# Patient Record
Sex: Male | Born: 1945 | Race: White | Hispanic: No | Marital: Married | Smoking: Former smoker
Health system: Southern US, Community
[De-identification: ages and names within clinical notes are randomized; demographics above are authoritative.]

## PROBLEM LIST (undated history)

## (undated) ENCOUNTER — Ambulatory Visit (HOSPITAL_BASED_OUTPATIENT_CLINIC_OR_DEPARTMENT_OTHER): Source: Home / Self Care

## (undated) DIAGNOSIS — N281 Cyst of kidney, acquired: Secondary | ICD-10-CM

## (undated) DIAGNOSIS — I739 Peripheral vascular disease, unspecified: Secondary | ICD-10-CM

## (undated) DIAGNOSIS — C44301 Unspecified malignant neoplasm of skin of nose: Secondary | ICD-10-CM

## (undated) DIAGNOSIS — J449 Chronic obstructive pulmonary disease, unspecified: Secondary | ICD-10-CM

## (undated) DIAGNOSIS — M1990 Unspecified osteoarthritis, unspecified site: Secondary | ICD-10-CM

## (undated) DIAGNOSIS — J45909 Unspecified asthma, uncomplicated: Secondary | ICD-10-CM

## (undated) DIAGNOSIS — E7800 Pure hypercholesterolemia, unspecified: Secondary | ICD-10-CM

## (undated) DIAGNOSIS — I2510 Atherosclerotic heart disease of native coronary artery without angina pectoris: Secondary | ICD-10-CM

## (undated) DIAGNOSIS — E538 Deficiency of other specified B group vitamins: Secondary | ICD-10-CM

## (undated) HISTORY — PX: OTHER SURGICAL HISTORY: SHX169

## (undated) HISTORY — PX: NO PAST SURGERIES: SHX2092

## (undated) HISTORY — PX: COLONOSCOPY W/ POLYPECTOMY: SHX1380

## (undated) MED FILL — Etoposide Inj 1 GM/50ML (20 MG/ML): INTRAVENOUS | Qty: 10 | Status: AC

---
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
## 2013-06-26 ENCOUNTER — Ambulatory Visit (INDEPENDENT_AMBULATORY_CARE_PROVIDER_SITE_OTHER): Payer: Medicare Other | Admitting: Internal Medicine

## 2013-06-26 ENCOUNTER — Encounter: Payer: Self-pay | Admitting: Internal Medicine

## 2013-06-26 VITALS — BP 128/66 | HR 61 | Temp 98.2°F | Ht 68.0 in | Wt 187.8 lb

## 2013-06-26 DIAGNOSIS — J45909 Unspecified asthma, uncomplicated: Secondary | ICD-10-CM

## 2013-06-26 DIAGNOSIS — J4520 Mild intermittent asthma, uncomplicated: Secondary | ICD-10-CM

## 2013-06-26 MED ORDER — BUDESONIDE-FORMOTEROL FUMARATE 160-4.5 MCG/ACT IN AERO: INHALATION_SPRAY | RESPIRATORY_TRACT | Status: DC

## 2013-06-26 NOTE — Patient Instructions (Addendum)
symbicort 160 Take 2 puffs first thing in am and then another 2 puffs about 12 hours later.   Only use your albuterol (ventolin) as a rescue medication to be used if you can't catch your breath by resting or doing a relaxed purse lip breathing pattern. The less you use it, the better it will work when you need it. Ok to use up to every 4h but the goal is not to need it at all  Work on inhaler technique:  relax and gently blow all the way out then take a nice smooth deep breath back in, triggering the inhaler at same time you start breathing in.  Hold for up to 5 seconds if you can.  Rinse and gargle with water when done  Finish prednisone  Try prilosec 20mg   Take 30-60 min before first meal of the day and Pepcid 20 mg one bedtime until cough is completely gone  GERD (REFLUX)  is an extremely common cause of respiratory symptoms, many times with no significant heartburn at all.    It can be treated with medication, but also with lifestyle changes including avoidance of late meals, excessive alcohol, smoking cessation, and avoid fatty foods, chocolate, peppermint, colas, red wine, and acidic juices such as orange juice.  NO MINT OR MENTHOL PRODUCTS SO NO COUGH DROPS  USE SUGARLESS CANDY INSTEAD (jolley ranchers or Stover's)  NO OIL BASED VITAMINS - use powdered substitutes.   Please schedule a follow up office visit in 2 weeks, sooner if needed to see Tammy with all your medications and then she'll set up a visit with PFT's

## 2013-06-26 NOTE — Progress Notes (Signed)
  Subjective:    Patient ID: Manuel Sanchez, male    DOB: Nov 23, 1945, 67 y.o.   MRN: [id number]  HPI  67 yowm quit smoking 2004 with variable sob since jan 2014 referred to Pulmonary clinic 06/26/2013 by Dr Gillis Ends.  Primary is Dr Shary Decamp  06/26/2013 1st pulmonary cc dx flu  at [facility] admit x 3 days in January did ok but still felt needed occ saba x yardwork or uphill walking  Then abruptly  8/2 after worked hard on roof p went to bed that night developed  bad cough, clear mucus , no better with saba > to UC  Sun 8/3 10 am rx zpak, pred, saba q4h until wheezing stops last used 3 am day of ov p woke up coughing.  No excess or purulent sputum.  Sob just with exertion or cough  No obvious daytime variabilty or assoc  r cp or chest tightness, subjective wheeze overt sinus or hb symptoms. No unusual exp hx or h/o childhood pna/ asthma or knowledge of premature birth.   Sleeping ok without nocturnal  or early am exacerbation  of respiratory  c/o's or need for noct saba. Also denies any obvious fluctuation of symptoms with weather or environmental changes or other aggravating or alleviating factors except as outlined above   Review of Systems  Constitutional: Negative for fever, chills, activity change, appetite change and unexpected weight change.  HENT: Negative for congestion, sore throat, rhinorrhea, sneezing, trouble swallowing, dental problem, voice change and postnasal drip.   Eyes: Negative for visual disturbance.  Respiratory: Positive for cough and shortness of breath. Negative for choking.   Cardiovascular: Negative for chest pain and leg swelling.  Gastrointestinal: Negative for nausea, vomiting and abdominal pain.  Genitourinary: Negative for difficulty urinating.  Musculoskeletal: Negative for arthralgias.  Skin: Negative for rash.  Psychiatric/Behavioral: Negative for behavioral problems and confusion.       Objective:   Physical Exam   Amb wm nad  Wt Readings from Last 3  Encounters:  06/26/13 187 lb 12.8 oz (85.186 kg)    HEENT mild turbinate edema.  Oropharynx no thrush or excess pnd or cobblestoning.  No JVD or cervical adenopathy. Mild accessory muscle hypertrophy. Trachea midline, nl thryroid. Chest was hyperinflated by percussion with diminished breath sounds and moderate increased exp time with mid bilateral exp wheeze. Hoover sign positive at mid inspiration. Regular rate and rhythm without murmur gallop or rub or increase P2 or edema.  Abd: no hsm, nl excursion. Ext warm without cyanosis or clubbing.       Assessment & Plan:

## 2013-06-29 DIAGNOSIS — J45909 Unspecified asthma, uncomplicated: Secondary | ICD-10-CM | POA: Insufficient documentation

## 2013-06-29 HISTORY — DX: Unspecified asthma, uncomplicated: J45.909

## 2013-06-29 NOTE — Assessment & Plan Note (Signed)
Appears to have developed asthmatic bronchitis as result of "flu" in Jan 2014 just treated to date with saba but actively wheezing in office  DDX of  difficult airways managment all start with A and  include Adherence, Ace Inhibitors, Acid Reflux, Active Sinus Disease, Alpha 1 Antitripsin deficiency, Anxiety masquerading as Airways dz,  ABPA,  allergy(esp in young), Aspiration (esp in elderly), Adverse effects of DPI,  Active smokers, plus two Bs  = Bronchiectasis and Beta blocker use..and one C= CHF  Adherence is always the initial "prime suspect" and is a multilayered concern that requires a "trust but verify" approach in every patient - starting with knowing how to use medications, especially inhalers, correctly, keeping up with refills and understanding the fundamental difference between maintenance and prns vs those medications only taken for a very short course and then stopped and not refilled. The proper method of use, as well as anticipated side effects, of a metered-dose inhaler are discussed and demonstrated to the patient. Improved effectiveness after extensive coaching during this visit to a level of approximately  75% so start symbicort 160 2 bid pending f/u pfts   ? Acid reflux contributing, esp with such severe noct cough > max rx and diet reviewed

## 2013-07-10 ENCOUNTER — Encounter: Payer: Medicare Other | Admitting: Adult Health

## 2013-07-22 ENCOUNTER — Encounter: Payer: Self-pay | Admitting: Adult Health

## 2013-07-22 ENCOUNTER — Ambulatory Visit (INDEPENDENT_AMBULATORY_CARE_PROVIDER_SITE_OTHER): Payer: Medicare Other | Admitting: Adult Health

## 2013-07-22 VITALS — BP 122/72 | HR 58 | Temp 97.2°F | Ht 68.0 in | Wt 185.8 lb

## 2013-07-22 DIAGNOSIS — J45909 Unspecified asthma, uncomplicated: Secondary | ICD-10-CM

## 2013-07-22 NOTE — Assessment & Plan Note (Signed)
Improved control on Symbicort  Patient's medications were reviewed today and patient education was given. Computerized medication calendar was adjusted/completed  Plan  Cont on current regimen  follow up in 3-4 weeks with PFT

## 2013-07-22 NOTE — Progress Notes (Signed)
  Subjective:    Patient ID: Manuel Sanchez, male    DOB: 06-Nov-1946, 67 y.o.   MRN: [id number]  HPI 87 yowm quit smoking 2004 with variable sob since jan 2014 referred to Pulmonary clinic 06/26/2013 by Dr Gillis Ends.  Primary is Dr Shary Decamp  06/26/2013 1st pulmonary cc dx flu  at [facility] admit x 3 days in January did ok but still felt needed occ saba x yardwork or uphill walking  Then abruptly  8/2 after worked hard on roof p went to bed that night developed  bad cough, clear mucus , no better with saba > to UC  Sun 8/3 10 am rx zpak, pred, saba q4h until wheezing stops last used 3 am day of ov p woke up coughing.  No excess or purulent sputum.  Sob just with exertion or cough >>symbicort , PPI/pepcid   07/22/2013 Follow up and Med review  Patient returns for 1 month followup and medication review. Reviewed all his medications. Organized them into a medication calendar with patient education. Appears that he is taking his medications correctly. Patient was seen one month ago for increased shortness, of breath, felt to have some asthmatic flare. He was started on Symbicort , along with a, GERD, prevention regimen. Since last visit. Patient is feeling better, cough is much better. Wheezing has resolved. Able to do more activities outside.   Review of Systems  Constitutional: Negative for fever, chills, activity change, appetite change and unexpected weight change.  HENT: Negative for congestion, sore throat, rhinorrhea, sneezing, trouble swallowing, dental problem, voice change and postnasal drip.   Eyes: Negative for visual disturbance.  Respiratory: Neg  Negative for choking.   Cardiovascular: Negative for chest pain and leg swelling.  Gastrointestinal: Negative for nausea, vomiting and abdominal pain.  Genitourinary: Negative for difficulty urinating.  Musculoskeletal: Negative for arthralgias.  Skin: Negative for rash.  Psychiatric/Behavioral: Negative for behavioral problems and confusion.        Objective:   Physical Exam   Amb wm nad HEENT mild turbinate edema.  Oropharynx no thrush or excess pnd or cobblestoning.  No JVD or cervical adenopathy. Mild accessory muscle hypertrophy. Trachea midline, nl thryroid. Chest was hyperinflated by percussion with diminished breath sounds and moderate increased exp time with no wheezing . Hoover sign positive at mid inspiration. Regular rate and rhythm without murmur gallop or rub or increase P2 or edema.  Abd: no hsm, nl excursion. Ext warm without cyanosis or clubbing.       Assessment & Plan:

## 2013-07-22 NOTE — Patient Instructions (Addendum)
Follow medication calendar closely and bring to each visit. Return with Dr. Sherene Sires in 3-4 weeks with pulmonary function test

## 2013-07-22 NOTE — Addendum Note (Signed)
Addended by: Boone Master E on: 07/22/2013 02:54 PM   Modules accepted: Orders

## 2013-08-21 ENCOUNTER — Ambulatory Visit (INDEPENDENT_AMBULATORY_CARE_PROVIDER_SITE_OTHER): Payer: Medicare Other | Admitting: Internal Medicine

## 2013-08-21 ENCOUNTER — Encounter: Payer: Self-pay | Admitting: Internal Medicine

## 2013-08-21 VITALS — BP 122/60 | HR 60 | Temp 97.4°F | Ht 67.0 in | Wt 190.0 lb

## 2013-08-21 DIAGNOSIS — J45909 Unspecified asthma, uncomplicated: Secondary | ICD-10-CM

## 2013-08-21 LAB — PULMONARY FUNCTION TEST

## 2013-08-21 NOTE — Assessment & Plan Note (Addendum)
-   PFTs 08/21/2013 FEV1  2.52 (82%) ratio 78 and no no change p B2, DLCO 65 corrects to 83%   The proper method of use, as well as anticipated side effects, of a metered-dose inhaler are discussed and demonstrated to the patient. Improved effectiveness after extensive coaching during this visit to a level of approximately  75%  All goals of chronic asthma control met including optimal function and elimination of symptoms with minimal need for rescue therapy on symbicort 160 2 puffs each am and rx for gerd.  Given that his mdi baseline is so poor the gerd rx may be the bigger factor and he should be able to taper the symbicort    Each maintenance medication was reviewed in detail including most importantly the difference between maintenance and as needed and under what circumstances the prns are to be used.  Please see instructions for details which were reviewed in writing and the patient given a copy.    Contingencies discussed in full including contacting this office immediately if not controlling the symptoms using the rule of two's.     Pulmonary f/u is prn

## 2013-08-21 NOTE — Patient Instructions (Addendum)
Ok to reduce the symbicort 160 2 puffs every 12 hours if any respiratory problems  Only use your albuterol as a rescue medication to be used if you can't catch your breath by resting or doing a relaxed purse lip breathing pattern.  - The less you use it, the better it will work when you need it. - Ok to use up to every 4 hours if you must but call for immediate appointment if use goes up over your usual need - Don't leave home without it !!  (think of it like your spare tire for your car)    If you are satisfied with your treatment plan let your doctor know and he/she can either refill your medications or you can return here when your prescription runs out.     If in any way you are not 100% satisfied,  please tell us.  If 100% better, tell your friends!

## 2013-08-21 NOTE — Progress Notes (Signed)
PFT done today. 

## 2013-08-21 NOTE — Progress Notes (Signed)
Subjective:    Patient ID: Manuel Sanchez, male    DOB: 20-Jun-1946  MRN: [id number]  HPI 45 yowm quit smoking 2004 with variable sob since jan 2014 referred to Pulmonary clinic 06/26/2013 by Dr Gillis Ends.  Primary is Dr Shary Decamp  06/26/2013 1st pulmonary cc dx flu  at [facility] admit x 3 days in January did ok but still felt needed occ saba x yardwork or uphill walking  Then abruptly  8/2 after worked hard on roof p went to bed that night developed  bad cough, clear mucus , no better with saba > to UC  Sun 8/3 10 am rx zpak, pred, saba q4h until wheezing stops last used 3 am day of ov p woke up coughing.  No excess or purulent sputum.  Sob just with exertion or cough >>symbicort , PPI/pepcid   07/22/2013 Follow up and Med review  Patient returns for 1 month followup and medication review. Reviewed all his medications. Organized them into a medication calendar with patient education. Appears that he is taking his medications correctly. Patient was seen one month ago for increased shortness, of breath, felt to have some asthmatic flare. He was started on Symbicort , along with a, GERD, prevention regimen. Since last visit. Patient is feeling better, cough is much better. Wheezing has resolved. Able to do more activities outside.  rec No change rx, follow med calendar  08/21/2013 f/u ov/[facility] re: asthma much better - has med calendar but not following it.     He is actually just using the symbicort each am, not the ventolin at all.  No obvious day to day or daytime variabilty or assoc chronic cough or cp or chest tightness, subjective wheeze overt sinus or hb symptoms. No unusual exp hx or h/o childhood pna/ asthma or knowledge of premature birth.  Sleeping ok without nocturnal  or early am exacerbation  of respiratory  c/o's or need for noct saba. Also denies any obvious fluctuation of symptoms with weather or environmental changes or other aggravating or alleviating factors except as outlined above    Current Medications, Allergies, Complete Past Medical History, Past Surgical History, Family History, and Social History were reviewed in [employer] record.  ROS  The following are not active complaints unless bolded sore throat, dysphagia, dental problems, itching, sneezing,  nasal congestion or excess/ purulent secretions, ear ache,   fever, chills, sweats, unintended wt loss, pleuritic or exertional cp, hemoptysis,  orthopnea pnd or leg swelling, presyncope, palpitations, heartburn, abdominal pain, anorexia, nausea, vomiting, diarrhea  or change in bowel or urinary habits, change in stools or urine, dysuria,hematuria,  rash, arthralgias, visual complaints, headache, numbness weakness or ataxia or problems with walking or coordination,  change in mood/affect or memory.          Objective:   Physical Exam   Wt Readings from Last 3 Encounters:  08/21/13 190 lb (86.183 kg)  07/22/13 185 lb 12.8 oz (84.278 kg)  06/26/13 187 lb 12.8 oz (85.186 kg)      HEENT: nl dentition, turbinates, and orophanx. Nl external ear canals without cough reflex   NECK :  without JVD/Nodes/TM/ nl carotid upstrokes bilaterally   LUNGS: no acc muscle use, clear to A and P bilaterally without cough on insp or exp maneuvers   CV:  RRR  no s3 or murmur or increase in P2, no edema   ABD:  soft and nontender with nl excursion in the supine position. No bruits or organomegaly, bowel sounds  nl  MS:  warm without deformities, calf tenderness, cyanosis or clubbing  SKIN: warm and dry without lesions    NEURO:  alert, approp, no deficits            Assessment & Plan:

## 2015-04-22 DIAGNOSIS — J410 Simple chronic bronchitis: Secondary | ICD-10-CM

## 2015-04-22 DIAGNOSIS — E785 Hyperlipidemia, unspecified: Secondary | ICD-10-CM

## 2015-04-22 DIAGNOSIS — I2510 Atherosclerotic heart disease of native coronary artery without angina pectoris: Secondary | ICD-10-CM | POA: Insufficient documentation

## 2015-04-22 HISTORY — DX: Atherosclerotic heart disease of native coronary artery without angina pectoris: I25.10

## 2015-04-22 HISTORY — DX: Hyperlipidemia, unspecified: E78.5

## 2015-04-22 HISTORY — DX: Simple chronic bronchitis: J41.0

## 2016-04-24 DIAGNOSIS — R5383 Other fatigue: Secondary | ICD-10-CM | POA: Insufficient documentation

## 2016-04-24 DIAGNOSIS — E7800 Pure hypercholesterolemia, unspecified: Secondary | ICD-10-CM | POA: Insufficient documentation

## 2016-04-24 DIAGNOSIS — R5381 Other malaise: Secondary | ICD-10-CM

## 2016-04-24 DIAGNOSIS — Z79899 Other long term (current) drug therapy: Secondary | ICD-10-CM

## 2016-04-24 DIAGNOSIS — E66811 Obesity, class 1: Secondary | ICD-10-CM

## 2016-04-24 DIAGNOSIS — I739 Peripheral vascular disease, unspecified: Secondary | ICD-10-CM | POA: Insufficient documentation

## 2016-04-24 DIAGNOSIS — E6609 Other obesity due to excess calories: Secondary | ICD-10-CM | POA: Insufficient documentation

## 2016-04-24 DIAGNOSIS — Z6830 Body mass index (BMI) 30.0-30.9, adult: Secondary | ICD-10-CM

## 2016-04-24 DIAGNOSIS — N281 Cyst of kidney, acquired: Secondary | ICD-10-CM | POA: Insufficient documentation

## 2016-04-24 DIAGNOSIS — R7303 Prediabetes: Secondary | ICD-10-CM

## 2016-04-24 DIAGNOSIS — E538 Deficiency of other specified B group vitamins: Secondary | ICD-10-CM | POA: Insufficient documentation

## 2016-04-24 HISTORY — DX: Other malaise: R53.81

## 2016-04-24 HISTORY — DX: Other fatigue: R53.83

## 2016-04-24 HISTORY — DX: Other obesity due to excess calories: E66.09

## 2016-04-24 HISTORY — DX: Prediabetes: R73.03

## 2016-04-24 HISTORY — DX: Peripheral vascular disease, unspecified: I73.9

## 2016-04-24 HISTORY — DX: Other long term (current) drug therapy: Z79.899

## 2016-04-24 HISTORY — DX: Body mass index (BMI) 30.0-30.9, adult: Z68.30

## 2016-04-24 HISTORY — DX: Obesity, class 1: E66.811

## 2016-08-23 DIAGNOSIS — J449 Chronic obstructive pulmonary disease, unspecified: Secondary | ICD-10-CM | POA: Insufficient documentation

## 2016-08-23 HISTORY — DX: Chronic obstructive pulmonary disease, unspecified: J44.9

## 2017-05-18 DIAGNOSIS — M19011 Primary osteoarthritis, right shoulder: Secondary | ICD-10-CM

## 2017-05-18 HISTORY — DX: Primary osteoarthritis, right shoulder: M19.011

## 2018-02-13 DIAGNOSIS — C44301 Unspecified malignant neoplasm of skin of nose: Secondary | ICD-10-CM | POA: Insufficient documentation

## 2018-06-03 ENCOUNTER — Encounter: Payer: Self-pay | Admitting: Gastroenterology

## 2019-07-23 ENCOUNTER — Other Ambulatory Visit: Payer: Self-pay

## 2019-07-23 ENCOUNTER — Encounter: Payer: Self-pay | Admitting: *Deleted

## 2019-07-23 ENCOUNTER — Ambulatory Visit (INDEPENDENT_AMBULATORY_CARE_PROVIDER_SITE_OTHER): Payer: Medicare HMO | Admitting: Cardiology

## 2019-07-23 VITALS — BP 110/60 | HR 76 | Ht 67.0 in | Wt 189.0 lb

## 2019-07-23 DIAGNOSIS — N281 Cyst of kidney, acquired: Secondary | ICD-10-CM | POA: Insufficient documentation

## 2019-07-23 DIAGNOSIS — J449 Chronic obstructive pulmonary disease, unspecified: Secondary | ICD-10-CM

## 2019-07-23 DIAGNOSIS — E538 Deficiency of other specified B group vitamins: Secondary | ICD-10-CM

## 2019-07-23 DIAGNOSIS — J431 Panlobular emphysema: Secondary | ICD-10-CM | POA: Diagnosis not present

## 2019-07-23 DIAGNOSIS — I2510 Atherosclerotic heart disease of native coronary artery without angina pectoris: Secondary | ICD-10-CM

## 2019-07-23 DIAGNOSIS — C44301 Unspecified malignant neoplasm of skin of nose: Secondary | ICD-10-CM | POA: Insufficient documentation

## 2019-07-23 DIAGNOSIS — I739 Peripheral vascular disease, unspecified: Secondary | ICD-10-CM | POA: Insufficient documentation

## 2019-07-23 DIAGNOSIS — M1990 Unspecified osteoarthritis, unspecified site: Secondary | ICD-10-CM

## 2019-07-23 DIAGNOSIS — Z87891 Personal history of nicotine dependence: Secondary | ICD-10-CM

## 2019-07-23 DIAGNOSIS — E7800 Pure hypercholesterolemia, unspecified: Secondary | ICD-10-CM

## 2019-07-23 DIAGNOSIS — R0789 Other chest pain: Secondary | ICD-10-CM

## 2019-07-23 HISTORY — DX: Other chest pain: R07.89

## 2019-07-23 HISTORY — DX: Deficiency of other specified B group vitamins: E53.8

## 2019-07-23 HISTORY — DX: Chronic obstructive pulmonary disease, unspecified: J44.9

## 2019-07-23 HISTORY — DX: Unspecified malignant neoplasm of skin of nose: C44.301

## 2019-07-23 HISTORY — DX: Atherosclerotic heart disease of native coronary artery without angina pectoris: I25.10

## 2019-07-23 HISTORY — DX: Unspecified osteoarthritis, unspecified site: M19.90

## 2019-07-23 HISTORY — DX: Peripheral vascular disease, unspecified: I73.9

## 2019-07-23 HISTORY — DX: Cyst of kidney, acquired: N28.1

## 2019-07-23 HISTORY — DX: Pure hypercholesterolemia, unspecified: E78.00

## 2019-07-23 HISTORY — DX: Personal history of nicotine dependence: Z87.891

## 2019-07-23 NOTE — Progress Notes (Signed)
Cardiology Office Note:    Date:  07/23/2019   ID:  Manuel Sanchez, DOB Nov 30, 1945, MRN [id number]  PCP:  Raina Mina., MD  Cardiologist:  Jenean Lindau, MD   Referring MD: Gweneth Fritter, FNP    ASSESSMENT:    1. Panlobular emphysema (HCC)   2. Chest discomfort   3. Ex-smoker    PLAN:    In order of problems listed above:  1. Primary prevention stressed with the patient.  Importance of compliance with diet and medication stressed and he vocalized understanding. 2. Chest discomfort: Atypical in nature however in view of risk factors we will do a Lexiscan sestamibi.  He knows to go to the nearest emergency room for any concerning symptoms. 3. COPD and history of smoking: I advised the patient never to go back to smoking and he promises never to do so.Patient will be seen in follow-up appointment in 6 months or earlier if the patient has any concerns    Medication Adjustments/Labs and Tests Ordered: Current medicines are reviewed at length with the patient today.  Concerns regarding medicines are outlined above.  No orders of the defined types were placed in this encounter.  No orders of the defined types were placed in this encounter.    History of Present Illness:    Manuel Sanchez is a 73 y.o. male who is being seen today for the evaluation of this discomfort at the request of Goins, Gillis Santa, FNP.  Patient is a pleasant 73 year old male.  He has past medical history of COPD.  Patient is referred here for chest discomfort.  He mentions to me that he had stabbing like sensation a few days ago.  This was not related to exertion.  No radiation to the neck or to the arms.  Overall he leads a sedentary lifestyle but walks about half a mile without any symptoms.  At the time of my evaluation, the patient is alert awake oriented and in no distress.  Past Medical History:  Diagnosis Date  . Arthritis 07/23/2019   Rt acromioclavicular joint  . CAD (coronary artery disease)  07/23/2019  . COPD (chronic obstructive pulmonary disease) ([place]) 07/23/2019  . Cyst of left kidney 07/23/2019  . Hypercholesterolemia 07/23/2019  . 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 Peripheral vascular disease, unspecified ([place]) 07/23/2019  . Skin cancer of nose 07/23/2019  . Vitamin B12 deficiency 07/23/2019    History reviewed. No pertinent surgical history.  Current Medications: Current Meds  Medication Sig  . albuterol (VENTOLIN HFA) 108 (90 BASE) MCG/ACT inhaler Inhale 2 puffs into the lungs every 4 (four) hours as needed for wheezing or shortness of breath.   Marland Kitchen atorvastatin (LIPITOR) 20 MG tablet Take 20 mg by mouth daily.  . budesonide-formoterol (SYMBICORT) 160-4.5 MCG/ACT inhaler Take 2 puffs first thing in am and then another 2 puffs about 12 hours later.  . cholecalciferol (VITAMIN D) 1000 UNITS tablet Take 1,000 Units by mouth daily.  . Cyanocobalamin (B-12) 2500 MCG TABS Take 1 tablet by mouth every morning.  . montelukast (SINGULAIR) 10 MG tablet Take 10 mg by mouth daily.     Allergies:   Codeine and Levaquin [levofloxacin in d5w]   Social History   Socioeconomic History  . Marital status: Married    Spouse name:  Not on file  . Number of children: 4  . Years of education: Not on file  . Highest education level: Not on file  Occupational History  . Occupation: Retired  Scientific laboratory technician  . Financial resource strain: Not on file  . Food insecurity    Worry: Not on file    Inability: Not on file  . Transportation needs    Medical: Not on file    Non-medical: Not on file  Tobacco Use  . Smoking status: Former Smoker    Packs/day: 0.75    Years: 45.00    Pack years: 33.75    Types: Cigarettes    Quit date: 11/20/2002    Years since quitting: 16.6  . Smokeless tobacco: Never Used  Substance and Sexual Activity   . Alcohol use: No  . Drug use: No  . Sexual activity: Not on file  Lifestyle  . Physical activity    Days per week: Not on file    Minutes per session: Not on file  . Stress: Not on file  Relationships  . Social Herbalist on phone: Not on file    Gets together: Not on file    Attends religious service: Not on file    Active member of club or organization: Not on file    Attends meetings of clubs or organizations: Not on file    Relationship status: Not on file  Other Topics Concern  . Not on file  Social History Narrative  . Not on file     Family History: The patient's family history includes Colon cancer in his father; Heart disease in his mother; Stroke in his mother.  ROS:   Please see the history of present illness.    All other systems reviewed and are negative.  EKGs/Labs/Other Studies Reviewed:    The following studies were reviewed today: EKG reveals sinus rhythm and nonspecific ST changes   Recent Labs: No results found for requested labs within last 8760 hours.  Recent Lipid Panel No results found for: CHOL, TRIG, HDL, CHOLHDL, VLDL, LDLCALC, LDLDIRECT  Physical Exam:    VS:  BP 110/60 (BP Location: Right Arm, Patient Position: Sitting, Cuff Size: Normal)   Pulse 76   Ht 5\' 7"  (1.702 m)   Wt 189 lb (85.7 kg)   SpO2 96%   BMI 29.60 kg/m     Wt Readings from Last 3 Encounters:  07/23/19 189 lb (85.7 kg)  08/21/13 190 lb (86.2 kg)  07/22/13 185 lb 12.8 oz (84.3 kg)     GEN: Patient is in no acute distress HEENT: Normal NECK: No JVD; No carotid bruits LYMPHATICS: No lymphadenopathy CARDIAC: S1 S2 regular, 2/6 systolic murmur at the apex. RESPIRATORY:  Clear to auscultation without rales, wheezing or rhonchi  ABDOMEN: Soft, non-tender, non-distended MUSCULOSKELETAL:  No edema; No deformity  SKIN: Warm and dry NEUROLOGIC:  Alert and oriented x 3 PSYCHIATRIC:  Normal affect    Signed, Jenean Lindau, MD  07/23/2019 11:58 AM     [place]

## 2019-07-23 NOTE — Patient Instructions (Signed)
Medication Instructions:  Your physician recommends that you continue on your current medications as directed. Please refer to the Current Medication list given to you today.  If you need a refill on your cardiac medications before your next appointment, please call your pharmacy.   Lab work: NONE If you have labs (blood work) drawn today and your tests are completely normal, you will receive your results only by: . MyChart Message (if you have MyChart) OR . A paper copy in the mail If you have any lab test that is abnormal or we need to change your treatment, we will call you to review the results.  Testing/Procedures: You had an EKG performed today  Your physician has requested that you have a lexiscan myoview. For further information please visit [email or web address]. Please follow instruction sheet, as given.    Follow-Up: At [facility], you and your health needs are our priority.  As part of our continuing mission to provide you with exceptional heart care, we have created designated Provider Care Teams.  These Care Teams include your primary Cardiologist (physician) and Advanced Practice Providers (APPs -  Physician Assistants and Nurse Practitioners) who all work together to provide you with the care you need, when you need it. You will need a follow up appointment in 6 months.   Any Other Special Instructions Will Be Listed Below  Regadenoson injection What is this medicine? REGADENOSON is used to test the heart for coronary artery disease. It is used in patients who can not exercise for their stress test. This medicine may be used for other purposes; ask your health care provider or pharmacist if you have questions. COMMON BRAND NAME(S): Lexiscan What should I tell my health care provider before I take this medicine? They need to know if you have any of these conditions:  heart problems  lung or breathing disease, like asthma or COPD  an unusual or allergic reaction to  regadenoson, other medicines, foods, dyes, or preservatives  pregnant or trying to get pregnant  breast-feeding How should I use this medicine? This medicine is for injection into a vein. It is given by a health care professional in a hospital or clinic setting. Talk to your pediatrician regarding the use of this medicine in children. Special care may be needed. Overdosage: If you think you have taken too much of this medicine contact a poison control center or emergency room at once. NOTE: This medicine is only for you. Do not share this medicine with others. What if I miss a dose? This does not apply. What may interact with this medicine?  caffeine  dipyridamole  guarana  theophylline This list may not describe all possible interactions. Give your health care provider a list of all the medicines, herbs, non-prescription drugs, or dietary supplements you use. Also tell them if you smoke, drink alcohol, or use illegal drugs. Some items may interact with your medicine. What should I watch for while using this medicine? Your condition will be monitored carefully while you are receiving this medicine. Do not take medicines, foods, or drinks with caffeine (like coffee, tea, or colas) for at least 12 hours before your test. If you do not know if something contains caffeine, ask your health care professional. What side effects may I notice from receiving this medicine? Side effects that you should report to your doctor or health care professional as soon as possible:  allergic reactions like skin rash, itching or hives, swelling of the face, lips, or tongue  breathing   problems  chest pain, tightness or palpitations  severe headache Side effects that usually do not require medical attention (report to your doctor or health care professional if they continue or are bothersome):  flushing  headache  irritation or pain at site where injected  nausea, vomiting This list may not  describe all possible side effects. Call your doctor for medical advice about side effects. You may report side effects to FDA at 1-800-FDA-1088. Where should I keep my medicine? This drug is given in a hospital or clinic and will not be stored at home. NOTE: This sheet is a summary. It may not cover all possible information. If you have questions about this medicine, talk to your doctor, pharmacist, or health care provider.  2020 Elsevier/Gold Standard (2008-07-06 15:08:13)   Cardiac Nuclear Scan A cardiac nuclear scan is a test that is done to check the flow of blood to your heart. It is done when you are resting and when you are exercising. The test looks for problems such as:  Not enough blood reaching a portion of the heart.  The heart muscle not working as it should. You may need this test if:  You have heart disease.  You have had lab results that are not normal.  You have had heart surgery or a balloon procedure to open up blocked arteries (angioplasty).  You have chest pain.  You have shortness of breath. In this test, a special dye (tracer) is put into your bloodstream. The tracer will travel to your heart. A camera will then take pictures of your heart to see how the tracer moves through your heart. This test is usually done at a hospital and takes 2-4 hours. Tell a doctor about:  Any allergies you have.  All medicines you are taking, including vitamins, herbs, eye drops, creams, and over-the-counter medicines.  Any problems you or family members have had with anesthetic medicines.  Any blood disorders you have.  Any surgeries you have had.  Any medical conditions you have.  Whether you are pregnant or may be pregnant. What are the risks? Generally, this is a safe test. However, problems may occur, such as:  Serious chest pain and heart attack. This is only a risk if the stress portion of the test is done.  Rapid heartbeat.  A feeling of warmth in your  chest. This feeling usually does not last long.  Allergic reaction to the tracer. What happens before the test?  Ask your doctor about changing or stopping your normal medicines. This is important.  Follow instructions from your doctor about what you cannot eat or drink.  Remove your jewelry on the day of the test. What happens during the test?  An IV tube will be inserted into one of your veins.  Your doctor will give you a small amount of tracer through the IV tube.  You will wait for 20-40 minutes while the tracer moves through your bloodstream.  Your heart will be monitored with an electrocardiogram (ECG).  You will lie down on an exam table.  Pictures of your heart will be taken for about 15-20 minutes.  You may also have a stress test. For this test, one of these things may be done: ? You will be asked to exercise on a treadmill or a stationary bike. ? You will be given medicines that will make your heart work harder. This is done if you are unable to exercise.  When blood flow to your heart has peaked, a   tracer will again be given through the IV tube.  After 20-40 minutes, you will get back on the exam table. More pictures will be taken of your heart.  Depending on the tracer that is used, more pictures may need to be taken 3-4 hours later.  Your IV tube will be removed when the test is over. The test may vary among doctors and hospitals. What happens after the test?  Ask your doctor: ? Whether you can return to your normal schedule, including diet, activities, and medicines. ? Whether you should drink more fluids. This will help to remove the tracer from your body. Drink enough fluid to keep your pee (urine) pale yellow.  Ask your doctor, or the department that is doing the test: ? When will my results be ready? ? How will I get my results? Summary  A cardiac nuclear scan is a test that is done to check the flow of blood to your heart.  Tell your doctor  whether you are pregnant or may be pregnant.  Before the test, ask your doctor about changing or stopping your normal medicines. This is important.  Ask your doctor whether you can return to your normal activities. You may be asked to drink more fluids. This information is not intended to replace advice given to you by your health care provider. Make sure you discuss any questions you have with your health care provider. Document Released: 04/22/2018 Document Revised: 02/26/2019 Document Reviewed: 04/22/2018 Elsevier Patient Education  2020 Elsevier Inc.  

## 2019-08-20 ENCOUNTER — Telehealth (HOSPITAL_COMMUNITY): Payer: Self-pay | Admitting: *Deleted

## 2019-08-20 NOTE — Telephone Encounter (Signed)
Patient given detailed instructions per Myocardial Perfusion Study Information Sheet for the test on 08/26/19. Patient notified to arrive 15 minutes early and that it is imperative to arrive on time for appointment to keep from having the test rescheduled.  If you need to cancel or reschedule your appointment, please call the office within 24 hours of your appointment. . Patient verbalized understanding.Kirstie Peri

## 2019-09-03 ENCOUNTER — Telehealth (HOSPITAL_COMMUNITY): Payer: Self-pay | Admitting: *Deleted

## 2019-09-03 NOTE — Telephone Encounter (Signed)
Patient given detailed instructions per Myocardial Perfusion Study Information Sheet for the test on 09/11/19. Patient notified to arrive 15 minutes early and that it is imperative to arrive on time for appointment to keep from having the test rescheduled.  If you need to cancel or reschedule your appointment, please call the office within 24 hours of your appointment. . Patient verbalized understanding. Kirstie Peri

## 2019-09-10 DIAGNOSIS — D509 Iron deficiency anemia, unspecified: Secondary | ICD-10-CM

## 2019-09-10 DIAGNOSIS — L308 Other specified dermatitis: Secondary | ICD-10-CM | POA: Insufficient documentation

## 2019-09-10 DIAGNOSIS — R918 Other nonspecific abnormal finding of lung field: Secondary | ICD-10-CM | POA: Insufficient documentation

## 2019-09-10 HISTORY — DX: Iron deficiency anemia, unspecified: D50.9

## 2019-09-10 HISTORY — DX: Other specified dermatitis: L30.8

## 2019-09-10 HISTORY — DX: Other nonspecific abnormal finding of lung field: R91.8

## 2019-09-11 ENCOUNTER — Ambulatory Visit (INDEPENDENT_AMBULATORY_CARE_PROVIDER_SITE_OTHER): Payer: Medicare HMO

## 2019-09-11 ENCOUNTER — Other Ambulatory Visit: Payer: Self-pay

## 2019-09-11 DIAGNOSIS — R0789 Other chest pain: Secondary | ICD-10-CM

## 2019-09-11 LAB — MYOCARDIAL PERFUSION IMAGING
LV dias vol: 97 mL (ref 62–150)
LV sys vol: 41 mL
Peak HR: 78 {beats}/min
Rest HR: 54 {beats}/min
SDS: 3
SRS: 1
SSS: 4
TID: 1.17

## 2019-09-11 MED ORDER — REGADENOSON 0.4 MG/5ML IV SOLN
0.4000 mg | Freq: Once | INTRAVENOUS | Status: AC
  Administered 2019-09-11: 0.4 mg via INTRAVENOUS

## 2019-09-11 MED ORDER — TECHNETIUM TC 99M TETROFOSMIN IV KIT
10.6000 | PACK | Freq: Once | INTRAVENOUS | Status: AC | PRN
  Administered 2019-09-11: 10.6 via INTRAVENOUS

## 2019-09-11 MED ORDER — TECHNETIUM TC 99M TETROFOSMIN IV KIT
30.7000 | PACK | Freq: Once | INTRAVENOUS | Status: AC | PRN
  Administered 2019-09-11: 30.7 via INTRAVENOUS

## 2019-09-22 ENCOUNTER — Telehealth: Payer: Self-pay | Admitting: Cardiology

## 2019-09-22 NOTE — Telephone Encounter (Signed)
Wants nuc results

## 2019-09-23 ENCOUNTER — Telehealth: Payer: Self-pay

## 2019-09-23 NOTE — Telephone Encounter (Signed)
Patient states he does not hear well and wants results given to his wife whom is not available. Will try later.

## 2019-10-01 MED ORDER — ASPIRIN EC 81 MG PO TBEC: 81.0000 mg | DELAYED_RELEASE_TABLET | Freq: Every day | ORAL | 3 refills | Status: DC

## 2019-10-01 MED ORDER — NITROGLYCERIN 0.4 MG SL SUBL: 0.4000 mg | SUBLINGUAL_TABLET | SUBLINGUAL | 3 refills | Status: DC | PRN

## 2019-10-01 NOTE — Telephone Encounter (Signed)
Results relayed to spouse. Medications sent. Scheduled for f/u with Dr. 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

## 2019-10-01 NOTE — Addendum Note (Signed)
Addended by: Beckey Rutter on: 10/01/2019 10:16 AM   Modules accepted: Orders

## 2019-10-20 ENCOUNTER — Other Ambulatory Visit: Payer: Self-pay

## 2019-10-20 ENCOUNTER — Ambulatory Visit (INDEPENDENT_AMBULATORY_CARE_PROVIDER_SITE_OTHER): Payer: Medicare HMO | Admitting: Cardiology

## 2019-10-20 ENCOUNTER — Encounter: Payer: Self-pay | Admitting: Cardiology

## 2019-10-20 VITALS — BP 122/58 | HR 58 | Ht 68.0 in | Wt 195.8 lb

## 2019-10-20 DIAGNOSIS — R079 Chest pain, unspecified: Secondary | ICD-10-CM | POA: Diagnosis not present

## 2019-10-20 DIAGNOSIS — I2510 Atherosclerotic heart disease of native coronary artery without angina pectoris: Secondary | ICD-10-CM

## 2019-10-20 DIAGNOSIS — R9439 Abnormal result of other cardiovascular function study: Secondary | ICD-10-CM

## 2019-10-20 DIAGNOSIS — R9431 Abnormal electrocardiogram [ECG] [EKG]: Secondary | ICD-10-CM | POA: Diagnosis not present

## 2019-10-20 DIAGNOSIS — E7800 Pure hypercholesterolemia, unspecified: Secondary | ICD-10-CM

## 2019-10-20 DIAGNOSIS — Z87891 Personal history of nicotine dependence: Secondary | ICD-10-CM

## 2019-10-20 HISTORY — DX: Abnormal result of other cardiovascular function study: R94.39

## 2019-10-20 MED ORDER — METOPROLOL TARTRATE 50 MG PO TABS: 100.0000 mg | ORAL_TABLET | Freq: Once | ORAL | 0 refills | Status: DC

## 2019-10-20 NOTE — Patient Instructions (Addendum)
Medication Instructions:  Your physician recommends that you continue on your current medications as directed. Please refer to the Current Medication list given to you today.  If you need a refill on your cardiac medications before your next appointment, please call your pharmacy.   Lab work: You will need to come in 7 days prior to procedure to have a BMP drawn.  If you have labs (blood work) drawn today and your tests are completely normal, you will receive your results only by:  [place] (if you have MyChart) OR  A paper copy in the mail If you have any lab test that is abnormal or we need to change your treatment, we will call you to review the results.  Testing/Procedures: Non-Cardiac CT scanning, (CAT scanning), is a noninvasive, special x-ray that produces cross-sectional images of the body using x-rays and a computer. CT scans help physicians diagnose and treat medical conditions. For some CT exams, a contrast material is used to enhance visibility in the area of the body being studied. CT scans provide greater clarity and reveal more details than regular x-ray exams.  Please arrive at the [facility] main entrance of [facility] at xx:xx AM (30-45 minutes prior to test start time)  [facility] [place], [place] [address] [phone]  Proceed to the [facility] Radiology Department (First Floor).  Please follow these instructions carefully (unless otherwise directed):  Hold all erectile dysfunction medications at least 48 hours prior to test.  On the Night Before the Test:  Be sure to Drink plenty of water.  Do not consume any caffeinated/decaffeinated beverages or chocolate 12 hours prior to your test.  Do not take any antihistamines 12 hours prior to your test.   On the Day of the Test:  Drink plenty of water. Do not drink any water within one hour of the test.  Do not eat any food 4 hours prior to the test.  You may  take your regular medications prior to the test.   Take metoprolol (Lopressor) two hours prior to test.      -IF HR is greater than 55 BPM and patient is less than or equal to 71 yrs old Lopressor 100mg  x1. After the Test:  Drink plenty of water.  After receiving IV contrast, you may experience a mild flushed feeling. This is normal.  On occasion, you may experience a mild rash up to 24 hours after the test. This is not dangerous. If this occurs, you can take Benadryl 25 mg and increase your fluid intake.  If you experience trouble breathing, this can be serious. If it is severe call 911 IMMEDIATELY. If it is mild, please call our office.  If you take any of these medications: Glipizide/Metformin, Avandament, Glucavance, please do not take 48 hours after completing test.   Follow-Up: At [facility], you and your health needs are our priority.  As part of our continuing mission to provide you with exceptional heart care, we have created designated Provider Care Teams.  These Care Teams include your primary Cardiologist (physician) and Advanced Practice Providers (APPs -  Physician Assistants and Nurse Practitioners) who all work together to provide you with the care you need, when you need it. You will need a follow up appointment in 6 months.    Any Other Special Instructions Will Be Listed Below  Metoprolol tablets What is this medicine? METOPROLOL (me TOE proe lole) is a beta-blocker. Beta-blockers reduce the workload on  the heart and help it to beat more regularly. This medicine is used to treat high blood pressure and to prevent chest pain. It is also used to after a heart attack and to prevent an additional heart attack from occurring. This medicine may be used for other purposes; ask your health care provider or pharmacist if you have questions. COMMON BRAND NAME(S): Lopressor What should I tell my health care provider before I take this medicine? They need to know if you have any  of these conditions: -diabetes -heart or vessel disease like slow heart rate, worsening heart failure, heart block, sick sinus syndrome or Raynaud's disease -kidney disease -liver disease -lung or breathing disease, like asthma or emphysema -pheochromocytoma -thyroid disease -an unusual or allergic reaction to metoprolol, other beta-blockers, medicines, foods, dyes, or preservatives -pregnant or trying to get pregnant -breast-feeding How should I use this medicine? Take this medicine by mouth with a drink of water. Follow the directions on the prescription label. Take this medicine immediately after meals. Take your doses at regular intervals. Do not take more medicine than directed. Do not stop taking this medicine suddenly. This could lead to serious heart-related effects. Talk to your pediatrician regarding the use of this medicine in children. Special care may be needed. Overdosage: If you think you have taken too much of this medicine contact a poison control center or emergency room at once. NOTE: This medicine is only for you. Do not share this medicine with others. What if I miss a dose? If you miss a dose, take it as soon as you can. If it is almost time for your next dose, take only that dose. Do not take double or extra doses. What may interact with this medicine? This medicine may interact with the following medications: -certain medicines for blood pressure, heart disease, irregular heart beat -certain medicines for depression like monoamine oxidase (MAO) inhibitors, fluoxetine, or paroxetine -clonidine -dobutamine -epinephrine -isoproterenol -reserpine This list may not describe all possible interactions. Give your health care provider a list of all the medicines, herbs, non-prescription drugs, or dietary supplements you use. Also tell them if you smoke, drink alcohol, or use illegal drugs. Some items may interact with your medicine. What should I watch for while using this  medicine? Visit your doctor or health care professional for regular check ups. Contact your doctor right away if your symptoms worsen. Check your blood pressure and pulse rate regularly. Ask your health care professional what your blood pressure and pulse rate should be, and when you should contact them. You may get drowsy or dizzy. Do not drive, use machinery, or do anything that needs mental alertness until you know how this medicine affects you. Do not sit or stand up quickly, especially if you are an older patient. This reduces the risk of dizzy or fainting spells. Contact your doctor if these symptoms continue. Alcohol may interfere with the effect of this medicine. Avoid alcoholic drinks. What side effects may I notice from receiving this medicine? Side effects that you should report to your doctor or health care professional as soon as possible: -allergic reactions like skin rash, itching or hives -cold or numb hands or feet -depression -difficulty breathing -faint -fever with sore throat -irregular heartbeat, chest pain -rapid weight gain -swollen legs or ankles Side effects that usually do not require medical attention (report to your doctor or health care professional if they continue or are bothersome): -anxiety or nervousness -change in sex drive or performance -dry skin -headache -  nightmares or trouble sleeping -short term memory loss -stomach upset or diarrhea -unusually tired This list may not describe all possible side effects. Call your doctor for medical advice about side effects. You may report side effects to FDA at 1-800-FDA-1088. Where should I keep my medicine? Keep out of the reach of children. Store at room temperature between 15 and 30 degrees C (59 and 86 degrees F). Throw away any unused medicine after the expiration date. NOTE: This sheet is a summary. It may not cover all possible information. If you have questions about this medicine, talk to your doctor,  pharmacist, or health care provider.  2019 Elsevier/Gold Standard (2013-07-11 14:40:36)   Coronary Angiogram A coronary angiogram is an X-ray procedure that is used to examine the arteries in the heart. In this procedure, a dye (contrast dye) is injected through a long, thin tube (catheter). The catheter is inserted through the groin, wrist, or arm. The dye is injected into each artery, then X-rays are taken to show if there is a blockage in the arteries of the heart. This procedure can also show if you have valve disease or a disease of the aorta, and it can be used to check the overall function of your heart muscle. You may have a coronary angiogram if:  You are having chest pain, or other symptoms of angina, and you are at risk for heart disease.  You have an abnormal electrocardiogram (ECG) or stress test.  You have chest pain and heart failure.  You are having irregular heart rhythms.  You and your health care provider determine that the benefits of the test information outweigh the risks of the procedure. Let your health care provider know about:  Any allergies you have, including allergies to contrast dye.  All medicines you are taking, including vitamins, herbs, eye drops, creams, and over-the-counter medicines.  Any problems you or family members have had with anesthetic medicines.  Any blood disorders you have.  Any surgeries you have had.  History of kidney problems or kidney failure.  Any medical conditions you have.  Whether you are pregnant or may be pregnant. What are the risks? Generally, this is a safe procedure. However, problems may occur, including:  Infection.  Allergic reaction to medicines or dyes that are used.  Bleeding from the access site or other locations.  Kidney injury, especially in people with impaired kidney function.  Stroke (rare).  Heart attack (rare).  Damage to other structures or organs. What happens before the  procedure? Staying hydrated Follow instructions from your health care provider about hydration, which may include:  Up to 2 hours before the procedure - you may continue to drink clear liquids, such as water, clear fruit juice, black coffee, and plain tea. Eating and drinking restrictions Follow instructions from your health care provider about eating and drinking, which may include:  8 hours before the procedure - stop eating heavy meals or foods such as meat, fried foods, or fatty foods.  6 hours before the procedure - stop eating light meals or foods, such as toast or cereal.  2 hours before the procedure - stop drinking clear liquids. General instructions  Ask your health care provider about: ? Changing or stopping your regular medicines. This is especially important if you are taking diabetes medicines or blood thinners. ? Taking medicines such as ibuprofen. These medicines can thin your blood. Do not take these medicines before your procedure if your health care provider instructs you not to, though aspirin  may be recommended prior to coronary angiograms.  Plan to have someone take you home from the hospital or clinic.  You may need to have blood tests or X-rays done. What happens during the procedure?  An IV tube will be inserted into one of your veins.  You will be given one or more of the following: ? A medicine to help you relax (sedative). ? A medicine to numb the area where the catheter will be inserted into an artery (local anesthetic).  To reduce your risk of infection: ? Your health care team will wash or sanitize their hands. ? Your skin will be washed with soap. ? Hair may be removed from the area where the catheter will be inserted.  You will be connected to a continuous ECG monitor.  The catheter will be inserted into an artery. The location may be in your groin, in your wrist, or in the fold of your arm (near your elbow).  A type of X-ray (fluoroscopy) will  be used to help guide the catheter to the opening of the blood vessel that is being examined.  A dye will be injected into the catheter, and X-rays will be taken. The dye will help to show where any narrowing or blockages are located in the heart arteries.  Tell your health care provider if you have any chest pain or trouble breathing during the procedure.  If blockages are found, your health care provider may perform another procedure, such as inserting a coronary stent. The procedure may vary among health care providers and hospitals. What happens after the procedure?  After the procedure, you will need to keep the area still for a few hours, or for as long as told by your health care provider. If the procedure is done through the groin, you will be instructed to not bend and not cross your legs.  The insertion site will be checked frequently.  The pulse in your foot or wrist will be checked frequently.  You may have additional blood tests, X-rays, and a test that records the electrical activity of your heart (ECG).  Do not drive for 24 hours if you were given a sedative. Summary  A coronary angiogram is an X-ray procedure that is used to look into the arteries in the heart.  During the procedure, a dye (contrast dye) is injected through a long, thin tube (catheter). The catheter is inserted through the groin, wrist, or arm.  Tell your health care provider about any allergies you have, including allergies to contrast dye.  After the procedure, you will need to keep the area still for a few hours, or for as long as told by your health care provider. This information is not intended to replace advice given to you by your health care provider. Make sure you discuss any questions you have with your health care provider. Document Released: 05/13/2003 Document Revised: 08/18/2016 Document Reviewed: 08/18/2016 Elsevier Interactive Patient Education  2019 [employer].

## 2019-10-20 NOTE — Progress Notes (Signed)
Cardiology Office Note:    Date:  10/20/2019   ID:  Manuel Sanchez, DOB 05-30-1946, MRN [id number]  PCP:  Raina Mina., MD  Cardiologist:  Jenean Lindau, MD   Referring MD: Raina Mina., MD    ASSESSMENT:    1. Abnormal nuclear stress test   2. Abnormal electrocardiogram   3. Chest pain, unspecified type   4. Coronary artery disease involving native coronary artery of native heart without angina pectoris   5. Hypercholesterolemia   6. Ex-smoker    PLAN:    In order of problems listed above:  1. Chest discomfort: Coronary artery disease: Patient has known coronary artery disease by findings on CT scans of coronary calcifications in the past.  Unfortunately he leads a sedentary lifestyle.  Report of the stress test was discussed with the patient at extensive length.  Questions were answered to his and his wife satisfaction.  Total time for this evaluation was 30 minutes.  In view of these findings and risk factors I mentioned to him about coronary angiography conventional and CT scanning based.  He prefers the latter.  I respect his wishes.  Procedure, benefits and potential risks were explained extensively and he vocalized understanding.  Sublingual nitroglycerin prescription was sent, its protocol and 911 protocol explained and the patient vocalized understanding questions were answered to the patient's satisfaction 2. Essential hypertension: Blood pressure is stable 3. Mixed dyslipidemia: Lipids followed by primary care physician.  Further recommendations will be made based on the findings of the aforementioned test.  He knows to go to the nearest emergency room for any concerning symptoms.  Patient does use a coated aspirin on a daily basis.   Medication Adjustments/Labs and Tests Ordered: Current medicines are reviewed at length with the patient today.  Concerns regarding medicines are outlined above.  Orders Placed This Encounter  Procedures  . CT CORONARY MORPH W/CTA  COR W/SCORE W/CA W/CM &/OR WO/CM  . CT CORONARY FRACTIONAL FLOW RESERVE DATA PREP  . CT CORONARY FRACTIONAL FLOW RESERVE FLUID ANALYSIS  . Basic Metabolic Panel (BMET)  . EKG 12-Lead   Meds ordered this encounter  Medications  . metoprolol tartrate (LOPRESSOR) 50 MG tablet    Sig: Take 2 tablets (100 mg total) by mouth once for 1 dose. IF HR is 55 or greater, take 2 tablets 2 hours prior to procedure    Dispense:  2 tablet    Refill:  0     No chief complaint on file.    History of Present Illness:    Manuel Sanchez is a 73 y.o. male.  Patient has past medical history of of essential hypertension and dyslipidemia.  He has coronary calcifications on CT scans done in the past.  He was evaluated for chest discomfort.  Stress test is abnormal and therefore he is here for follow-up.  He leads a sedentary lifestyle.  He denies any chest pain orthopnea or PND.  He tells me that he does not get under any stress.  He mentions to me that he is not sexually active so trying to figure out with his symptoms are aggravated by physical exertion is very difficult.  At the time of my evaluation, the patient is alert awake oriented and in no distress.  Past Medical History:  Diagnosis Date  . Arthritis 07/23/2019   Rt acromioclavicular joint  . CAD (coronary artery disease) 07/23/2019  . COPD (chronic obstructive pulmonary disease) ([place]) 07/23/2019  . Cyst of left kidney 07/23/2019  .  Hypercholesterolemia 07/23/2019  . 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 Peripheral vascular disease, unspecified ([place]) 07/23/2019  . Skin cancer of nose 07/23/2019  . Vitamin B12 deficiency 07/23/2019    History reviewed. No pertinent surgical history.  Current Medications: Current Meds  Medication Sig  . albuterol (VENTOLIN HFA) 108 (90 BASE) MCG/ACT inhaler Inhale  2 puffs into the lungs every 4 (four) hours as needed for wheezing or shortness of breath.   Marland Kitchen aspirin EC 81 MG tablet Take 1 tablet (81 mg total) by mouth daily.  Marland Kitchen atorvastatin (LIPITOR) 20 MG tablet Take 20 mg by mouth daily.  . budesonide-formoterol (SYMBICORT) 160-4.5 MCG/ACT inhaler Take 2 puffs first thing in am and then another 2 puffs about 12 hours later.  . cholecalciferol (VITAMIN D) 1000 UNITS tablet Take 1,000 Units by mouth daily.  . Cyanocobalamin (B-12) 2500 MCG TABS Take 1 tablet by mouth every morning.  . montelukast (SINGULAIR) 10 MG tablet Take 10 mg by mouth daily.  . nitroGLYCERIN (NITROSTAT) 0.4 MG SL tablet Place 1 tablet (0.4 mg total) under the tongue every 5 (five) minutes as needed. Take as needed for chest pain     Allergies:   Codeine and Levaquin [levofloxacin in d5w]   Social History   Socioeconomic History  . Marital status: Married    Spouse name: Not on file  . Number of children: 4  . Years of education: Not on file  . Highest education level: Not on file  Occupational History  . Occupation: Retired  Scientific laboratory technician  . Financial resource strain: Not on file  . Food insecurity    Worry: Not on file    Inability: Not on file  . Transportation needs    Medical: Not on file    Non-medical: Not on file  Tobacco Use  . Smoking status: Former Smoker    Packs/day: 0.75    Years: 45.00    Pack years: 33.75    Types: Cigarettes    Quit date: 11/20/2002    Years since quitting: 16.9  . Smokeless tobacco: Never Used  Substance and Sexual Activity  . Alcohol use: No  . Drug use: No  . Sexual activity: Not on file  Lifestyle  . Physical activity    Days per week: Not on file    Minutes per session: Not on file  . Stress: Not on file  Relationships  . Social Herbalist on phone: Not on file    Gets together: Not on file    Attends religious service: Not on file    Active member of club or organization: Not on file    Attends meetings of  clubs or organizations: Not on file    Relationship status: Not on file  Other Topics Concern  . Not on file  Social History Narrative  . Not on file     Family History: The patient's family history includes Colon cancer in his father; Heart disease in his mother; Stroke in his mother.  ROS:   Please see the history of present illness.    All other systems reviewed and are negative.  EKGs/Labs/Other Studies Reviewed:    The following studies were reviewed today:  EKG reveals sinus rhythm and nonspecific ST-T changes.   Study Highlights  The left ventricular ejection fraction is normal (55-65%).  Nuclear stress EF: 58%.  There was no ST segment deviation noted during stress.  No T wave inversion was noted during stress.  Defect 1: There is a small defect of mild severity present in the mid inferior location. This defect is reversible with normal wall.  Findings consistent with ischemia.  This is a low risk study.      Recent Labs: No results found for requested labs within last 8760 hours.  Recent Lipid Panel No results found for: CHOL, TRIG, HDL, CHOLHDL, VLDL, LDLCALC, LDLDIRECT  Physical Exam:    VS:  BP (!) 122/58 (BP Location: Left Arm, Patient Position: Sitting, Cuff Size: Normal)   Pulse (!) 58   Ht 5\' 8"  (1.727 m)   Wt 195 lb 12.8 oz (88.8 kg)   SpO2 96%   BMI 29.77 kg/m     Wt Readings from Last 3 Encounters:  10/20/19 195 lb 12.8 oz (88.8 kg)  09/11/19 189 lb (85.7 kg)  07/23/19 189 lb (85.7 kg)     GEN: Patient is in no acute distress HEENT: Normal NECK: No JVD; No carotid bruits LYMPHATICS: No lymphadenopathy CARDIAC: Hear sounds regular, 2/6 systolic murmur at the apex. RESPIRATORY:  Clear to auscultation without rales, wheezing or rhonchi  ABDOMEN: Soft, non-tender, non-distended MUSCULOSKELETAL:  No edema; No deformity  SKIN: Warm and dry NEUROLOGIC:  Alert and oriented x 3 PSYCHIATRIC:  Normal affect   Signed, Jenean Lindau,  MD  10/20/2019 8:59 AM    [facility]

## 2019-12-01 LAB — BASIC METABOLIC PANEL
BUN/Creatinine Ratio: 13 (ref 10–24)
BUN: 14 mg/dL (ref 8–27)
CO2: 25 mmol/L (ref 20–29)
Calcium: 9.7 mg/dL (ref 8.6–10.2)
Chloride: 100 mmol/L (ref 96–106)
Creatinine, Ser: 1.08 mg/dL (ref 0.76–1.27)
GFR calc Af Amer: 79 mL/min/{1.73_m2} (ref >59)
GFR calc non Af Amer: 68 mL/min/{1.73_m2} (ref >59)
Glucose: 117 mg/dL — ABNORMAL HIGH (ref 65–99)
Potassium: 3.9 mmol/L (ref 3.5–5.2)
Sodium: 139 mmol/L (ref 134–144)

## 2019-12-03 ENCOUNTER — Encounter: Payer: Self-pay | Admitting: *Deleted

## 2019-12-05 ENCOUNTER — Telehealth (HOSPITAL_COMMUNITY): Payer: Self-pay | Admitting: Emergency Medicine

## 2019-12-05 NOTE — Telephone Encounter (Signed)
unable to leave VM.

## 2019-12-08 ENCOUNTER — Other Ambulatory Visit: Payer: Self-pay

## 2019-12-08 ENCOUNTER — Ambulatory Visit (HOSPITAL_COMMUNITY)
Admission: RE | Admit: 2019-12-08 | Discharge: 2019-12-08 | Disposition: A | Discharge status: Home / Self Care | Payer: Medicare HMO | Source: Ambulatory Visit | Attending: Cardiology | Admitting: Cardiology

## 2019-12-08 DIAGNOSIS — I700 Atherosclerosis of aorta: Secondary | ICD-10-CM | POA: Insufficient documentation

## 2019-12-08 DIAGNOSIS — I2510 Atherosclerotic heart disease of native coronary artery without angina pectoris: Secondary | ICD-10-CM | POA: Insufficient documentation

## 2019-12-08 DIAGNOSIS — R931 Abnormal findings on diagnostic imaging of heart and coronary circulation: Secondary | ICD-10-CM | POA: Insufficient documentation

## 2019-12-08 DIAGNOSIS — R079 Chest pain, unspecified: Secondary | ICD-10-CM | POA: Diagnosis not present

## 2019-12-08 DIAGNOSIS — R9431 Abnormal electrocardiogram [ECG] [EKG]: Secondary | ICD-10-CM | POA: Diagnosis present

## 2019-12-08 IMAGING — CT CT HEART MORP W/ CTA COR W/ SCORE W/ CA W/CM &/OR W/O CM
4 of 7 series · 8 of 20 positions shown, 9 images · IV contrast (APPLIED)
Comparison: CTA chest for PE dated 08/18/2019.
COMPARISON: CTA chest for PE dated 08/18/2019.

Addendum:
EXAM:
OVER-READ INTERPRETATION CT CHEST

The following report is an over-read performed by radiologist Dr.
over-read does not include interpretation of cardiac or coronary
anatomy or pathology. The coronary calcium score/coronary CTA
interpretation by the cardiologist is attached.
CLINICAL DATA: Chest pain
Cardiac CTA
MEDICATIONS:
Sub lingual nitro. 4mg x 2
TECHNIQUE: The patient was scanned on a Siemens [REDACTED]ice scanner. Gantry
rotation speed was 250 msecs. Collimation was 0.6 mm. A 100 kV
prospective scan was triggered in the ascending thoracic aorta at
35-75% of the R-R interval. Average HR during the scan was 60 bpm.
The 3D data set was interpreted on a dedicated work station using
MPR, MIP and VRT modes. A total of 80cc of contrast was used.

[Series 8: best diast 73 % · axial · 0.46mm/px · z∈[-273,-223]mm · 2 of 378 slices shown, 3 images]
[im 126/378  vessel]
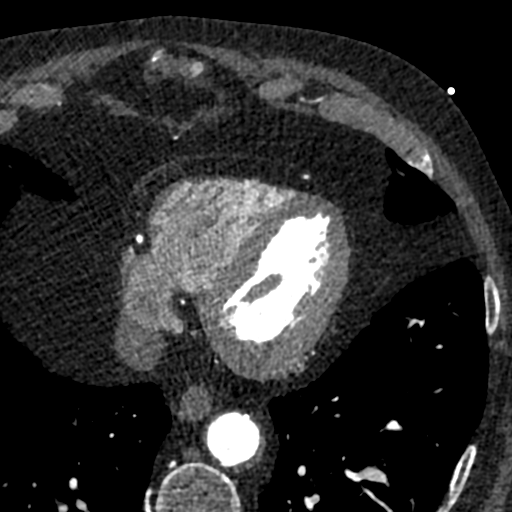
[im 126/378  lung]
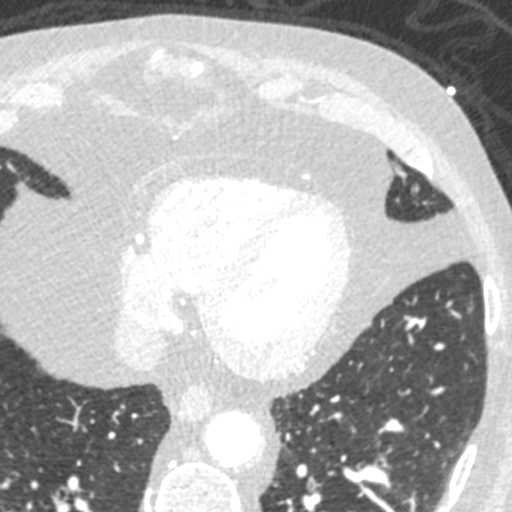
[im 252/378  vessel]
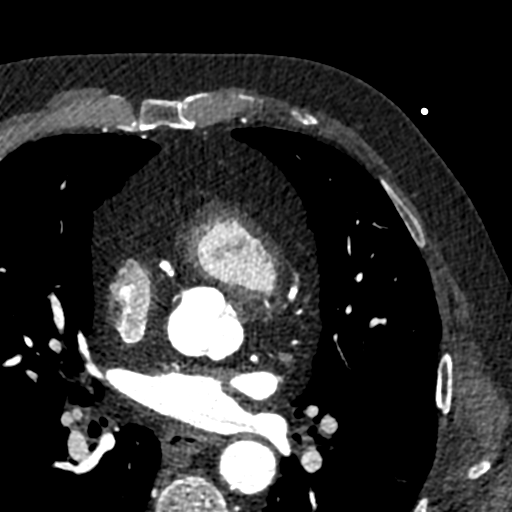

[Series 9: best syst 36 % · axial · 0.46mm/px · z∈[-273,-223]mm · 2 of 378 slices shown]
[im 126/378  vessel]
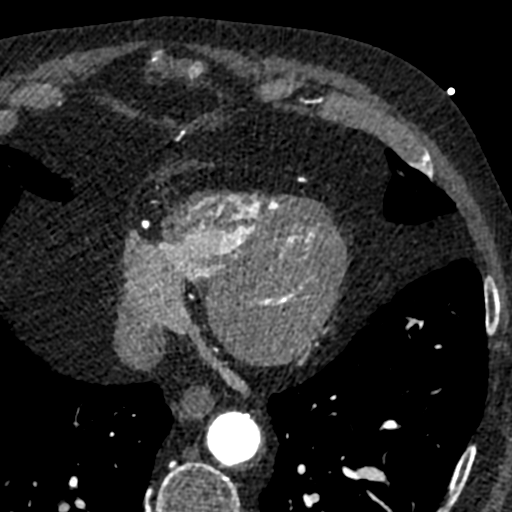
[im 252/378  vessel]
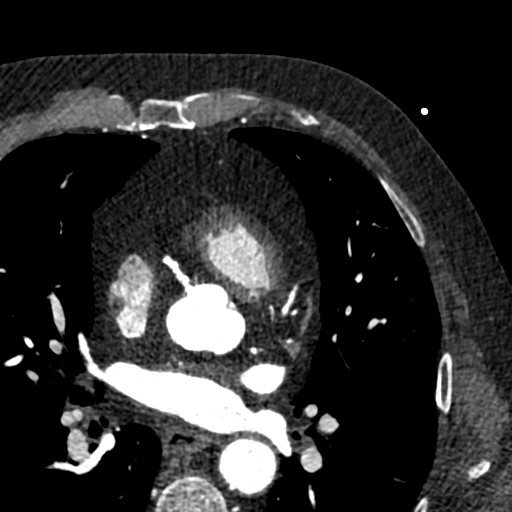

[Series 10: ts diast sharp 36 % · axial · 0.46mm/px · z∈[-273,-223]mm · 2 of 378 slices shown]
[im 126/378  lung]
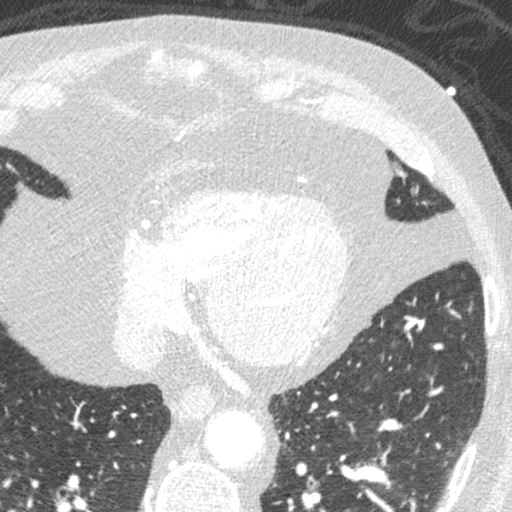
[im 252/378  lung]
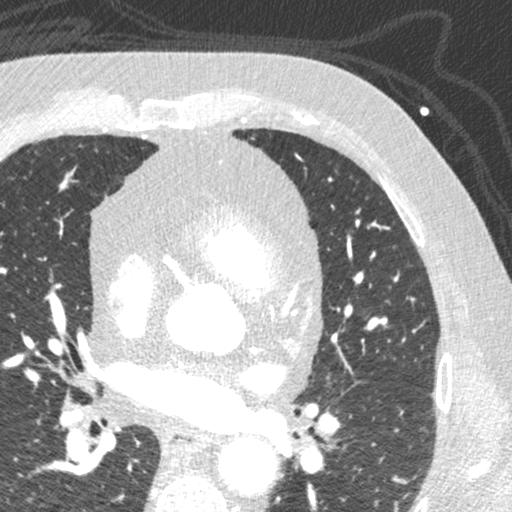

[Series 11: ts syst sharp 36 % · axial · 0.46mm/px · z∈[-273,-223]mm · 2 of 378 slices shown]
[im 126/378  lung]
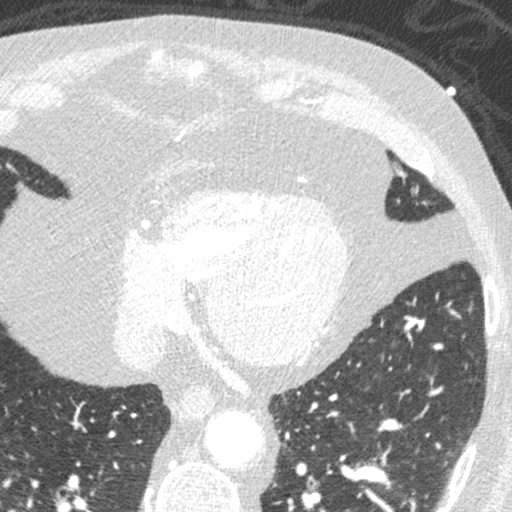
[im 252/378  lung]
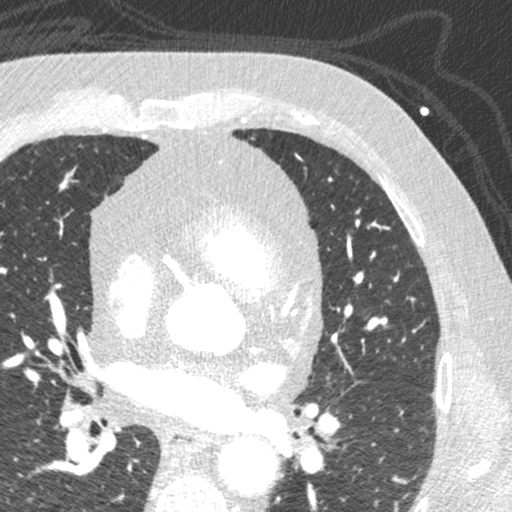

[8 of 20 positions shown; findings below may reference images not displayed]

FINDINGS: Vascular: Moderate atherosclerosis involving the descending thoracic
aorta with calcified and noncalcified plaque. No evidence of
aneurysm involving the visualized aorta.

Mediastinum/Nodes: No pathologic lymphadenopathy within the
visualized mediastinum. Visualized esophagus normal in appearance.

Lungs/Pleura: Visualized lung parenchyma clear. Central bronchi
patent without significant bronchial wall thickening. No pleural
effusions.

Upper Abdomen: Unremarkable for the early arterial phase of
enhancement.

Musculoskeletal: Healed BILATERAL POSTERIOR rib fractures. No acute
findings.
IMPRESSION: 1. No acute cardiopulmonary disease involving the visualized thorax.
2. Moderate atherosclerosis involving the descending thoracic aorta
with calcified and noncalcified plaque. Aortic Atherosclerosis
(QDE3D-170.0)
FINDINGS: Non-cardiac: See separate report from [REDACTED].

Pulmonary veins drain normally to the left atrium. No LA appendage
thrombus seen.

Calcium Score: 724 Agatston units.

Coronary Arteries: Right dominant with no anomalies

LM: Short vessel, mixed plaque throughout the vessel but mild (<50%)
stenosis.

LAD system: Mixed plaque in the proximal and mid LAD with mild
(<50%) stenosis.

Circumflex system: Calcified plaque proximal LCx with mild (<50%)
stenosis.

RCA system: Calcified plaque in the proximal and mid RCA. Mild
(<50%) stenosis.
IMPRESSION: 1. Coronary artery calcium score 724 Agatston units. This places the
patient in the 77th percentile for age and gender, suggesting
intermediate to high risk for future cardiac events.

2. Extensive coronary disease but appears nonobstructive. Will send
for FFR to confirm.

Giorgi Jumper

*** End of Addendum ***
EXAM:
OVER-READ INTERPRETATION CT CHEST

The following report is an over-read performed by radiologist Dr.
over-read does not include interpretation of cardiac or coronary
anatomy or pathology. The coronary calcium score/coronary CTA
interpretation by the cardiologist is attached.
FINDINGS: Vascular: Moderate atherosclerosis involving the descending thoracic
aorta with calcified and noncalcified plaque. No evidence of
aneurysm involving the visualized aorta.

Mediastinum/Nodes: No pathologic lymphadenopathy within the
visualized mediastinum. Visualized esophagus normal in appearance.

Lungs/Pleura: Visualized lung parenchyma clear. Central bronchi
patent without significant bronchial wall thickening. No pleural
effusions.

Upper Abdomen: Unremarkable for the early arterial phase of
enhancement.

Musculoskeletal: Healed BILATERAL POSTERIOR rib fractures. No acute
findings.
IMPRESSION: 1. No acute cardiopulmonary disease involving the visualized thorax.
2. Moderate atherosclerosis involving the descending thoracic aorta
with calcified and noncalcified plaque. Aortic Atherosclerosis
(QDE3D-170.0)

## 2019-12-08 MED ORDER — NITROGLYCERIN 0.4 MG SL SUBL
SUBLINGUAL_TABLET | SUBLINGUAL | Status: AC
  Administered 2019-12-08: 0.8 mg
  Filled 2019-12-08: qty 2

## 2019-12-08 MED ORDER — METOPROLOL TARTRATE 5 MG/5ML IV SOLN
INTRAVENOUS | Status: AC
  Filled 2019-12-08: qty 5

## 2019-12-08 MED ORDER — NITROGLYCERIN 0.4 MG SL SUBL: 0.8000 mg | SUBLINGUAL_TABLET | SUBLINGUAL | Status: DC | PRN

## 2019-12-08 MED ORDER — METOPROLOL TARTRATE 5 MG/5ML IV SOLN
5.0000 mg | INTRAVENOUS | Status: DC | PRN
  Administered 2019-12-08: 5 mg via INTRAVENOUS

## 2019-12-08 MED ORDER — IOHEXOL 350 MG/ML SOLN
80.0000 mL | Freq: Once | INTRAVENOUS | Status: AC | PRN
  Administered 2019-12-08: 80 mL via INTRAVENOUS

## 2019-12-10 ENCOUNTER — Telehealth: Payer: Self-pay

## 2019-12-10 DIAGNOSIS — R9431 Abnormal electrocardiogram [ECG] [EKG]: Secondary | ICD-10-CM | POA: Diagnosis not present

## 2019-12-10 DIAGNOSIS — R079 Chest pain, unspecified: Secondary | ICD-10-CM | POA: Diagnosis not present

## 2019-12-10 NOTE — Telephone Encounter (Signed)
-----   Message from Jenean Lindau, MD sent at 12/10/2019  2:03 PM EST ----- Elective appointment to discuss this report.  Make sure pt he is taking aspirin and has nitroglycerin handy. Jenean Lindau, MD 12/10/2019 2:03 PM

## 2019-12-10 NOTE — Telephone Encounter (Signed)
Results relayed, patient takes aspirin daily and has nitro on hand. Scheduled for f/u with [facility] on 12/17/19 in [place]

## 2019-12-15 ENCOUNTER — Telehealth: Payer: Self-pay | Admitting: Cardiology

## 2019-12-15 NOTE — Telephone Encounter (Signed)
I placed call to number listed as call back number for [facility]. This number is no longer in service. I placed call to number listed for patient and received message that caller is not available. No option to leave a message

## 2019-12-15 NOTE — Telephone Encounter (Signed)
New Message   Patients wife Manuel Sanchez would need to be with patient at his appointment due to his memory.

## 2019-12-17 ENCOUNTER — Other Ambulatory Visit: Payer: Self-pay

## 2019-12-17 ENCOUNTER — Ambulatory Visit (INDEPENDENT_AMBULATORY_CARE_PROVIDER_SITE_OTHER): Payer: Medicare HMO | Admitting: Cardiology

## 2019-12-17 ENCOUNTER — Encounter: Payer: Self-pay | Admitting: Cardiology

## 2019-12-17 VITALS — BP 130/72 | HR 75 | Ht 68.0 in | Wt 197.0 lb

## 2019-12-17 DIAGNOSIS — R9439 Abnormal result of other cardiovascular function study: Secondary | ICD-10-CM

## 2019-12-17 DIAGNOSIS — Z1329 Encounter for screening for other suspected endocrine disorder: Secondary | ICD-10-CM

## 2019-12-17 DIAGNOSIS — I2510 Atherosclerotic heart disease of native coronary artery without angina pectoris: Secondary | ICD-10-CM | POA: Diagnosis not present

## 2019-12-17 DIAGNOSIS — I739 Peripheral vascular disease, unspecified: Secondary | ICD-10-CM | POA: Diagnosis not present

## 2019-12-17 NOTE — Progress Notes (Signed)
Cardiology Office Note:    Date:  12/17/2019   ID:  Manuel Sanchez, DOB 04-05-1946, MRN [id number]  PCP:  Raina Mina., MD  Cardiologist:  Jenean Lindau, MD   Referring MD: Raina Mina., MD    ASSESSMENT:    No diagnosis found. PLAN:    In order of problems listed above:  1. Coronary artery disease: Secondary prevention stressed with the patient.  Importance of compliance with diet and medication stressed and he vocalized understanding.  He walks 30 minutes a day on a regular basis without any symptoms now.  I congratulated him and encouraged him to continue it. 2. Essential hypertension: Blood pressure is stable 3. Mixed dyslipidemia: Patient on statin therapy and will be back in 6 weeks for liver lipid check. 4. Patient will be seen in follow-up appointment in 2 months or earlier if the patient has any concerns   Medication Adjustments/Labs and Tests Ordered: Current medicines are reviewed at length with the patient today.  Concerns regarding medicines are outlined above.  No orders of the defined types were placed in this encounter.  No orders of the defined types were placed in this encounter.    Chief Complaint  Patient presents with  . Follow-up     History of Present Illness:    Manuel Sanchez is a 74 y.o. male.  Patient has past medical history of mixed dyslipidemia.  He was found to have coronary artery disease by CT coronary angiography.  The details was mentioned below.  He denies any problems at this time and takes care of activities of daily living.  He leads a sedentary lifestyle.  At the time of my evaluation, the patient is alert awake oriented and in no distress.  Past Medical History:  Diagnosis Date  . Arthritis 07/23/2019   Rt acromioclavicular joint  . CAD (coronary artery disease) 07/23/2019  . COPD (chronic obstructive pulmonary disease) ([place]) 07/23/2019  . Cyst of left kidney 07/23/2019  . Hypercholesterolemia 07/23/2019  . Intrinsic asthma  06/29/2013   Followed in Pulmonary clinic/ [facility]/ [facility] - HFA 75%  08/21/2013  - PFTs 08/21/2013 FEV1  2.52 (82%) ratio 78 and no no change p B2, DLCO 65 corrects to 83%   . Peripheral vascular disease, unspecified ([place]) 07/23/2019  . Skin cancer of nose 07/23/2019  . Vitamin B12 deficiency 07/23/2019    History reviewed. No pertinent surgical history.  Current Medications: Current Meds  Medication Sig  . albuterol (VENTOLIN HFA) 108 (90 BASE) MCG/ACT inhaler Inhale 2 puffs into the lungs every 4 (four) hours as needed for wheezing or shortness of breath.   Marland Kitchen aspirin EC 81 MG tablet Take 1 tablet (81 mg total) by mouth daily.  Marland Kitchen atorvastatin (LIPITOR) 20 MG tablet Take 20 mg by mouth daily.  . budesonide-formoterol (SYMBICORT) 160-4.5 MCG/ACT inhaler Take 2 puffs first thing in am and then another 2 puffs about 12 hours later.  . cholecalciferol (VITAMIN D) 1000 UNITS tablet Take 1,000 Units by mouth daily.  . Cyanocobalamin (B-12) 2500 MCG TABS Take 1 tablet by mouth every morning.  . ferrous sulfate 325 (65 FE) MG EC tablet Take by mouth.  . montelukast (SINGULAIR) 10 MG tablet Take 10 mg by mouth daily.  . nitroGLYCERIN (NITROSTAT) 0.4 MG SL tablet Place 1 tablet (0.4 mg total) under the tongue every 5 (five) minutes as needed. Take as needed for chest pain     Allergies:   Codeine and Levaquin [levofloxacin in d5w]  Social History   Socioeconomic History  . Marital status: Married    Spouse name: Not on file  . Number of children: 4  . Years of education: Not on file  . Highest education level: Not on file  Occupational History  . Occupation: Retired  Tobacco Use  . Smoking status: Former Smoker    Packs/day: 0.75    Years: 45.00    Pack years: 33.75    Types: Cigarettes    Quit date: 11/20/2002    Years since quitting: 17.0  . Smokeless tobacco: Never Used  Substance and Sexual Activity  . Alcohol use: No  . Drug use: No  . Sexual activity: Not on file  Other  Topics Concern  . Not on file  Social History Narrative  . Not on file   Social Determinants of Health   Financial Resource Strain:   . Difficulty of Paying Living Expenses: Not on file  Food Insecurity:   . Worried About Charity fundraiser in the Last Year: Not on file  . Ran Out of Food in the Last Year: Not on file  Transportation Needs:   . Lack of Transportation (Medical): Not on file  . Lack of Transportation (Non-Medical): Not on file  Physical Activity:   . Days of Exercise per Week: Not on file  . Minutes of Exercise per Session: Not on file  Stress:   . Feeling of Stress : Not on file  Social Connections:   . Frequency of Communication with Friends and Family: Not on file  . Frequency of Social Gatherings with Friends and Family: Not on file  . Attends Religious Services: Not on file  . Active Member of Clubs or Organizations: Not on file  . Attends Archivist Meetings: Not on file  . Marital Status: Not on file     Family History: The patient's family history includes Colon cancer in his father; Heart disease in his mother; Stroke in his mother.  ROS:   Please see the history of present illness.    All other systems reviewed and are negative.  EKGs/Labs/Other Studies Reviewed:    The following studies were reviewed today: Study Highlights   The left ventricular ejection fraction is normal (55-65%).  Nuclear stress EF: 58%.  There was no ST segment deviation noted during stress.  No T wave inversion was noted during stress.  Defect 1: There is a small defect of mild severity present in the mid inferior location. This defect is reversible with normal wall.  Findings consistent with ischemia.  This is a low risk study.   CLINICAL DATA:  Chest pain  EXAM: CT FFR  MEDICATIONS: No additional medications  TECHNIQUE: The coronary CTA was sent for FFR  FINDINGS: FFR 0.84 mid RCA  FFR 0.86 mid LAD, 0.79 distal LAD  FFR 0.88 mid  LCx  IMPRESSION: Borderline distal LAD FFR. Would recommend medical management unless significantly symptomatic.  Dalton Mclean   Electronically Signed   By: Loralie Champagne M.D.   On: 12/10/2019 11:47  CLINICAL DATA:  Chest pain  EXAM: Cardiac CTA  MEDICATIONS: Sub lingual nitro. 4mg  x 2  TECHNIQUE: The patient was scanned on a Siemens [id number] slice scanner. Gantry rotation speed was 250 msecs. Collimation was 0.6 mm. A 100 kV prospective scan was triggered in the ascending thoracic aorta at 35-75% of the R-R interval. Average HR during the scan was 60 bpm. The 3D data set was interpreted on a dedicated work station  using MPR, MIP and VRT modes. A total of 80cc of contrast was used.  FINDINGS: Non-cardiac: See separate report from [facility].  Pulmonary veins drain normally to the left atrium. No LA appendage thrombus seen.  Calcium Score: 724 Agatston units.  Coronary Arteries: Right dominant with no anomalies  LM: Short vessel, mixed plaque throughout the vessel but mild (<50%) stenosis.  LAD system: Mixed plaque in the proximal and mid LAD with mild (<50%) stenosis.  Circumflex system: Calcified plaque proximal LCx with mild (<50%) stenosis.  RCA system: Calcified plaque in the proximal and mid RCA. Mild (<50%) stenosis.  IMPRESSION: 1. Coronary artery calcium score 724 Agatston units. This places the patient in the 77th percentile for age and gender, suggesting intermediate to high risk for future cardiac events.  2. Extensive coronary disease but appears nonobstructive. Will send for FFR to confirm.  Dalton Mclean   Electronically Signed   By: Loralie Champagne M.D.   On: 12/08/2019 16:05    Recent Labs: 12/01/2019: BUN 14; Creatinine, Ser 1.08; Potassium 3.9; Sodium 139  Recent Lipid Panel No results found for: CHOL, TRIG, HDL, CHOLHDL, VLDL, LDLCALC, LDLDIRECT  Physical Exam:    VS:  BP 130/72   Pulse 75   Ht  5\' 8"  (1.727 m)   Wt 197 lb (89.4 kg)   SpO2 90%   BMI 29.95 kg/m     Wt Readings from Last 3 Encounters:  12/17/19 197 lb (89.4 kg)  10/20/19 195 lb 12.8 oz (88.8 kg)  09/11/19 189 lb (85.7 kg)     GEN: Patient is in no acute distress HEENT: Normal NECK: No JVD; No carotid bruits LYMPHATICS: No lymphadenopathy CARDIAC: Hear sounds regular, 2/6 systolic murmur at the apex. RESPIRATORY:  Clear to auscultation without rales, wheezing or rhonchi  ABDOMEN: Soft, non-tender, non-distended MUSCULOSKELETAL:  No edema; No deformity  SKIN: Warm and dry NEUROLOGIC:  Alert and oriented x 3 PSYCHIATRIC:  Normal affect   Signed, Jenean Lindau, MD  12/17/2019 3:27 PM    [facility]

## 2019-12-17 NOTE — Patient Instructions (Signed)
Medication Instructions:  Your physician recommends that you continue on your current medications as directed. Please refer to the Current Medication list given to you today.  *If you need a refill on your cardiac medications before your next appointment, please call your pharmacy*  Lab Work: Your physician recommends that you return FASTING in 6 wks for BMP, CBC, TSH, hepatic and lipid drawn  If you have labs (blood work) drawn today and your tests are completely normal, you will receive your results only by: Marland Kitchen MyChart Message (if you have MyChart) OR . A paper copy in the mail If you have any lab test that is abnormal or we need to change your treatment, we will call you to review the results.  Testing/Procedures: You had an EKG performed today  Follow-Up: At [facility], you and your health needs are our priority.  As part of our continuing mission to provide you with exceptional heart care, we have created designated Provider Care Teams.  These Care Teams include your primary Cardiologist (physician) and Advanced Practice Providers (APPs -  Physician Assistants and Nurse Practitioners) who all work together to provide you with the care you need, when you need it.  Your next appointment:   2 month(s)  The format for your next appointment:   In Person  Provider:   Jyl Heinz, MD

## 2020-01-19 ENCOUNTER — Encounter: Payer: Self-pay | Admitting: Cardiology

## 2020-01-19 ENCOUNTER — Other Ambulatory Visit: Payer: Self-pay

## 2020-01-19 ENCOUNTER — Ambulatory Visit (INDEPENDENT_AMBULATORY_CARE_PROVIDER_SITE_OTHER): Payer: Medicare HMO | Admitting: Cardiology

## 2020-01-19 VITALS — BP 124/62 | HR 73 | Ht 68.0 in | Wt 197.0 lb

## 2020-01-19 DIAGNOSIS — Z1329 Encounter for screening for other suspected endocrine disorder: Secondary | ICD-10-CM | POA: Diagnosis not present

## 2020-01-19 DIAGNOSIS — Z87891 Personal history of nicotine dependence: Secondary | ICD-10-CM

## 2020-01-19 DIAGNOSIS — E7800 Pure hypercholesterolemia, unspecified: Secondary | ICD-10-CM

## 2020-01-19 DIAGNOSIS — I2510 Atherosclerotic heart disease of native coronary artery without angina pectoris: Secondary | ICD-10-CM

## 2020-01-19 NOTE — Addendum Note (Signed)
Addended by: Truddie Hidden on: 01/19/2020 10:42 AM   Modules accepted: Orders

## 2020-01-19 NOTE — Progress Notes (Signed)
Cardiology Office Note:    Date:  01/19/2020   ID:  Manuel Sanchez, DOB Feb 24, 1946, MRN [id number]  PCP:  Raina Mina., MD  Cardiologist:  Jenean Lindau, MD   Referring MD: Raina Mina., MD    ASSESSMENT:    1. Coronary artery disease involving native coronary artery of native heart without angina pectoris   2. Hypercholesterolemia   3. Ex-smoker    PLAN:    In order of problems listed above:  1. Coronary artery disease: Secondary prevention stressed with the patient.  Importance of compliance with diet and medication stressed and he vocalized understanding.  He is exercising well without any symptoms. 2. Essential hypertension: Blood pressure is stable 3. 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  He will be back in the next few days for complete blood work including fasting lipids 5. Patient will be seen in follow-up appointment in 6 months or earlier if the patient has any concerns 6. Patient has abdominal obesity and weight reduction was stressed and he promises to do better.   Medication Adjustments/Labs and Tests Ordered: Current medicines are reviewed at length with the patient today.  Concerns regarding medicines are outlined above.  No orders of the defined types were placed in this encounter.  No orders of the defined types were placed in this encounter.    Chief Complaint  Patient presents with  . Follow-up     History of Present Illness:    Manuel Sanchez is a 74 y.o. male.  Patient has past medical history of coronary artery disease recently diagnosed by CT scan.  Distal LAD has borderline lesion please see complete report mentioned below.  He has history of dyslipidemia and is an ex-smoker.  He denies any problems at this time and takes care of activities of daily living.  He started walking on a regular basis.  At the  time of my evaluation, the patient is alert awake oriented and in no distress.  Past Medical History:  Diagnosis Date  . Arthritis 07/23/2019   Rt acromioclavicular joint  . CAD (coronary artery disease) 07/23/2019  . COPD (chronic obstructive pulmonary disease) ([place]) 07/23/2019  . Cyst of left kidney 07/23/2019  . Hypercholesterolemia 07/23/2019  . 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/ [place] - HFA 75%  08/21/2013  - PFTs 08/21/2013 FEV1  2.52 (82%) ratio 78 and no no change p B2, DLCO 65 corrects to 83%   . Peripheral vascular disease, unspecified ([place]) 07/23/2019  . Skin cancer of nose 07/23/2019  . Vitamin B12 deficiency 07/23/2019    History reviewed. No pertinent surgical history.  Current Medications: Current Meds  Medication Sig  . albuterol (VENTOLIN HFA) 108 (90 BASE) MCG/ACT inhaler Inhale 2 puffs into the lungs every 4 (four) hours as needed for wheezing or shortness of breath.   Marland Kitchen aspirin EC 81 MG tablet Take 1 tablet (81 mg total) by mouth daily.  Marland Kitchen atorvastatin (LIPITOR) 20 MG tablet Take 20 mg by mouth daily.  . budesonide-formoterol (SYMBICORT) 160-4.5 MCG/ACT inhaler Take 2 puffs first thing in am and then another 2 puffs about 12 hours later.  . cholecalciferol (VITAMIN D) 1000 UNITS tablet Take 1,000 Units by mouth daily.  . Cyanocobalamin (B-12) 2500 MCG TABS Take 1 tablet by mouth every morning.  . ferrous sulfate 325 (65 FE) MG EC tablet Take  by mouth.  . metoprolol tartrate (LOPRESSOR) 50 MG tablet Take 2 tablets (100 mg total) by mouth once for 1 dose. IF HR is 55 or greater, take 2 tablets 2 hours prior to procedure  . montelukast (SINGULAIR) 10 MG tablet Take 10 mg by mouth daily.  . nitroGLYCERIN (NITROSTAT) 0.4 MG SL tablet Place 1 tablet (0.4 mg total) under the tongue every 5 (five) minutes as needed. Take as needed for chest pain     Allergies:   Codeine and Levaquin [levofloxacin in d5w]   Social History   Socioeconomic  History  . Marital status: Married    Spouse name: Not on file  . Number of children: 4  . Years of education: Not on file  . Highest education level: Not on file  Occupational History  . Occupation: Retired  Tobacco Use  . Smoking status: Former Smoker    Packs/day: 0.75    Years: 45.00    Pack years: 33.75    Types: Cigarettes    Quit date: 11/20/2002    Years since quitting: 17.1  . Smokeless tobacco: Never Used  Substance and Sexual Activity  . Alcohol use: No  . Drug use: No  . Sexual activity: Not on file  Other Topics Concern  . Not on file  Social History Narrative  . Not on file   Social Determinants of Health   Financial Resource Strain:   . Difficulty of Paying Living Expenses: Not on file  Food Insecurity:   . Worried About Charity fundraiser in the Last Year: Not on file  . Ran Out of Food in the Last Year: Not on file  Transportation Needs:   . Lack of Transportation (Medical): Not on file  . Lack of Transportation (Non-Medical): Not on file  Physical Activity:   . Days of Exercise per Week: Not on file  . Minutes of Exercise per Session: Not on file  Stress:   . Feeling of Stress : Not on file  Social Connections:   . Frequency of Communication with Friends and Family: Not on file  . Frequency of Social Gatherings with Friends and Family: Not on file  . Attends Religious Services: Not on file  . Active Member of Clubs or Organizations: Not on file  . Attends Archivist Meetings: Not on file  . Marital Status: Not on file     Family History: The patient's family history includes Colon cancer in his father; Heart disease in his mother; Stroke in his mother.  ROS:   Please see the history of present illness.    All other systems reviewed and are negative.  EKGs/Labs/Other Studies Reviewed:    The following studies were reviewed today: CLINICAL DATA:  Chest pain  EXAM: CT FFR  MEDICATIONS: No additional  medications  TECHNIQUE: The coronary CTA was sent for FFR  FINDINGS: FFR 0.84 mid RCA  FFR 0.86 mid LAD, 0.79 distal LAD  FFR 0.88 mid LCx  IMPRESSION: Borderline distal LAD FFR. Would recommend medical management unless significantly symptomatic.  Manuel Sanchez   Electronically Signed   By: Loralie Champagne M.D.   On: 12/10/2019 11:47   Recent Labs: 12/01/2019: BUN 14; Creatinine, Ser 1.08; Potassium 3.9; Sodium 139  Recent Lipid Panel No results found for: CHOL, TRIG, HDL, CHOLHDL, VLDL, LDLCALC, LDLDIRECT  Physical Exam:    VS:  BP 124/62   Pulse 73   Ht 5\' 8"  (1.727 m)   Wt 197 lb (89.4 kg)  SpO2 90%   BMI 29.95 kg/m     Wt Readings from Last 3 Encounters:  01/19/20 197 lb (89.4 kg)  12/17/19 197 lb (89.4 kg)  10/20/19 195 lb 12.8 oz (88.8 kg)     GEN: Patient is in no acute distress HEENT: Normal NECK: No JVD; No carotid bruits LYMPHATICS: No lymphadenopathy CARDIAC: Hear sounds regular, 2/6 systolic murmur at the apex. RESPIRATORY:  Clear to auscultation without rales, wheezing or rhonchi  ABDOMEN: Soft, non-tender, non-distended MUSCULOSKELETAL:  No edema; No deformity  SKIN: Warm and dry NEUROLOGIC:  Alert and oriented x 3 PSYCHIATRIC:  Normal affect   Signed, Jenean Lindau, MD  01/19/2020 10:30 AM    [facility]

## 2020-01-19 NOTE — Patient Instructions (Signed)
Medication Instructions:  No medication changes *If you need a refill on your cardiac medications before your next appointment, please call your pharmacy*   Lab Work: You need to have labs done when you are fasting.  You can come Monday through Friday 8:30 am to 12:00 pm and 1:15 to 4:30. You do not need to make an appointment as the order has already been placed. The labs you are going to have done are BMET, CBC, TSH, LFT and Lipids.  If you have labs (blood work) drawn today and your tests are completely normal, you will receive your results only by: . MyChart Message (if you have MyChart) OR . A paper copy in the mail If you have any lab test that is abnormal or we need to change your treatment, we will call you to review the results.   Testing/Procedures: None ordered   Follow-Up: At [facility], you and your health needs are our priority.  As part of our continuing mission to provide you with exceptional heart care, we have created designated Provider Care Teams.  These Care Teams include your primary Cardiologist (physician) and Advanced Practice Providers (APPs -  Physician Assistants and Nurse Practitioners) who all work together to provide you with the care you need, when you need it.  We recommend signing up for the patient portal called "MyChart".  Sign up information is provided on this After Visit Summary.  MyChart is used to connect with patients for Virtual Visits (Telemedicine).  Patients are able to view lab/test results, encounter notes, upcoming appointments, etc.  Non-urgent messages can be sent to your provider as well.   To learn more about what you can do with MyChart, go to [email or web address].    Your next appointment:   6 month(s)  The format for your next appointment:   In Person  Provider:   Rajan Revankar, MD   Other Instructions NA  

## 2020-01-22 DIAGNOSIS — R030 Elevated blood-pressure reading, without diagnosis of hypertension: Secondary | ICD-10-CM | POA: Insufficient documentation

## 2020-01-22 HISTORY — DX: Elevated blood-pressure reading, without diagnosis of hypertension: R03.0

## 2020-01-23 LAB — CBC WITH DIFFERENTIAL/PLATELET
Basophils Absolute: 0.1 10*3/uL (ref 0.0–0.2)
Basos: 1 %
EOS (ABSOLUTE): 0.3 10*3/uL (ref 0.0–0.4)
Eos: 4 %
Hematocrit: 43.7 % (ref 37.5–51.0)
Hemoglobin: 14.6 g/dL (ref 13.0–17.7)
Immature Grans (Abs): 0 10*3/uL (ref 0.0–0.1)
Immature Granulocytes: 0 %
Lymphocytes Absolute: 2.3 10*3/uL (ref 0.7–3.1)
Lymphs: 32 %
MCH: 29.6 pg (ref 26.6–33.0)
MCHC: 33.4 g/dL (ref 31.5–35.7)
MCV: 89 fL (ref 79–97)
Monocytes Absolute: 0.7 10*3/uL (ref 0.1–0.9)
Monocytes: 10 %
Neutrophils Absolute: 3.9 10*3/uL (ref 1.4–7.0)
Neutrophils: 53 %
Platelets: 245 10*3/uL (ref 150–450)
RBC: 4.93 x10E6/uL (ref 4.14–5.80)
RDW: 13.1 % (ref 11.6–15.4)
WBC: 7.2 10*3/uL (ref 3.4–10.8)

## 2020-01-23 LAB — LIPID PANEL
Chol/HDL Ratio: 3.4 ratio (ref 0.0–5.0)
Cholesterol, Total: 150 mg/dL (ref 100–199)
HDL: 44 mg/dL (ref >39)
LDL Chol Calc (NIH): 83 mg/dL (ref 0–99)
Triglycerides: 128 mg/dL (ref 0–149)
VLDL Cholesterol Cal: 23 mg/dL (ref 5–40)

## 2020-01-23 LAB — HEPATIC FUNCTION PANEL
ALT: 6 IU/L (ref 0–44)
AST: 16 IU/L (ref 0–40)
Albumin: 4.3 g/dL (ref 3.7–4.7)
Alkaline Phosphatase: 129 IU/L — ABNORMAL HIGH (ref 39–117)
Bilirubin Total: 0.4 mg/dL (ref 0.0–1.2)
Bilirubin, Direct: 0.14 mg/dL (ref 0.00–0.40)
Total Protein: 7.1 g/dL (ref 6.0–8.5)

## 2020-01-23 LAB — BASIC METABOLIC PANEL
BUN/Creatinine Ratio: 14 (ref 10–24)
BUN: 15 mg/dL (ref 8–27)
CO2: 25 mmol/L (ref 20–29)
Calcium: 9.6 mg/dL (ref 8.6–10.2)
Chloride: 100 mmol/L (ref 96–106)
Creatinine, Ser: 1.06 mg/dL (ref 0.76–1.27)
GFR calc Af Amer: 81 mL/min/{1.73_m2} (ref >59)
GFR calc non Af Amer: 70 mL/min/{1.73_m2} (ref >59)
Glucose: 87 mg/dL (ref 65–99)
Potassium: 4.2 mmol/L (ref 3.5–5.2)
Sodium: 140 mmol/L (ref 134–144)

## 2020-01-23 LAB — TSH: TSH: 2.92 u[IU]/mL (ref 0.450–4.500)

## 2020-02-23 DIAGNOSIS — M19071 Primary osteoarthritis, right ankle and foot: Secondary | ICD-10-CM | POA: Insufficient documentation

## 2020-02-23 HISTORY — DX: Primary osteoarthritis, right ankle and foot: M19.071

## 2020-03-01 ENCOUNTER — Ambulatory Visit: Payer: Medicare HMO | Admitting: Cardiology

## 2020-08-02 ENCOUNTER — Ambulatory Visit: Payer: Medicare HMO | Admitting: Cardiology

## 2020-08-03 ENCOUNTER — Encounter: Payer: Self-pay | Admitting: Cardiology

## 2020-08-03 ENCOUNTER — Ambulatory Visit: Payer: Medicare HMO | Admitting: Cardiology

## 2020-08-03 ENCOUNTER — Other Ambulatory Visit: Payer: Self-pay

## 2020-08-03 VITALS — BP 151/69 | HR 68 | Ht 68.0 in | Wt 193.4 lb

## 2020-08-03 DIAGNOSIS — I2510 Atherosclerotic heart disease of native coronary artery without angina pectoris: Secondary | ICD-10-CM | POA: Diagnosis not present

## 2020-08-03 DIAGNOSIS — E7800 Pure hypercholesterolemia, unspecified: Secondary | ICD-10-CM

## 2020-08-03 DIAGNOSIS — Z79899 Other long term (current) drug therapy: Secondary | ICD-10-CM

## 2020-08-03 DIAGNOSIS — I10 Essential (primary) hypertension: Secondary | ICD-10-CM | POA: Insufficient documentation

## 2020-08-03 HISTORY — DX: Essential (primary) hypertension: I10

## 2020-08-03 NOTE — Patient Instructions (Signed)
Medication Instructions:  No medication changes. *If you need a refill on your cardiac medications before your next appointment, please call your pharmacy*   Lab Work: Your physician recommends that you have labs done in the next few days. Your test included  basic metabolic panel, complete blood count, TSH, liver function and lipids.  You need to have labs done when you are fasting.  You can come Monday through Friday 8:30 am to 12:00 pm and 1:15 to 4:30. You do not need to make an appointment as the order has already been placed.    If you have labs (blood work) drawn today and your tests are completely normal, you will receive your results only by: Marland Kitchen MyChart Message (if you have MyChart) OR . A paper copy in the mail If you have any lab test that is abnormal or we need to change your treatment, we will call you to review the results.   Testing/Procedures: None ordered   Follow-Up: At [facility], you and your health needs are our priority.  As part of our continuing mission to provide you with exceptional heart care, we have created designated Provider Care Teams.  These Care Teams include your primary Cardiologist (physician) and Advanced Practice Providers (APPs -  Physician Assistants and Nurse Practitioners) who all work together to provide you with the care you need, when you need it.  We recommend signing up for the patient portal called "MyChart".  Sign up information is provided on this After Visit Summary.  MyChart is used to connect with patients for Virtual Visits (Telemedicine).  Patients are able to view lab/test results, encounter notes, upcoming appointments, etc.  Non-urgent messages can be sent to your provider as well.   To learn more about what you can do with MyChart, go to [email or web address].    Your next appointment:   6 month(s)  The format for your next appointment:   In Person  Provider:   Jyl Heinz, MD   Other Instructions NA

## 2020-08-03 NOTE — Progress Notes (Signed)
Cardiology Office Note:    Date:  08/03/2020   ID:  Manuel Sanchez, DOB [birth year], MRN [id number]  PCP:  Raina Mina., MD  Cardiologist:  Jenean Lindau, MD   Referring MD: Raina Mina., MD    ASSESSMENT:    1. Coronary artery disease involving native coronary artery of native heart without angina pectoris   2. Hypercholesterolemia   3. High risk medication use   4. Essential hypertension    PLAN:    In order of problems listed above:  1. Coronary artery disease: Secondary prevention stressed with the patient.  Importance of compliance with diet medication stressed any vocalized understanding.  He walks on a regular basis.  EKG reveals sinus rhythm and nonspecific ST-T changes. 2. Essential hypertension: Blood pressure stable and patient is taking beta-blocker and blood pressure is optimal.  He has an element of whitecoat hypertension. 3. Mixed dyslipidemia: Diet was emphasized and he plans to follow it well.  He'll be back in the next few days for blood work.  Labs from March seen on KPN sheet were discussed with him at length and questions were answered to satisfaction. 4. Patient will be seen in follow-up appointment in 6 months or earlier if the patient has any concerns    Medication Adjustments/Labs and Tests Ordered: Current medicines are reviewed at length with the patient today.  Concerns regarding medicines are outlined above.  No orders of the defined types were placed in this encounter.  No orders of the defined types were placed in this encounter.    No chief complaint on file.    History of Present Illness:    Manuel Sanchez is a 74 y.o. male.  Patient has past medical history of essential hypertension mixed dyslipidemia and coronary artery disease.  He denies any problems at this time and takes care of activities of daily living.  No chest pain orthopnea or PND.  At the time of my evaluation, the patient is alert awake oriented and in no  distress.  Past Medical History:  Diagnosis Date  . Arthritis 07/23/2019   Rt acromioclavicular joint  . CAD (coronary artery disease) 07/23/2019  . COPD (chronic obstructive pulmonary disease) ([place]) 07/23/2019  . Cyst of left kidney 07/23/2019  . Hypercholesterolemia 07/23/2019  . 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 Peripheral vascular disease, unspecified ([place]) 07/23/2019  . Skin cancer of nose 07/23/2019  . Vitamin B12 deficiency 07/23/2019    Past Surgical History:  Procedure Laterality Date  . NO PAST SURGERIES      Current Medications: Current Meds  Medication Sig  . albuterol (VENTOLIN HFA) 108 (90 BASE) MCG/ACT inhaler Inhale 2 puffs into the lungs every 4 (four) hours as needed for wheezing or shortness of breath.   Marland Kitchen aspirin EC 81 MG tablet Take 1 tablet (81 mg total) by mouth daily.  Marland Kitchen atorvastatin (LIPITOR) 20 MG tablet Take 20 mg by mouth daily.  . budesonide-formoterol (SYMBICORT) 160-4.5 MCG/ACT inhaler Take 2 puffs first thing in am and then another 2 puffs about 12 hours later.  . cholecalciferol (VITAMIN D) 1000 UNITS tablet Take 1,000 Units by mouth daily.  . Cyanocobalamin (B-12) 2500 MCG TABS Take 1 tablet by mouth every morning.  . ferrous sulfate 325 (65 FE) MG EC tablet Take by  mouth.  . montelukast (SINGULAIR) 10 MG tablet Take 10 mg by mouth daily.  Marland Kitchen triamcinolone ointment (KENALOG) 0.1 % Apply 1 application topically as needed.     Allergies:   Codeine and Levaquin [levofloxacin in d5w]   Social History   Socioeconomic History  . Marital status: Married    Spouse name: Not on file  . Number of children: 4  . Years of education: Not on file  . Highest education level: Not on file  Occupational History  . Occupation: Retired  Tobacco Use  . Smoking status: Former Smoker    Packs/day: 0.75     Years: 45.00    Pack years: 33.75    Types: Cigarettes    Quit date: 11/20/2002    Years since quitting: 17.7  . Smokeless tobacco: Never Used  Substance and Sexual Activity  . Alcohol use: No  . Drug use: No  . Sexual activity: Not on file  Other Topics Concern  . Not on file  Social History Narrative  . Not on file   Social Determinants of Health   Financial Resource Strain:   . Difficulty of Paying Living Expenses: Not on file  Food Insecurity:   . Worried About Charity fundraiser in the Last Year: Not on file  . Ran Out of Food in the Last Year: Not on file  Transportation Needs:   . Lack of Transportation (Medical): Not on file  . Lack of Transportation (Non-Medical): Not on file  Physical Activity:   . Days of Exercise per Week: Not on file  . Minutes of Exercise per Session: Not on file  Stress:   . Feeling of Stress : Not on file  Social Connections:   . Frequency of Communication with Friends and Family: Not on file  . Frequency of Social Gatherings with Friends and Family: Not on file  . Attends Religious Services: Not on file  . Active Member of Clubs or Organizations: Not on file  . Attends Archivist Meetings: Not on file  . Marital Status: Not on file     Family History: The patient's family history includes Colon cancer in his father; Heart disease in his mother; Stroke in his mother.  ROS:   Please see the history of present illness.    All other systems reviewed and are negative.  EKGs/Labs/Other Studies Reviewed:    The following studies were reviewed today: I discussed my findings with the patient at length.   Recent Labs: 01/22/2020: ALT 6; BUN 15; Creatinine, Ser 1.06; Hemoglobin 14.6; Platelets 245; Potassium 4.2; Sodium 140; TSH 2.920  Recent Lipid Panel    Component Value Date/Time   CHOL 150 01/22/2020 0835   TRIG 128 01/22/2020 0835   HDL 44 01/22/2020 0835   CHOLHDL 3.4 01/22/2020 0835   LDLCALC 83 01/22/2020 0835     Physical Exam:    VS:  BP (!) 151/69   Pulse 68   Ht 5\' 8"  (1.727 m)   Wt 193 lb 6.4 oz (87.7 kg)   SpO2 93%   BMI 29.41 kg/m     Wt Readings from Last 3 Encounters:  08/03/20 193 lb 6.4 oz (87.7 kg)  01/19/20 197 lb (89.4 kg)  12/17/19 197 lb (89.4 kg)     GEN: Patient is in no acute distress HEENT: Normal NECK: No JVD; No carotid bruits LYMPHATICS: No lymphadenopathy CARDIAC: Hear sounds regular, 2/6 systolic murmur at the apex. RESPIRATORY:  Clear to auscultation without rales, wheezing  or rhonchi  ABDOMEN: Soft, non-tender, non-distended MUSCULOSKELETAL:  No edema; No deformity  SKIN: Warm and dry NEUROLOGIC:  Alert and oriented x 3 PSYCHIATRIC:  Normal affect   Signed, Jenean Lindau, MD  08/03/2020 3:37 PM    [facility]

## 2020-08-03 NOTE — Addendum Note (Signed)
Addended by: Truddie Hidden on: 08/03/2020 03:58 PM   Modules accepted: Orders

## 2020-08-19 DIAGNOSIS — K219 Gastro-esophageal reflux disease without esophagitis: Secondary | ICD-10-CM | POA: Insufficient documentation

## 2020-08-19 HISTORY — DX: Gastro-esophageal reflux disease without esophagitis: K21.9

## 2021-01-25 ENCOUNTER — Other Ambulatory Visit: Payer: Self-pay

## 2021-01-26 ENCOUNTER — Ambulatory Visit: Payer: Medicare HMO | Admitting: Cardiology

## 2023-04-11 DIAGNOSIS — R918 Other nonspecific abnormal finding of lung field: Secondary | ICD-10-CM

## 2023-04-11 HISTORY — DX: Other nonspecific abnormal finding of lung field: R91.8

## 2023-05-09 ENCOUNTER — Inpatient Hospital Stay: Payer: Medicare HMO | Attending: Oncology | Admitting: Oncology

## 2023-05-09 ENCOUNTER — Other Ambulatory Visit: Payer: Self-pay | Admitting: Oncology

## 2023-05-09 ENCOUNTER — Encounter: Payer: Self-pay | Admitting: Oncology

## 2023-05-09 ENCOUNTER — Inpatient Hospital Stay: Payer: Medicare HMO

## 2023-05-09 VITALS — BP 145/67 | HR 67 | Temp 98.3°F | Resp 20 | Ht 66.0 in | Wt 198.5 lb

## 2023-05-09 DIAGNOSIS — I739 Peripheral vascular disease, unspecified: Secondary | ICD-10-CM | POA: Diagnosis not present

## 2023-05-09 DIAGNOSIS — E538 Deficiency of other specified B group vitamins: Secondary | ICD-10-CM | POA: Diagnosis not present

## 2023-05-09 DIAGNOSIS — R918 Other nonspecific abnormal finding of lung field: Secondary | ICD-10-CM | POA: Insufficient documentation

## 2023-05-09 DIAGNOSIS — Z79899 Other long term (current) drug therapy: Secondary | ICD-10-CM | POA: Insufficient documentation

## 2023-05-09 DIAGNOSIS — I2510 Atherosclerotic heart disease of native coronary artery without angina pectoris: Secondary | ICD-10-CM | POA: Diagnosis not present

## 2023-05-09 DIAGNOSIS — R590 Localized enlarged lymph nodes: Secondary | ICD-10-CM | POA: Insufficient documentation

## 2023-05-09 DIAGNOSIS — R911 Solitary pulmonary nodule: Secondary | ICD-10-CM

## 2023-05-09 DIAGNOSIS — Z87891 Personal history of nicotine dependence: Secondary | ICD-10-CM | POA: Insufficient documentation

## 2023-05-09 DIAGNOSIS — Z7951 Long term (current) use of inhaled steroids: Secondary | ICD-10-CM | POA: Diagnosis not present

## 2023-05-09 DIAGNOSIS — J449 Chronic obstructive pulmonary disease, unspecified: Secondary | ICD-10-CM | POA: Diagnosis not present

## 2023-05-09 DIAGNOSIS — Z85828 Personal history of other malignant neoplasm of skin: Secondary | ICD-10-CM | POA: Diagnosis not present

## 2023-05-09 DIAGNOSIS — K219 Gastro-esophageal reflux disease without esophagitis: Secondary | ICD-10-CM | POA: Insufficient documentation

## 2023-05-09 DIAGNOSIS — Z7982 Long term (current) use of aspirin: Secondary | ICD-10-CM | POA: Insufficient documentation

## 2023-05-09 LAB — CMP (CANCER CENTER ONLY)
ALT: 11 U/L (ref 0–44)
AST: 18 U/L (ref 15–41)
Albumin: 3.8 g/dL (ref 3.5–5.0)
Alkaline Phosphatase: 88 U/L (ref 38–126)
Anion gap: 6 (ref 5–15)
BUN: 19 mg/dL (ref 8–23)
CO2: 25 mmol/L (ref 22–32)
Calcium: 9.1 mg/dL (ref 8.9–10.3)
Chloride: 106 mmol/L (ref 98–111)
Creatinine: 1 mg/dL (ref 0.61–1.24)
GFR, Estimated: >60 mL/min (ref >60)
Glucose, Bld: 166 mg/dL — ABNORMAL HIGH (ref 70–99)
Potassium: 3.6 mmol/L (ref 3.5–5.1)
Sodium: 137 mmol/L (ref 135–145)
Total Bilirubin: 0.3 mg/dL (ref 0.3–1.2)
Total Protein: 7 g/dL (ref 6.5–8.1)

## 2023-05-09 LAB — CBC AND DIFFERENTIAL
HCT: 38 — AB (ref 41–53)
Hemoglobin: 12.9 — AB (ref 13.5–17.5)
Neutrophils Absolute: 8.03
Platelets: 219 10*3/uL (ref 150–400)
WBC: 10.3

## 2023-05-09 LAB — CBC: RBC: 4.27 (ref 3.87–5.11)

## 2023-05-09 NOTE — Progress Notes (Signed)
[facility]  [address] [place],  [place]  [address] [phone]  Clinic Day:  05/09/2023  Referring physician: Gordan Payment., MD   HISTORY OF PRESENT ILLNESS:  The patient is a 77 y.o. 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  For the past few years, this gentleman has had issues with shortness of breath.  However, in May 2024, his shortness of breath became extremely prominent to where he went to the emergency room for further evaluation.  While there, a chest CT was done, which revealed a 2.3 x 1.8 cm lesion in his left upper lobe.  Furthermore, suspicious hilar lymphadenopathy was seen.  This led to a PET scan being done a few days later, which revealed his primary left upper lobe mass, as well as hilar and mediastinal lymph nodes, being hypermetabolic in nature.  There was no evidence of distant metastasis.  The patient has yet to undergo a lung biopsy to prove the histology of his disease.  Over these past months, he has had a productive cough of clear phlegm.  He denies having any hemoptysis.  He also denies having any weight loss.  Of note, he has smoked at least a pack and a half of cigarettes daily for 53 years.   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 BMI 29.19   COPD (chronic obstructive pulmonary disease) (HCC) 07/23/2019    Coronary artery disease involving native coronary artery of native heart without angina pectoris 04/22/2015   Formatting of this note might be different from the original. Calcification noted on CT of his chest in 2016 Formatting of this note might be different from the original. 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 Bernadette Hoit in past.  Will see again.   Malaise and fatigue 04/24/2016   Mass of lower lobe of left lung 09/10/2019   Formatting of this note might be different from the original. Will need a follow up CT Pt aware   Peripheral vascular disease (HCC) 04/24/2016   Formatting of this note might be different from the original. 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     albuterol (VENTOLIN HFA) 108 (90 BASE) MCG/ACT inhaler Inhale 2 puffs  into the lungs every 4 (four) hours as needed for wheezing or shortness of breath.      aspirin EC 81 MG tablet Take 1 tablet (81 mg total) by mouth daily. 90 tablet 3   atorvastatin (LIPITOR) 20 MG tablet Take 20 mg by mouth daily.     budesonide-formoterol (SYMBICORT) 160-4.5 MCG/ACT inhaler Inhale 2 puffs into the lungs 2 (two) times daily.     cholecalciferol (VITAMIN D) 1000 UNITS tablet Take 1,000 Units by mouth daily.     Cyanocobalamin (B-12) 2500 MCG TABS Take 1 tablet by mouth every morning.     ferrous sulfate 325 (65 FE) MG EC tablet Take 325 mg by mouth daily with breakfast.     metoprolol tartrate (LOPRESSOR) 50 MG tablet Take 2 tablets (100 mg total) by mouth once for 1 dose. IF HR is 55 or greater, take 2 tablets 2 hours prior to procedure 2 tablet 0   montelukast (SINGULAIR) 10 MG tablet Take 10 mg by mouth daily.     nitroGLYCERIN (NITROSTAT) 0.4 MG SL tablet Place 1 tablet (0.4 mg total) under the tongue every 5 (five) minutes as needed. Take as needed for chest pain 25 tablet 3   omeprazole (PRILOSEC) 40 MG capsule Take 40 mg by mouth daily.     triamcinolone ointment (KENALOG) 0.1 % Apply 1 application topically as needed.     No current facility-administered medications for this visit.    ALLERGIES:   Allergies  Allergen Reactions   Codeine Nausea And Vomiting and Other (See Comments)    Hallucinations    Levaquin [Levofloxacin In D5w] Itching    FAMILY HISTORY:   Family History  Problem Relation Age of Onset   Heart disease Mother    Stroke Mother    Hypertension Mother    Heart attack Mother    Hearing loss Mother    Colon cancer Father    Stomach cancer Sister    Lung cancer Sister     SOCIAL HISTORY:  The patient was born and raised in [place].  He currently lives in [place] with his wife of 33 years.  They have 4 children, 7 grandchildren, and 2 great-grandchildren.  He worked in a [employer] for 35 years.  His smoking history is as mentioned  per HPI.  He has not consumed any alcohol in multiple decades.  REVIEW OF SYSTEMS:  Review of Systems  Constitutional:  Negative for fatigue, fever and unexpected weight change.  HENT:   Positive for hearing loss.   Eyes:  Positive for eye problems.  Respiratory:  Positive for cough and shortness of breath. Negative for chest tightness and hemoptysis.   Cardiovascular:  Negative for chest pain and palpitations.  Gastrointestinal:  Negative for abdominal distention, abdominal pain, blood in stool, constipation, diarrhea, nausea and vomiting.  Genitourinary:  Negative for dysuria, frequency and hematuria.   Musculoskeletal:  Negative for arthralgias, back pain and myalgias.  Skin:  Negative for itching and rash.  Neurological:  Negative for dizziness, headaches and light-headedness.  Psychiatric/Behavioral:  Negative for depression and suicidal ideas. The patient is not nervous/anxious.     PHYSICAL EXAM:  Blood pressure (!) 145/67, pulse 67, temperature 98.3 F (36.8 C), resp. rate 20, height 5\' 6"  (1.676 m), weight 198 lb 8 oz (90 kg), SpO2 94 %. Wt Readings from Last 3 Encounters:  05/09/23 198 lb 8 oz (90 kg)  08/03/20 193 lb 6.4 oz (87.7 kg)  01/19/20 197 lb (89.4 kg)   Body mass index is 32.04 kg/m. Performance status (ECOG): 1 - Symptomatic but completely ambulatory Physical Exam Constitutional:      Appearance: Normal appearance. He is not ill-appearing.  HENT:     Mouth/Throat:     Mouth: Mucous membranes are moist.     Pharynx: Oropharynx is clear. No oropharyngeal exudate or posterior oropharyngeal erythema.  Cardiovascular:     Rate and Rhythm: Normal rate and regular rhythm.     Heart sounds: No murmur heard.    No friction rub. No gallop.  Pulmonary:     Effort: Pulmonary effort is normal. No respiratory distress.     Breath sounds: Normal breath sounds. No wheezing, rhonchi or rales.  Abdominal:     General: Bowel sounds are normal. There is no distension.      Palpations: Abdomen is soft. There is no mass.     Tenderness: There is no abdominal tenderness.  Musculoskeletal:        General: No swelling.     Right lower leg: No edema.     Left lower leg: No edema.  Lymphadenopathy:     Cervical: No cervical adenopathy.     Upper Body:     Right upper body: No supraclavicular or axillary adenopathy.     Left upper body: No supraclavicular or axillary adenopathy.     Lower Body: No right inguinal adenopathy. No left inguinal adenopathy.  Skin:    General: Skin is warm.     Coloration: Skin is not jaundiced.     Findings: No lesion or rash.  Neurological:     General: No focal deficit present.     Mental Status: He is alert and oriented to person, place, and time. Mental status is at baseline.  Psychiatric:        Mood and Affect: Mood normal.        Behavior: Behavior normal.        Thought Content: Thought content normal.     LABS:      Latest Ref Rng & Units 05/09/2023   12:00 AM 01/22/2020    8:35 AM  CBC  WBC  10.3     7.2   Hemoglobin 13.5 - 17.5 12.9     14.6   Hematocrit 41 - 53 38     43.7   Platelets 150 - 400 K/uL 219     245      This result is from an external source.      Latest Ref Rng & Units 05/09/2023   10:05 AM 01/22/2020    8:35 AM 12/01/2019    8:54 AM  CMP  Glucose 70 - 99 mg/dL 657  87  846   BUN 8 - 23 mg/dL 19  15  14    Creatinine 0.61 - 1.24 mg/dL 9.62  9.52  8.41   Sodium 135 - 145 mmol/L 137  140  139   Potassium 3.5 - 5.1 mmol/L  3.6  4.2  3.9   Chloride 98 - 111 mmol/L 106  100  100   CO2 22 - 32 mmol/L 25  25  25    Calcium 8.9 - 10.3 mg/dL 9.1  9.6  9.7   Total Protein 6.5 - 8.1 g/dL 7.0  7.1    Total Bilirubin 0.3 - 1.2 mg/dL 0.3  0.4    Alkaline Phos 38 - 126 U/L 88  129    AST 15 - 41 U/L 18  16    ALT 0 - 44 U/L 11  6      ASSESSMENT & PLAN:  A 77 y.o. male who I was asked to consult upon for clinically appears to be stage IIIA non-small cell lung cancer. The IIIA designation is based upon  his PET scan showing mediastinal nodal disease.  I did speak with pulmonology, who has graciously moved his appointment up to this afternoon to discuss having his lung biopsy done as quickly as possible.  To complete his staging, I will also have this gentleman undergo a brain MRI before his next visit to ensure there is no occult evidence of CNS metastasis.  I will tentatively see this patient back in 2 weeks to go over his lung biopsy and brain MRI images, which will be used to formulate his lung cancer treatment plan.  The patient and his family understand all the plans discussed today and are in agreement with them.  I do appreciate Gordan Payment., MD for his new consult.   Adaora Mchaney Kirby Funk, MD

## 2023-05-10 ENCOUNTER — Inpatient Hospital Stay: Payer: Medicare HMO | Admitting: Licensed Clinical Social Worker

## 2023-05-10 ENCOUNTER — Telehealth: Payer: Self-pay | Admitting: Oncology

## 2023-05-10 NOTE — Telephone Encounter (Signed)
Patient has been scheduled. Aware of appt date and time     Scheduling Message Entered by Rennis Harding A on 05/09/2023 at  2:19 PM Priority: Routine <No visit type provided>  Department: [facility]  Provider:  Scheduling Notes:  Brain MRI asap  Next appt on 05-23-23 at 12 noon

## 2023-05-10 NOTE — Progress Notes (Signed)
[facility] Clinical Social Work  Clinical Social Work was referred by medical provider for assessment of psychosocial needs.  Clinical Social Worker contacted caregiver by phone to offer support and assess for needs.   Spoke with pt's wife and introduced self and support services. Per wife, biggest concern is increased expenses due to medical bills as they live on a fixed income. CSW discussed general resources as well as potential avenues for support once there is an official diagnosis and treatment plan.  CSW mailed information to pt/wife today per wife's request.    Merlyn Albert, LCSW  Clinical Social Worker [facility]

## 2023-05-23 ENCOUNTER — Other Ambulatory Visit: Payer: Self-pay | Admitting: Oncology

## 2023-05-23 ENCOUNTER — Inpatient Hospital Stay: Payer: Medicare HMO | Attending: Oncology | Admitting: Oncology

## 2023-05-23 VITALS — BP 123/69 | HR 75 | Temp 98.2°F | Resp 18 | Ht 66.0 in | Wt 196.9 lb

## 2023-05-23 DIAGNOSIS — E7800 Pure hypercholesterolemia, unspecified: Secondary | ICD-10-CM | POA: Insufficient documentation

## 2023-05-23 DIAGNOSIS — E785 Hyperlipidemia, unspecified: Secondary | ICD-10-CM | POA: Insufficient documentation

## 2023-05-23 DIAGNOSIS — J449 Chronic obstructive pulmonary disease, unspecified: Secondary | ICD-10-CM | POA: Insufficient documentation

## 2023-05-23 DIAGNOSIS — Z5111 Encounter for antineoplastic chemotherapy: Secondary | ICD-10-CM | POA: Insufficient documentation

## 2023-05-23 DIAGNOSIS — I10 Essential (primary) hypertension: Secondary | ICD-10-CM | POA: Insufficient documentation

## 2023-05-23 DIAGNOSIS — R911 Solitary pulmonary nodule: Secondary | ICD-10-CM

## 2023-05-23 DIAGNOSIS — Z85828 Personal history of other malignant neoplasm of skin: Secondary | ICD-10-CM | POA: Insufficient documentation

## 2023-05-23 DIAGNOSIS — R630 Anorexia: Secondary | ICD-10-CM | POA: Insufficient documentation

## 2023-05-23 DIAGNOSIS — G629 Polyneuropathy, unspecified: Secondary | ICD-10-CM | POA: Insufficient documentation

## 2023-05-23 DIAGNOSIS — R112 Nausea with vomiting, unspecified: Secondary | ICD-10-CM | POA: Insufficient documentation

## 2023-05-23 DIAGNOSIS — R918 Other nonspecific abnormal finding of lung field: Secondary | ICD-10-CM | POA: Diagnosis not present

## 2023-05-23 DIAGNOSIS — Z7982 Long term (current) use of aspirin: Secondary | ICD-10-CM | POA: Insufficient documentation

## 2023-05-23 DIAGNOSIS — C3412 Malignant neoplasm of upper lobe, left bronchus or lung: Secondary | ICD-10-CM | POA: Insufficient documentation

## 2023-05-23 DIAGNOSIS — I2510 Atherosclerotic heart disease of native coronary artery without angina pectoris: Secondary | ICD-10-CM | POA: Insufficient documentation

## 2023-05-23 DIAGNOSIS — K219 Gastro-esophageal reflux disease without esophagitis: Secondary | ICD-10-CM | POA: Insufficient documentation

## 2023-05-23 DIAGNOSIS — Z801 Family history of malignant neoplasm of trachea, bronchus and lung: Secondary | ICD-10-CM | POA: Insufficient documentation

## 2023-05-23 DIAGNOSIS — Z79899 Other long term (current) drug therapy: Secondary | ICD-10-CM | POA: Insufficient documentation

## 2023-05-23 DIAGNOSIS — I739 Peripheral vascular disease, unspecified: Secondary | ICD-10-CM | POA: Insufficient documentation

## 2023-05-23 DIAGNOSIS — Z7951 Long term (current) use of inhaled steroids: Secondary | ICD-10-CM | POA: Insufficient documentation

## 2023-05-23 DIAGNOSIS — Z87891 Personal history of nicotine dependence: Secondary | ICD-10-CM | POA: Insufficient documentation

## 2023-05-23 DIAGNOSIS — Z800 Family history of malignant neoplasm of digestive organs: Secondary | ICD-10-CM | POA: Insufficient documentation

## 2023-05-23 DIAGNOSIS — R197 Diarrhea, unspecified: Secondary | ICD-10-CM | POA: Insufficient documentation

## 2023-05-23 DIAGNOSIS — M129 Arthropathy, unspecified: Secondary | ICD-10-CM | POA: Insufficient documentation

## 2023-05-23 NOTE — Progress Notes (Signed)
[facility]  [address] [place],  [place]  [address] [phone]  Clinic Day:  05/23/2023  Referring physician: Gordan Payment., MD   HISTORY OF PRESENT ILLNESS:  The patient is a 77 y.o. male  who I recently began seeing for what clinically appears to be stage IIIA non-small cell lung cancer.  This is based upon him having a left upper lobe lung mass, as well as hypermetabolic lymph nodes in his mediastinum.  He comes in today to go over his brain MRI results.  Of note, my hope was this patient would have already undergone his lung biopsy by today.  However, it is not scheduled until Tuesday, July 9th.  Overall, the patient claims to be doing fairly well.  He denies having any worsening respiratory symptoms which concern him for progression of his likely lung cancer.    PHYSICAL EXAM:  Blood pressure 123/69, pulse 75, temperature 98.2 F (36.8 C), resp. rate 18, height 5\' 6"  (1.676 m), weight 196 lb 14.4 oz (89.3 kg), SpO2 93 %. Wt Readings from Last 3 Encounters:  05/23/23 196 lb 14.4 oz (89.3 kg)  05/09/23 198 lb 8 oz (90 kg)  08/03/20 193 lb 6.4 oz (87.7 kg)   Body mass index is 31.78 kg/m. Performance status (ECOG): 1 - Symptomatic but completely ambulatory Physical Exam Constitutional:      Appearance: Normal appearance. He is not ill-appearing.  HENT:     Mouth/Throat:     Mouth: Mucous membranes are moist.     Pharynx: Oropharynx is clear. No oropharyngeal exudate or posterior oropharyngeal erythema.  Cardiovascular:     Rate and Rhythm: Normal rate and regular rhythm.     Heart sounds: No murmur heard.    No friction rub. No gallop.  Pulmonary:     Effort: Pulmonary effort is normal. No respiratory distress.     Breath sounds: Normal breath sounds. No wheezing, rhonchi or rales.  Abdominal:     General: Bowel sounds are normal. There is no distension.     Palpations: Abdomen is soft. There is no mass.     Tenderness: There is no  abdominal tenderness.  Musculoskeletal:        General: No swelling.     Right lower leg: No edema.     Left lower leg: No edema.  Lymphadenopathy:     Cervical: No cervical adenopathy.     Upper Body:     Right upper body: No supraclavicular or axillary adenopathy.     Left upper body: No supraclavicular or axillary adenopathy.     Lower Body: No right inguinal adenopathy. No left inguinal adenopathy.  Skin:    General: Skin is warm.     Coloration: Skin is not jaundiced.     Findings: No lesion or rash.  Neurological:     General: No focal deficit present.     Mental Status: He is alert and oriented to person, place, and time. Mental status is at baseline.  Psychiatric:        Mood and Affect: Mood normal.        Behavior: Behavior normal.        Thought Content: Thought content normal.    SCANS: His brain MRI reveals the following: FINDINGS: Brain: No acute infarction, hemorrhage, hydrocephalus, extra-axial collection or mass lesion. Scattered T2/FLAIR hyperintensities in the white matter, compatible with chronic microvascular ischemic disease. No pathologic enhancement.  Vascular: Major arterial flow voids are maintained at  the skull base.  Skull and upper cervical spine: Normal marrow signal.  Sinuses/Orbits: Clear sinuses. No acute orbital findings.  Other: Bilateral mastoid effusions.  IMPRESSION: No evidence of acute intracranial abnormality or metastatic disease.  LABS:      Latest Ref Rng & Units 05/09/2023   12:00 AM 01/22/2020    8:35 AM  CBC  WBC  10.3     7.2   Hemoglobin 13.5 - 17.5 12.9     14.6   Hematocrit 41 - 53 38     43.7   Platelets 150 - 400 K/uL 219     245      This result is from an external source.      Latest Ref Rng & Units 05/09/2023   10:05 AM 01/22/2020    8:35 AM 12/01/2019    8:54 AM  CMP  Glucose 70 - 99 mg/dL 630  87  160   BUN 8 - 23 mg/dL 19  15  14    Creatinine 0.61 - 1.24 mg/dL 1.09  3.23  5.57   Sodium 135 - 145  mmol/L 137  140  139   Potassium 3.5 - 5.1 mmol/L 3.6  4.2  3.9   Chloride 98 - 111 mmol/L 106  100  100   CO2 22 - 32 mmol/L 25  25  25    Calcium 8.9 - 10.3 mg/dL 9.1  9.6  9.7   Total Protein 6.5 - 8.1 g/dL 7.0  7.1    Total Bilirubin 0.3 - 1.2 mg/dL 0.3  0.4    Alkaline Phos 38 - 126 U/L 88  129    AST 15 - 41 U/L 18  16    ALT 0 - 44 U/L 11  6      ASSESSMENT & PLAN:  A 77 y.o. male who clinically appears to have stage IIIA non-small cell lung cancer.  In clinic today, I went over all of his brain MRI images with him and his family, for which they could see there is no evidence of CNS metastasis.  His PET scan showed no evidence of non-CNS distant metastasis.  It definitely appears this gentleman only has stage IIIA disease.  As mentioned previously, he is scheduled for his lung biopsy on July 9th.  I will see him back 1 week later to go over those biopsy results, which will be used to formulate his definitive treatment plan.  The patient and his family understand all the plans discussed today and are in agreement with them.  Richelle Glick Kirby Funk, MD

## 2023-05-25 ENCOUNTER — Telehealth: Payer: Self-pay | Admitting: Oncology

## 2023-05-25 NOTE — Telephone Encounter (Signed)
05/25/23 Spoke with patients wife and confirmed next appt

## 2023-05-29 DIAGNOSIS — R590 Localized enlarged lymph nodes: Secondary | ICD-10-CM

## 2023-05-29 HISTORY — DX: Localized enlarged lymph nodes: R59.0

## 2023-06-04 NOTE — Progress Notes (Unsigned)
[facility]  [address] [place],  [place]  [address] [phone]  Clinic Day:  06/05/2023  Referring physician: Gordan Payment., MD   HISTORY OF PRESENT ILLNESS:  The patient is a 77 y.o. male  who I recently began seeing for what clinically appears to be stage IIIA non-small cell lung cancer.  This is based upon him having a left upper lobe lung mass, as well as hypermetabolic lymph nodes in his mediastinum.  He comes in today to go over the results of his lung biopsy, which was done at [facility].  Since his last visit, the patient has been doing fairly well.  She does have intermittent shortness of breath, which his wife attributes to the heat outside.  Of note, a previous PET scan and brain MRI did not reveal evidence of distant metastasis.   PHYSICAL EXAM:  Blood pressure 139/60, pulse 61, temperature 98.3 F (36.8 C), resp. rate 18, height 5\' 6"  (1.676 m), weight 198 lb 3.2 oz (89.9 kg), SpO2 94%. Wt Readings from Last 3 Encounters:  06/05/23 198 lb 3.2 oz (89.9 kg)  05/23/23 196 lb 14.4 oz (89.3 kg)  05/09/23 198 lb 8 oz (90 kg)   Body mass index is 31.99 kg/m. Performance status (ECOG): 1 - Symptomatic but completely ambulatory Physical Exam Constitutional:      Appearance: Normal appearance. He is not ill-appearing.  HENT:     Mouth/Throat:     Mouth: Mucous membranes are moist.     Pharynx: Oropharynx is clear. No oropharyngeal exudate or posterior oropharyngeal erythema.  Cardiovascular:     Rate and Rhythm: Normal rate and regular rhythm.     Heart sounds: No murmur heard.    No friction rub. No gallop.  Pulmonary:     Effort: Pulmonary effort is normal. No respiratory distress.     Breath sounds: Normal breath sounds. No wheezing, rhonchi or rales.  Abdominal:     General: Bowel sounds are normal. There is no distension.     Palpations: Abdomen is soft. There is no mass.     Tenderness: There is no abdominal  tenderness.  Musculoskeletal:        General: No swelling.     Right lower leg: No edema.     Left lower leg: No edema.  Lymphadenopathy:     Cervical: No cervical adenopathy.     Upper Body:     Right upper body: No supraclavicular or axillary adenopathy.     Left upper body: No supraclavicular or axillary adenopathy.     Lower Body: No right inguinal adenopathy. No left inguinal adenopathy.  Skin:    General: Skin is warm.     Coloration: Skin is not jaundiced.     Findings: No lesion or rash.  Neurological:     General: No focal deficit present.     Mental Status: He is alert and oriented to person, place, and time. Mental status is at baseline.  Psychiatric:        Mood and Affect: Mood normal.        Behavior: Behavior normal.        Thought Content: Thought content normal.   PATHOLOGY:  His recent bronchoscopy with biopsy revealed the following: Final Diagnosis   Lymph node, station 11L, FNA (smears, cell block): Small cell carcinoma. See comment.    Electronically signed by Denny Peon, MD on 05/31/2023 at  8:40 AM  Diagnosis Comment  Fragments of tumor in the cellblock are positive for cytokeratin AE1/AE3, CD56, and synaptophysin.  A chromogranin stain appears largely negative.   LABS:      Latest Ref Rng & Units 05/09/2023   12:00 AM 01/22/2020    8:35 AM  CBC  WBC  10.3     7.2   Hemoglobin 13.5 - 17.5 12.9     14.6   Hematocrit 41 - 53 38     43.7   Platelets 150 - 400 K/uL 219     245      This result is from an external source.      Latest Ref Rng & Units 05/09/2023   10:05 AM 01/22/2020    8:35 AM 12/01/2019    8:54 AM  CMP  Glucose 70 - 99 mg/dL 782  87  956   BUN 8 - 23 mg/dL 19  15  14    Creatinine 0.61 - 1.24 mg/dL 2.13  0.86  5.78   Sodium 135 - 145 mmol/L 137  140  139   Potassium 3.5 - 5.1 mmol/L 3.6  4.2  3.9   Chloride 98 - 111 mmol/L 106  100  100   CO2 22 - 32 mmol/L 25  25  25    Calcium 8.9 - 10.3 mg/dL 9.1  9.6  9.7   Total  Protein 6.5 - 8.1 g/dL 7.0  7.1    Total Bilirubin 0.3 - 1.2 mg/dL 0.3  0.4    Alkaline Phos 38 - 126 U/L 88  129    AST 15 - 41 U/L 18  16    ALT 0 - 44 U/L 11  6      ASSESSMENT & PLAN:  A 77 y.o. male who has limited stage small cell lung cancer.  As mentioned previously, scans did not show any evidence of distant metastasis.  The his histology being small cell carcinoma, this gentleman's plan changes to a certain degree.  He will receive 4 cycles of cisplatin/etoposide, in conjunction with daily radiation, to hopefully cure him of his lung cancer.  The patient was made aware of the side effects which go along with his chemotherapy regimen, including nausea, hair loss, cytopenias, and fatigue.  He is scheduled to see radiation oncology within the forthcoming days so the treatment planning for his lung cancer can commence.  I will also have him see a local surgeon, who will place a port through which all of his future chemotherapy will be given.  I anticipate his first cycle of cisplatin/etoposide to commence within the next 2 weeks.  With recent data available showing a survival benefit with consolidation immunotherapy, durvalumab would be considered after his chemoradiation if his future scans show disease improvement.  I will tentatively see this patient back in 1 month, which will be around the time he heads into his second cycle of cisplatin/etoposide.  The patient and his family understand all the plans discussed today and are in agreement with them.    Josephus Harriger Kirby Funk, MD

## 2023-06-05 ENCOUNTER — Inpatient Hospital Stay: Payer: Medicare HMO | Admitting: Oncology

## 2023-06-05 ENCOUNTER — Other Ambulatory Visit: Payer: Self-pay | Admitting: Oncology

## 2023-06-05 ENCOUNTER — Encounter: Payer: Self-pay | Admitting: Oncology

## 2023-06-05 ENCOUNTER — Inpatient Hospital Stay: Payer: Medicare HMO

## 2023-06-05 VITALS — BP 139/60 | HR 61 | Temp 98.3°F | Resp 18 | Ht 66.0 in | Wt 198.2 lb

## 2023-06-05 DIAGNOSIS — R630 Anorexia: Secondary | ICD-10-CM | POA: Diagnosis not present

## 2023-06-05 DIAGNOSIS — I739 Peripheral vascular disease, unspecified: Secondary | ICD-10-CM | POA: Diagnosis not present

## 2023-06-05 DIAGNOSIS — Z7951 Long term (current) use of inhaled steroids: Secondary | ICD-10-CM | POA: Diagnosis not present

## 2023-06-05 DIAGNOSIS — R112 Nausea with vomiting, unspecified: Secondary | ICD-10-CM | POA: Diagnosis not present

## 2023-06-05 DIAGNOSIS — I10 Essential (primary) hypertension: Secondary | ICD-10-CM | POA: Diagnosis not present

## 2023-06-05 DIAGNOSIS — G629 Polyneuropathy, unspecified: Secondary | ICD-10-CM | POA: Diagnosis not present

## 2023-06-05 DIAGNOSIS — Z79899 Other long term (current) drug therapy: Secondary | ICD-10-CM | POA: Diagnosis not present

## 2023-06-05 DIAGNOSIS — Z800 Family history of malignant neoplasm of digestive organs: Secondary | ICD-10-CM | POA: Diagnosis not present

## 2023-06-05 DIAGNOSIS — J449 Chronic obstructive pulmonary disease, unspecified: Secondary | ICD-10-CM | POA: Diagnosis not present

## 2023-06-05 DIAGNOSIS — Z87891 Personal history of nicotine dependence: Secondary | ICD-10-CM | POA: Diagnosis not present

## 2023-06-05 DIAGNOSIS — I2510 Atherosclerotic heart disease of native coronary artery without angina pectoris: Secondary | ICD-10-CM | POA: Diagnosis not present

## 2023-06-05 DIAGNOSIS — Z85828 Personal history of other malignant neoplasm of skin: Secondary | ICD-10-CM | POA: Diagnosis not present

## 2023-06-05 DIAGNOSIS — C3412 Malignant neoplasm of upper lobe, left bronchus or lung: Secondary | ICD-10-CM | POA: Insufficient documentation

## 2023-06-05 DIAGNOSIS — R197 Diarrhea, unspecified: Secondary | ICD-10-CM | POA: Diagnosis not present

## 2023-06-05 DIAGNOSIS — E7800 Pure hypercholesterolemia, unspecified: Secondary | ICD-10-CM | POA: Diagnosis not present

## 2023-06-05 DIAGNOSIS — E785 Hyperlipidemia, unspecified: Secondary | ICD-10-CM | POA: Diagnosis not present

## 2023-06-05 DIAGNOSIS — R911 Solitary pulmonary nodule: Secondary | ICD-10-CM

## 2023-06-05 DIAGNOSIS — Z7982 Long term (current) use of aspirin: Secondary | ICD-10-CM | POA: Diagnosis not present

## 2023-06-05 DIAGNOSIS — Z5111 Encounter for antineoplastic chemotherapy: Secondary | ICD-10-CM | POA: Diagnosis not present

## 2023-06-05 DIAGNOSIS — M129 Arthropathy, unspecified: Secondary | ICD-10-CM | POA: Diagnosis not present

## 2023-06-05 DIAGNOSIS — K219 Gastro-esophageal reflux disease without esophagitis: Secondary | ICD-10-CM | POA: Diagnosis not present

## 2023-06-05 DIAGNOSIS — Z801 Family history of malignant neoplasm of trachea, bronchus and lung: Secondary | ICD-10-CM | POA: Diagnosis not present

## 2023-06-05 LAB — CMP (CANCER CENTER ONLY)
ALT: 20 U/L (ref 0–44)
AST: 31 U/L (ref 15–41)
Albumin: 3.6 g/dL (ref 3.5–5.0)
Alkaline Phosphatase: 96 U/L (ref 38–126)
Anion gap: 11 (ref 5–15)
BUN: 19 mg/dL (ref 8–23)
CO2: 22 mmol/L (ref 22–32)
Calcium: 9.1 mg/dL (ref 8.9–10.3)
Chloride: 104 mmol/L (ref 98–111)
Creatinine: 1.03 mg/dL (ref 0.61–1.24)
GFR, Estimated: >60 mL/min (ref >60)
Glucose, Bld: 133 mg/dL — ABNORMAL HIGH (ref 70–99)
Potassium: 4.4 mmol/L (ref 3.5–5.1)
Sodium: 137 mmol/L (ref 135–145)
Total Bilirubin: 0.8 mg/dL (ref 0.3–1.2)
Total Protein: 6.6 g/dL (ref 6.5–8.1)

## 2023-06-05 NOTE — Progress Notes (Signed)
 START ON PATHWAY REGIMEN - Small Cell Lung     A cycle is every 21 days:     Etoposide      Cisplatin   **Always confirm dose/schedule in your pharmacy ordering system**  Patient Characteristics: Newly Diagnosed, Preoperative or Nonsurgical Candidate (Clinical Staging), First Line, Limited Stage, Nonsurgical Candidate Therapeutic Status: Newly Diagnosed, Preoperative or Nonsurgical Candidate (Clinical Staging) AJCC T Category: cT1c AJCC N Category: cN2 AJCC M Category: cM0 AJCC 8 Stage Grouping: IIIA Stage Classification: Limited Surgical Candidacy: Nonsurgical Candidate Intent of Therapy: Curative Intent, Discussed with Patient

## 2023-06-06 ENCOUNTER — Encounter: Payer: Self-pay | Admitting: Oncology

## 2023-06-06 LAB — CBC: RBC: 4.56 (ref 3.87–5.11)

## 2023-06-06 LAB — CBC AND DIFFERENTIAL
HCT: 41 (ref 41–53)
Hemoglobin: 13.6 (ref 13.5–17.5)
Neutrophils Absolute: 5.66
Platelets: 221 10*3/uL (ref 150–400)
WBC: 9.6

## 2023-06-06 NOTE — Progress Notes (Deleted)
[facility] CARE CLINIC CONSULT NOTE [facility] [phone]   [phone]   Patient Care Team: Gordan Payment., MD as PCP - General (Internal Medicine) Revankar, Aundra Dubin, MD as PCP - Cardiology (Cardiology) Gordan Payment., MD (Internal Medicine) Marcellus Scott, MD as Referring Physician (Specialist)   Name of the patient: Manuel Sanchez  [id number]  [birth year]   Date of visit: 06/06/23  Diagnosis: Small cell lung cancer  Chief complaint/Reason for visit- Initial Meeting for [facility], preparing for starting chemotherapy  Heme/Onc history:  Oncology History  Small cell lung cancer, left upper lobe (HCC)  06/05/2023 Initial Diagnosis   Small cell lung cancer, left upper lobe (HCC)   06/18/2023 -  Chemotherapy   Patient is on Treatment Plan : LUNG SMALL CELL Cisplatin (80) D1 + Etoposide (100) D1-3 q21d       Interval history-  The patient presents to chemo care clinic today for initial meeting in preparation for starting chemotherapy. I introduced the chemo care clinic and we discussed that the role of the clinic is to assist those who are at an increased risk of emergency room visits and/or complications during the course of chemotherapy treatment. We discussed that the increased risk takes into account factors such as age, performance status, and co-morbidities. We also discussed that for some, this might include barriers to care such as not having a primary care provider, lack of insurance/transportation, or not being able to afford medications. We discussed that the goal of the program is to help prevent unplanned ER visits and help reduce complications during chemotherapy. We do this by discussing specific risk factors to each individual and identifying ways that we can help improve these risk factors and reduce barriers to care.   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 BMI 29.19   COPD (chronic obstructive pulmonary disease) (HCC) 07/23/2019   Coronary artery disease involving native coronary artery of native heart without angina pectoris 04/22/2015   Formatting of this note might be different from the original. Calcification noted on CT of his chest in 2016 Formatting of this note might be different from the original. 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 Bernadette Hoit in past.  Will see again.   Malaise and fatigue 04/24/2016   Mass of lower lobe of left lung 09/10/2019   Formatting of this note might be different from the original. Will need a follow up CT Pt aware   Peripheral vascular disease (HCC) 04/24/2016   Formatting of this note might be different  from the original. 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   Skin cancer of nose 07/23/2019   Vitamin B12 deficiency 07/23/2019    Past Surgical History:  Procedure Laterality Date   COLONOSCOPY W/ POLYPECTOMY     TYMPAMOPLASTY Left     Social History   Socioeconomic History   Marital status: Married    Spouse name: BECKY   Number of children: 4   Years of education: 11   Highest education level: Not on file  Occupational History   Occupation: RETIRED [employer]  Tobacco Use   Smoking status: Former    Current packs/day: 0.00    Average packs/day: 0.8 packs/day for 45.0 years (33.8 ttl pk-yrs)    Types: Cigarettes    Start date: 11/20/1957    Quit date: 11/20/2002    Years since quitting: 20.5   Smokeless tobacco: Never  Vaping Use   Vaping status: Never Used  Substance and Sexual Activity   Alcohol use: No   Drug use: No   Sexual activity: Not Currently  Other Topics Concern   Not on file  Social History Narrative   ** Merged History Encounter **       Social Determinants of Health   Financial Resource Strain: Not on file  Food Insecurity: Food Insecurity Present (05/09/2023)   Hunger Vital Sign    Worried About Running Out of Food in the Last Year: Sometimes true    Ran Out of Food in the Last Year: Sometimes true  Transportation Needs: No Transportation Needs (05/09/2023)   PRAPARE - Administrator, Civil Service (Medical): No    Lack of Transportation (Non-Medical): No  Physical Activity: Not on file  Stress: Not on file  Social Connections: Unknown (05/15/2023)   Received from [facility]   Social Network    Social Network: Not on file  Intimate Partner Violence: Unknown (05/15/2023)   Received from [facility]   HITS    Physically Hurt: Not on file    Insult or Talk Down To:  Not on file    Threaten Physical Harm: Not on file    Scream or Curse: Not on file    Family History  Problem Relation Age of Onset   Heart disease Mother    Stroke Mother    Hypertension Mother    Heart attack Mother    Hearing loss Mother    Colon cancer Father    Stomach cancer Sister    Lung cancer Sister      Current Outpatient Medications:    albuterol (ACCUNEB) 0.63 MG/3ML nebulizer solution, Inhale 3 mLs into the lungs every 6 (six) hours as needed for wheezing., Disp: , Rfl:    albuterol (VENTOLIN HFA) 108 (90 BASE) MCG/ACT inhaler, Inhale 2 puffs into the lungs every 4 (four) hours as needed for wheezing or shortness of breath. , Disp: , Rfl:    aspirin EC 81 MG tablet, Take 1 tablet (81 mg total)  by mouth daily., Disp: 90 tablet, Rfl: 3   atorvastatin (LIPITOR) 20 MG tablet, Take 20 mg by mouth daily., Disp: , Rfl:    budesonide-formoterol (SYMBICORT) 160-4.5 MCG/ACT inhaler, Inhale 2 puffs into the lungs 2 (two) times daily., Disp: , Rfl:    cholecalciferol (VITAMIN D) 1000 UNITS tablet, Take 1,000 Units by mouth daily., Disp: , Rfl:    Cyanocobalamin (B-12) 2500 MCG TABS, Take 1 tablet by mouth every morning., Disp: , Rfl:    ferrous sulfate 325 (65 FE) MG EC tablet, Take 325 mg by mouth daily with breakfast., Disp: , Rfl:    metoprolol tartrate (LOPRESSOR) 50 MG tablet, Take 2 tablets (100 mg total) by mouth once for 1 dose. IF HR is 55 or greater, take 2 tablets 2 hours prior to procedure, Disp: 2 tablet, Rfl: 0   montelukast (SINGULAIR) 10 MG tablet, Take 10 mg by mouth daily., Disp: , Rfl:    nitroGLYCERIN (NITROSTAT) 0.4 MG SL tablet, Place 1 tablet (0.4 mg total) under the tongue every 5 (five) minutes as needed. Take as needed for chest pain, Disp: 25 tablet, Rfl: 3   omeprazole (PRILOSEC) 40 MG capsule, Take 40 mg by mouth daily., Disp: , Rfl:    triamcinolone ointment (KENALOG) 0.1 %, Apply 1 application topically as needed., Disp: , Rfl:      Latest Ref Rng &  Units 06/05/2023    1:54 PM  CMP  Glucose 70 - 99 mg/dL 161   BUN 8 - 23 mg/dL 19   Creatinine 0.96 - 1.24 mg/dL 0.45   Sodium 409 - 811 mmol/L 137   Potassium 3.5 - 5.1 mmol/L 4.4   Chloride 98 - 111 mmol/L 104   CO2 22 - 32 mmol/L 22   Calcium 8.9 - 10.3 mg/dL 9.1   Total Protein 6.5 - 8.1 g/dL 6.6   Total Bilirubin 0.3 - 1.2 mg/dL 0.8   Alkaline Phos 38 - 126 U/L 96   AST 15 - 41 U/L 31   ALT 0 - 44 U/L 20       Latest Ref Rng & Units 06/06/2023   12:00 AM  CBC  WBC  9.6      Hemoglobin 13.5 - 17.5 13.6      Hematocrit 41 - 53 41      Platelets 150 - 400 K/uL 221         This result is from an external source.    No images are attached to the encounter.  No results found.   Assessment and plan-  The patient is a 77 y.o. male who presents to [facility] for initial meeting in preparation for starting chemotherapy for the treatment of  1. Small cell lung cancer, left upper lobe (HCC)   .   Chemo Care Clinic/High Risk for ER/Hospitalization during chemotherapy- We discussed the role of the chemo care clinic and identified patient specific risk factors. 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 BMI 29.19   COPD (chronic obstructive pulmonary disease) (HCC) 07/23/2019   Coronary artery disease involving native coronary artery of native heart  without angina pectoris 04/22/2015   Formatting of this note might be different from the original. Calcification noted on CT of his chest in 2016 Formatting of this note might be different from the original. 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 Bernadette Hoit in past.  Will see again.   Malaise and fatigue 04/24/2016   Mass of lower lobe of left lung 09/10/2019   Formatting of this note might be different from the original. Will need a follow up CT Pt aware   Peripheral vascular disease (HCC) 04/24/2016   Formatting of this note might be different from the original. 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  Date of education: 06/07/2023 2.  Physician name: Dr. Rennis Harding 3.  Diagnosis: Most cell lung cancer 4.  Stage: IIIA 5.  Cure  6.  Chemotherapy plan including drugs and how often: Cisplatin day 1/etoposide day 1-3 every 21 days 7.  Start date: 06/18/2023 8.  Other  referrals: None at this time 9.  The patient is to call our office with any questions or concerns.  Our office number [phone], if after hours or on the weekend, call the same number and wait for the answering service.  There is always an oncologist on call. 10.  Medications prescribed: ondansetron, prochlorperazine 11.  The patient has verbalized understanding of the treatment plan and has no barriers to adherence or understanding.   Obtained signed consent from patient.   Discussed symptoms including:  1.  Low blood counts including white blood cells red blood cells, and platelets.  If experience increased fatigue or abnormal bruising or bleeding, call our office. 2.  Infection including to avoid large crowds, wash hands frequently, and stay away from people who were sick.  If fever develops of 100.4 or higher, call our office. 3.  Mucositis:  Instructions on mouth rinse given (baking soda and salt mixture).  Keep mouth clean.  Use soft bristle toothbrush.  Avoid alcohol containing mouthwash.  If mouth sores develop, call our office. 4.  Nausea/vomiting:  Prescriptions given: ondansetron 8 mg every 8 hours as needed for nausea or vomiting and prochlorperazine 10 mg every 6 hours as needed for nausea or vomiting,  may alternate these medications and take around the clock if persistent.  If nausea and vomiting is not controlled, call our office 5.  Diarrhea: Use over-the-counter Imodium.  Call our office if diarrhea is not controlled. 6.  Constipation: Use senna-S, 1 to 2 tablets twice a day.  Call our office if no BM in 2 to 3 days. 7.  Loss of appetite:  Try to eat small meals every 2-3 hours.  Call our office if not able to eat or drink. 8.  Taste changes:  Try zinc 50 mg daily.  If becomes severe call clinic. 9.  Drink 2 to 3 quarts of water per day. Call our office if not able to drink enough for urine to be pale yellow. 10. Avoid alcoholic beverages. 11. Peripheral neuropathy: Call office  if numbness or tingling in hands or feet worsens or is suddenly severe. 12.  Ringing in the ears or hearing loss.  Call our office if this develops.    Due to risk of neutropenia, peg-filgrastim (Neulasta) will be given 24 to 48 hours after chemotherapy.  Gave information sheet on bone and joint pain.  Use Claritin or Pepcid.  May use ibuprofen or Aleve.  Call if symptoms persist or are unbearable.   The patient was given written information printed from [employer] patient education on individual chemotherapy agents which includes: Name of medications Approved uses Dose and schedule Storage and handling Handling body fluids and waste Drug and food interactions Possible side effects and management Pregnancy, sexual activity, and contraception Obtaining medication   Gave information on the supportive care team and how to contact them regarding services.  Discussed advanced directives.  The patient does not have their advanced directives.   We discussed that social determinants of health may have significant impacts on health and outcomes for cancer patients.  Today we discussed specific social determinants of performance status, alcohol use, depression, financial needs, food insecurity, housing, interpersonal violence, social connections, stress, tobacco use, and transportation.    After lengthy discussion the following were identified as areas of need:   Outpatient services: We discussed options including home based and outpatient services, DME, nutrition counseling, and supportive care program. We discussed that patients who participate in regular physical activity report fewer negative impacts of cancer and treatments and report less fatigue.   Financial Concerns: We discussed that living with cancer can create tremendous financial burden.  We discussed options for assistance. I asked that if assistance is needed in affording medications or paying bills to please let us know so that we can  provide assistance. We discussed options for food including social services.  Referral to Social work: Introduced Child psychotherapist Mady Haagensen and the services she can provide, such as support with utility bill, cell phone and gas vouchers.    Support groups: We discussed options for support groups at [facility]. We discussed options for managing stress including healthy eating and exercise, as well as participating in no charge counseling services at the cancer center and support groups.  If these are of interest, patient can notify either myself or primary nursing team.We discussed options for management including medications.  Transportation: We discussed options for transportation.  The patient will contact our office if he requires assistance with transportation.  Palliative care services: We have palliative care services available in the cancer center to discuss goals of care and advanced care planning.  Please let us know if you have any questions or would like to speak to our  palliative care practitioner.  Symptom Management Clinic: We discussed our symptom management clinic which is available for acute concerns while receiving treatment such as nausea, vomiting or diarrhea.  We can be reached via telephone at [phone].  We are available for virtual or in person visits on the same day from 9 to 4 PM Monday through Friday.   He denies needing specific assistance at this time.  He will be followed by Dr. Theadore Nan clinical team.   Disposition: RTC on 07/05/2023  Visit Diagnosis 1. Small cell lung cancer, left upper lobe (HCC)     I discussed the assessment and treatment plan with the patient.  The patient was provided an opportunity to ask questions and all were answered. The patient expressed understanding and was in agreement with this plan. He also understands that he can call clinic at any time with any questions, concerns, or complaints.   I provided *** minutes of {Blank  single:19197::"face-to-face time","face-to-face video visit time","non face-to-face telephone visit time"} during this encounter, and > 50% was spent counseling as documented under my assessment & plan.    A. Sol Passer, PA-C [facility] [phone]

## 2023-06-07 ENCOUNTER — Inpatient Hospital Stay: Payer: Medicare HMO | Admitting: Hematology and Oncology

## 2023-06-07 ENCOUNTER — Other Ambulatory Visit: Payer: Self-pay

## 2023-06-07 ENCOUNTER — Telehealth: Payer: Self-pay | Admitting: *Deleted

## 2023-06-07 DIAGNOSIS — C3412 Malignant neoplasm of upper lobe, left bronchus or lung: Secondary | ICD-10-CM

## 2023-06-07 NOTE — Telephone Encounter (Signed)
Patient's daughter is following up requesting to speak with pre-op again.

## 2023-06-07 NOTE — Telephone Encounter (Signed)
S/w Darl Pikes at [facility] to get date for upcoming procedure/the procedure is 06/08/2023.

## 2023-06-07 NOTE — Telephone Encounter (Signed)
   Name: Manuel Sanchez  DOB: 1946-02-18  MRN: [id number]  Primary Cardiologist: Garwin Brothers, MD  Chart reviewed as part of pre-operative protocol coverage. Because of Manuel Sanchez's past medical history and time since last visit, he will require a follow-up in-office visit in order to better assess preoperative cardiovascular risk.  Pt has not been seen since 2021.  Pre-op covering staff: - Please schedule appointment and call patient to inform them. If patient already had an upcoming appointment within acceptable timeframe, please add "pre-op clearance" to the appointment notes so provider is aware. - Please contact requesting surgeon's office via preferred method (i.e, phone, fax) to inform them of need for appointment prior to surgery.    Joylene Grapes, NP  06/07/2023, 11:17 AM

## 2023-06-07 NOTE — Telephone Encounter (Signed)
Contacted the patient and spoke with the patients wife. I explained to her that I was calling from the patients heart doctor. She states she donesnt understand why I called her. After explaining to her cardiac clearance is needed to proceed with the patients procedure she then asked when the soonest appointment. I offered Monday with Wallis Bamberg, NP. Patients wife started yelling saying she can not bring the patient on Monday because she is having knee surgery. Then she tells me to call her daughter because she handles the appointments.   Contacted the patients daughter and she told me she would call me back once she finds the patient a ride.   Advised her that the phone would be turning off soon, so if she doesn't reach anyone this afternoon to call back in the morning. She voiced understand and states she will call right back.

## 2023-06-07 NOTE — Telephone Encounter (Signed)
   Pre-operative Risk Assessment    Patient Name: Manuel Sanchez  DOB: 16-Jun-1946 MRN: [id number]      Request for Surgical Clearance    Procedure:   Insertion of port a cath  Date of Surgery:  Clearance 06/08/23                                 Surgeon:  Dr. Derenda Fennel Surgeon's Group or Practice Name:  [facility] Phone number:  [phone] Fax number:  [phone]   Type of Clearance Requested:   - Medical  - Pharmacy:  Hold Aspirin Not Indicated   Type of Anesthesia:  General    Additional requests/questions:    Signed, Emmit Pomfret   06/07/2023, 8:14 AM

## 2023-06-08 ENCOUNTER — Inpatient Hospital Stay (INDEPENDENT_AMBULATORY_CARE_PROVIDER_SITE_OTHER): Payer: Medicare HMO | Admitting: Hematology and Oncology

## 2023-06-08 ENCOUNTER — Other Ambulatory Visit: Payer: Self-pay | Admitting: Hematology and Oncology

## 2023-06-08 ENCOUNTER — Encounter: Payer: Self-pay | Admitting: Hematology and Oncology

## 2023-06-08 DIAGNOSIS — C3412 Malignant neoplasm of upper lobe, left bronchus or lung: Secondary | ICD-10-CM

## 2023-06-08 MED ORDER — ONDANSETRON HCL 8 MG PO TABS
8.0000 mg | ORAL_TABLET | Freq: Three times a day (TID) | ORAL | 1 refills | Status: DC | PRN
Start: 2023-06-08 — End: 2023-10-02

## 2023-06-08 MED ORDER — PROCHLORPERAZINE MALEATE 10 MG PO TABS
10.0000 mg | ORAL_TABLET | Freq: Four times a day (QID) | ORAL | 1 refills | Status: DC | PRN
Start: 2023-06-08 — End: 2023-10-02

## 2023-06-08 NOTE — Telephone Encounter (Signed)
I called and left message for the pt's daughter Camelia Eng Grooms the pt will require an appt in office as he was last seen 07/2020. I left message to call back and schedule an appt with Dr. Tomie China or APP. I will also send a message to [facility] sched team to see if they can get the pt in. I will also send FYI notes to Dr. Abran Duke office pt is going to need an appt as last seen 2021.

## 2023-06-08 NOTE — Telephone Encounter (Signed)
Before calling the pt's daughter back I have reviewed the chart. What I can tell it seems the procedure for the pt is today 06/08/23. Request came to [place] yesterday 06/07/23. I tried to call the requesting office but they are closed. Looks like last time we saw the pt in the office was 07/2020.   Will review with pre op APP for further recommendations. Pt has lung cancer and is needing to start chemo.

## 2023-06-08 NOTE — Progress Notes (Cosign Needed)
[facility] CARE CLINIC CONSULT NOTE [facility] [place] [phone]   [phone]   Patient Care Team: Gordan Payment., MD as PCP - General (Internal Medicine) Revankar, Aundra Dubin, MD as PCP - Cardiology (Cardiology) Gordan Payment., MD (Internal Medicine) Marcellus Scott, MD as Referring Physician (Specialist)   Name of the patient: Manuel Sanchez  [id number]  [birth year]   Date of visit: 06/08/23  Diagnosis: Small cell lung cancer  Chief complaint/Reason for visit- Initial Meeting for [facility], preparing for starting chemotherapy  Heme/Onc history:  Oncology History  Small cell lung cancer, left upper lobe (HCC)  06/05/2023 Initial Diagnosis   Small cell lung cancer, left upper lobe (HCC)   06/18/2023 -  Chemotherapy   Patient is on Treatment Plan : LUNG SMALL CELL Cisplatin (80) D1 + Etoposide (100) D1-3 q21d       Interval history-  The patient presents to chemo care clinic today for initial meeting in preparation for starting chemotherapy. I introduced the chemo care clinic and we discussed that the role of the clinic is to assist those who are at an increased risk of emergency room visits and/or complications during the course of chemotherapy treatment. We discussed that the increased risk takes into account factors such as age, performance status, and co-morbidities. We also discussed that for some, this might include barriers to care such as not having a primary care provider, lack of insurance/transportation, or not being able to afford medications. We discussed that the goal of the program is to help prevent unplanned ER visits and help reduce complications during chemotherapy. We do this by discussing specific risk factors to each individual and identifying ways that we can help improve these risk factors and reduce barriers to care.   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 BMI 29.19   COPD (chronic obstructive pulmonary disease) (HCC) 07/23/2019   Coronary artery disease involving native coronary artery of native heart without angina pectoris 04/22/2015   Formatting of this note might be different from the original. Calcification noted on CT of his chest in 2016 Formatting of this note might be different from the original. 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 Bernadette Hoit in past.  Will see again.   Malaise and fatigue 04/24/2016   Mass of lower lobe of left lung 09/10/2019   Formatting of this note might be different from the original. Will need a follow up CT Pt aware   Peripheral vascular disease (HCC)  04/24/2016   Formatting of this note might be different from the original. 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   Skin cancer of nose 07/23/2019   Vitamin B12 deficiency 07/23/2019    Past Surgical History:  Procedure Laterality Date   COLONOSCOPY W/ POLYPECTOMY     TYMPAMOPLASTY Left     Social History   Socioeconomic History   Marital status: Married    Spouse name: BECKY   Number of children: 4   Years of education: 11   Highest education level: Not on file  Occupational History   Occupation: RETIRED [employer]  Tobacco Use   Smoking status: Former    Current packs/day: 0.00    Average packs/day: 0.8 packs/day for 45.0 years (33.8 ttl pk-yrs)    Types: Cigarettes    Start date: 11/20/1957    Quit date: 11/20/2002    Years since quitting: 20.5   Smokeless tobacco: Never  Vaping Use   Vaping status: Never Used  Substance and Sexual Activity   Alcohol use: No   Drug use: No   Sexual activity: Not Currently  Other Topics Concern   Not on file  Social History Narrative   ** Merged History Encounter **       Social Determinants of Health   Financial Resource Strain: Not on file  Food Insecurity: Food Insecurity Present (05/09/2023)   Hunger Vital Sign    Worried About Running Out of Food in the Last Year: Sometimes true    Ran Out of Food in the Last Year: Sometimes true  Transportation Needs: No Transportation Needs (05/09/2023)   PRAPARE - Administrator, Civil Service (Medical): No    Lack of Transportation (Non-Medical): No  Physical Activity: Not on file  Stress: Not on file  Social Connections: Unknown (05/15/2023)   Received from [facility]   Social Network    Social Network: Not on file  Intimate Partner Violence: Unknown (05/15/2023)   Received from [facility], [facility]   HITS     Physically Hurt: Not on file    Insult or Talk Down To: Not on file    Threaten Physical Harm: Not on file    Scream or Curse: Not on file    Family History  Problem Relation Age of Onset   Heart disease Mother    Stroke Mother    Hypertension Mother    Heart attack Mother    Hearing loss Mother    Colon cancer Father    Stomach cancer Sister    Lung cancer Sister      Current Outpatient Medications:    atorvastatin (LIPITOR) 40 MG tablet, Take 40 mg by mouth daily., Disp: , Rfl:    budesonide-formoterol (SYMBICORT) 160-4.5 MCG/ACT inhaler, Inhale 2 puffs into the lungs at bedtime., Disp: , Rfl:    Calcium Carb-Cholecalciferol (OYSTER SHELL CALCIUM W/D) 500-5 MG-MCG TABS, Take 1 tablet by mouth daily., Disp: , Rfl:    fluticasone (FLONASE) 50  MCG/ACT nasal spray, Place 2 sprays into both nostrils daily., Disp: , Rfl:    montelukast (SINGULAIR) 10 MG tablet, Take 10 mg by mouth at bedtime., Disp: , Rfl:    nitroGLYCERIN (NITROSTAT) 0.4 MG SL tablet, Place 0.4 mg under the tongue every 5 (five) minutes as needed., Disp: , Rfl:    albuterol (ACCUNEB) 0.63 MG/3ML nebulizer solution, Inhale 3 mLs into the lungs every 6 (six) hours as needed for wheezing., Disp: , Rfl:    albuterol (VENTOLIN HFA) 108 (90 BASE) MCG/ACT inhaler, Inhale 2 puffs into the lungs every 4 (four) hours as needed for wheezing or shortness of breath. , Disp: , Rfl:    aspirin EC 81 MG tablet, Take 81 mg by mouth daily. (Patient not taking: Reported on 06/08/2023), Disp: , Rfl:    cholecalciferol (VITAMIN D) 1000 UNITS tablet, Take 1,000 Units by mouth daily., Disp: , Rfl:    co-enzyme Q-10 30 MG capsule, Take 30 mg by mouth daily., Disp: , Rfl:    Cyanocobalamin (B-12) 2500 MCG TABS, Take 1 tablet by mouth every morning., Disp: , Rfl:    ferrous sulfate 325 (65 FE) MG EC tablet, Take 325 mg by mouth daily with breakfast., Disp: , Rfl:    ipratropium-albuterol (DUONEB) 0.5-2.5 (3) MG/3ML SOLN, Take 3 mLs by  nebulization every 6 (six) hours., Disp: , Rfl:    pantoprazole (PROTONIX) 40 MG tablet, Take 40 mg by mouth daily., Disp: , Rfl:    triamcinolone ointment (KENALOG) 0.1 %, Apply 1 application topically as needed., Disp: , Rfl:    zinc gluconate 3.75 mg/mL SOLN, Take 1 tablet by mouth every morning., Disp: , Rfl:      Latest Ref Rng & Units 06/05/2023    1:54 PM  CMP  Glucose 70 - 99 mg/dL 409   BUN 8 - 23 mg/dL 19   Creatinine 8.11 - 1.24 mg/dL 9.14   Sodium 782 - 956 mmol/L 137   Potassium 3.5 - 5.1 mmol/L 4.4   Chloride 98 - 111 mmol/L 104   CO2 22 - 32 mmol/L 22   Calcium 8.9 - 10.3 mg/dL 9.1   Total Protein 6.5 - 8.1 g/dL 6.6   Total Bilirubin 0.3 - 1.2 mg/dL 0.8   Alkaline Phos 38 - 126 U/L 96   AST 15 - 41 U/L 31   ALT 0 - 44 U/L 20       Latest Ref Rng & Units 06/06/2023   12:00 AM  CBC  WBC  9.6      Hemoglobin 13.5 - 17.5 13.6      Hematocrit 41 - 53 41      Platelets 150 - 400 K/uL 221         This result is from an external source.    No images are attached to the encounter.  No results found.   Assessment and plan-  The patient is a 77 y.o. male who presents to [facility] for initial meeting in preparation for starting chemotherapy for the treatment of  1. Small cell lung cancer, left upper lobe (HCC)    .   Chemo Care Clinic/High Risk for ER/Hospitalization during chemotherapy- We discussed the role of the chemo care clinic and identified patient specific risk factors. 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 BMI 29.19   COPD (chronic obstructive pulmonary disease) (HCC) 07/23/2019   Coronary artery disease involving native coronary artery of native heart without angina pectoris 04/22/2015   Formatting of this note might be different from the original. Calcification noted on CT of his chest in 2016 Formatting of this note might be different from the original. 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 Bernadette Hoit in past.  Will see again.   Malaise and fatigue 04/24/2016   Mass of lower lobe of left lung 09/10/2019   Formatting of this note might be different from the original. Will need a follow up CT Pt aware   Peripheral vascular disease (HCC) 04/24/2016   Formatting of this note might be different from the original. 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  Date of education: 06/07/2023 2.  Physician name: Dr. Rennis Harding 3.  Diagnosis: Most cell lung cancer 4.  Stage: IIIA 5.  Cure  6.  Chemotherapy plan including drugs and how often: Cisplatin day 1/etoposide day 1-3 every 21 days 7.  Start date: 06/18/2023 8.  Other referrals: None at this time 9.  The patient is to call our office with any questions or concerns.  Our office number [phone], if after hours or on the weekend, call the same number and wait for the answering service.  There is always an oncologist on call. 10.  Medications prescribed: ondansetron, prochlorperazine 11.  The patient has verbalized understanding of the treatment plan and has no barriers to adherence or understanding.   Obtained signed consent from patient.   Discussed symptoms including:  1.  Low blood counts including white blood cells red blood cells, and platelets.  If experience increased fatigue or abnormal bruising or bleeding, call our office. 2.  Infection including to avoid large crowds, wash hands frequently, and stay away from people who were sick.  If fever develops of 100.4 or higher, call our office. 3.  Mucositis:  Instructions on mouth rinse given (  baking soda and salt mixture).  Keep mouth clean.  Use soft bristle toothbrush.  Avoid alcohol containing mouthwash.  If mouth sores develop, call our office. 4.  Nausea/vomiting:  Prescriptions given: ondansetron 8 mg every 8 hours as needed for nausea or vomiting and prochlorperazine 10 mg every 6 hours as needed for nausea or vomiting, may alternate these medications and take around the clock if persistent.  If nausea and vomiting is not controlled, call our office 5.  Diarrhea: Use over-the-counter Imodium.  Call our office if diarrhea is not controlled. 6.  Constipation: Use senna-S, 1 to 2 tablets twice a day.  Call our office if no BM in 2 to 3 days. 7.  Loss of appetite:  Try to  eat small meals every 2-3 hours.  Call our office if not able to eat or drink. 8.  Taste changes:  Try zinc 50 mg daily.  If becomes severe call clinic. 9.  Drink 2 to 3 quarts of water per day. Call our office if not able to drink enough for urine to be pale yellow. 10. Avoid alcoholic beverages. 11. Peripheral neuropathy: Call office if numbness or tingling in hands or feet worsens or is suddenly severe. 12.  Ringing in the ears or hearing loss.  Call our office if this develops.    Due to risk of neutropenia, peg-filgrastim (Neulasta) will be given 24 to 48 hours after chemotherapy.  Gave information sheet on bone and joint pain.  Use Claritin or Pepcid.  May use ibuprofen or Aleve.  Call if symptoms persist or are unbearable.   The patient was given written information printed from [employer] patient education on individual chemotherapy agents which includes: Name of medications Approved uses Dose and schedule Storage and handling Handling body fluids and waste Drug and food interactions Possible side effects and management Pregnancy, sexual activity, and contraception Obtaining medication   Gave information on the supportive care team and how to contact them regarding services.  Discussed advanced directives.  The patient does not have their advanced directives.   We discussed that social determinants of health may have significant impacts on health and outcomes for cancer patients.  Today we discussed specific social determinants of performance status, alcohol use, depression, financial needs, food insecurity, housing, interpersonal violence, social connections, stress, tobacco use, and transportation.    After lengthy discussion the following were identified as areas of need:   Outpatient services: We discussed options including home based and outpatient services, DME, nutrition counseling, and supportive care program. We discussed that patients who participate in regular physical  activity report fewer negative impacts of cancer and treatments and report less fatigue.   Financial Concerns: We discussed that living with cancer can create tremendous financial burden.  We discussed options for assistance. I asked that if assistance is needed in affording medications or paying bills to please let us know so that we can provide assistance. We discussed options for food including social services.  Referral to Social work: Introduced Child psychotherapist Mady Haagensen and the services she can provide, such as support with utility bill, cell phone and gas vouchers.    Support groups: We discussed options for support groups at [facility]. We discussed options for managing stress including healthy eating and exercise, as well as participating in no charge counseling services at the cancer center and support groups.  If these are of interest, patient can notify either myself or primary nursing team.We discussed options for management including medications.  Transportation:  We discussed options for transportation.  The patient will contact our office if he requires assistance with transportation.  Palliative care services: We have palliative care services available in the cancer center to discuss goals of care and advanced care planning.  Please let us know if you have any questions or would like to speak to our palliative care practitioner.  Symptom Management Clinic: We discussed our symptom management clinic which is available for acute concerns while receiving treatment such as nausea, vomiting or diarrhea.  We can be reached via telephone at [phone].  We are available for virtual or in person visits on the same day from 9 to 4 PM Monday through Friday.   He denies needing specific assistance at this time.  He will be followed by Dr. Theadore Nan clinical team.   Disposition: RTC on 07/05/2023  Visit Diagnosis 1. Small cell lung cancer, left upper lobe (HCC)      I discussed the  assessment and treatment plan with the patient, his wife and daughter.  The patient was provided an opportunity to ask questions and all were answered. The patient expressed understanding and was in agreement with this plan. He also understands that he can call clinic at any time with any questions, concerns, or complaints.   I provided 30 minutes of face-to-face time during this encounter, and > 50% was spent counseling as documented under my assessment & plan.   Charlen Bakula A. Sol Passer, PA-C [facility] [phone]

## 2023-06-08 NOTE — Telephone Encounter (Signed)
Pt has appt 06/12/23 with Wallis Bamberg, NP for pre op clearance. I will update all parties involved.

## 2023-06-11 ENCOUNTER — Encounter: Payer: Self-pay | Admitting: Oncology

## 2023-06-11 NOTE — Progress Notes (Deleted)
  Cardiology Office Note:  .   Date:  06/11/2023  ID:  Manuel Sanchez, DOB 09-19-1946, MRN [id number] PCP: Gordan Payment., MD  [facility] Providers Cardiologist:  Garwin Brothers, MD { Click to update primary MD,subspecialty MD or APP then REFRESH:1}   History of Present Illness: .   Manuel Sanchez is a 77 y.o. male with a past medical history of PVD, CAD, HTN, COPD, SCLC, GERD, HLD.   09/11/2019 Lexiscan small defect of mild severity present in the mid inferior location that was reversible and consistent with ischemia 10/20/2019 coronary CTA calcium score of 724 placing him in the 77th percentile, FFR was borderline at the distal LAD   Most recently evaluated by Dr. Tomie China in September 2021, he was doing well from a cardiac perspective, he was advised to return in 6 months and it appears he was lost to follow-up.  Insertion of Port-A-Cath, date TBD   ROS: ***  Studies Reviewed: .        *** Risk Assessment/Calculations:   {Does this patient have ATRIAL FIBRILLATION?:[phone]} No BP recorded.  {Refresh Note OR Click here to enter BP  :1}***       Physical Exam:   VS:  There were no vitals taken for this visit.   Wt Readings from Last 3 Encounters:  06/08/23 197 lb 8 oz (89.6 kg)  06/05/23 198 lb 3.2 oz (89.9 kg)  05/23/23 196 lb 14.4 oz (89.3 kg)    GEN: Well nourished, well developed in no acute distress NECK: No JVD; No carotid bruits CARDIAC: ***RRR, no murmurs, rubs, gallops RESPIRATORY:  Clear to auscultation without rales, wheezing or rhonchi  ABDOMEN: Soft, non-tender, non-distended EXTREMITIES:  No edema; No deformity   ASSESSMENT AND PLAN: .   ***    {Are you ordering a CV Procedure (e.g. stress test, cath, DCCV, TEE, etc)?   Press F2        :[id number]}  Dispo: ***  Signed, Flossie Dibble, NP

## 2023-06-12 ENCOUNTER — Ambulatory Visit: Payer: Medicare HMO | Attending: Cardiology | Admitting: Cardiology

## 2023-06-12 DIAGNOSIS — I739 Peripheral vascular disease, unspecified: Secondary | ICD-10-CM

## 2023-06-12 DIAGNOSIS — J439 Emphysema, unspecified: Secondary | ICD-10-CM

## 2023-06-12 DIAGNOSIS — I2510 Atherosclerotic heart disease of native coronary artery without angina pectoris: Secondary | ICD-10-CM

## 2023-06-12 DIAGNOSIS — I10 Essential (primary) hypertension: Secondary | ICD-10-CM

## 2023-06-14 ENCOUNTER — Inpatient Hospital Stay: Payer: Medicare HMO

## 2023-06-14 DIAGNOSIS — C3412 Malignant neoplasm of upper lobe, left bronchus or lung: Secondary | ICD-10-CM

## 2023-06-14 DIAGNOSIS — Z5111 Encounter for antineoplastic chemotherapy: Secondary | ICD-10-CM | POA: Diagnosis not present

## 2023-06-14 LAB — CBC WITH DIFFERENTIAL (CANCER CENTER ONLY)
Abs Immature Granulocytes: 0.03 10*3/uL (ref 0.00–0.07)
Basophils Absolute: 0.1 10*3/uL (ref 0.0–0.1)
Basophils Relative: 1 %
Eosinophils Absolute: 0.2 10*3/uL (ref 0.0–0.5)
Eosinophils Relative: 3 %
HCT: 39.3 % (ref 39.0–52.0)
Hemoglobin: 12.4 g/dL — ABNORMAL LOW (ref 13.0–17.0)
Immature Granulocytes: 1 %
Lymphocytes Relative: 29 %
Lymphs Abs: 1.9 10*3/uL (ref 0.7–4.0)
MCH: 29.3 pg (ref 26.0–34.0)
MCHC: 31.6 g/dL (ref 30.0–36.0)
MCV: 92.9 fL (ref 80.0–100.0)
Monocytes Absolute: 0.7 10*3/uL (ref 0.1–1.0)
Monocytes Relative: 10 %
Neutro Abs: 3.8 10*3/uL (ref 1.7–7.7)
Neutrophils Relative %: 56 %
Platelet Count: 186 10*3/uL (ref 150–400)
RBC: 4.23 MIL/uL (ref 4.22–5.81)
RDW: 14.6 % (ref 11.5–15.5)
WBC Count: 6.7 10*3/uL (ref 4.0–10.5)
nRBC: 0 % (ref 0.0–0.2)

## 2023-06-14 LAB — CMP (CANCER CENTER ONLY)
ALT: 12 U/L (ref 0–44)
AST: 18 U/L (ref 15–41)
Albumin: 3.5 g/dL (ref 3.5–5.0)
Alkaline Phosphatase: 98 U/L (ref 38–126)
Anion gap: 11 (ref 5–15)
BUN: 14 mg/dL (ref 8–23)
CO2: 26 mmol/L (ref 22–32)
Calcium: 9.1 mg/dL (ref 8.9–10.3)
Chloride: 103 mmol/L (ref 98–111)
Creatinine: 1.03 mg/dL (ref 0.61–1.24)
GFR, Estimated: >60 mL/min (ref >60)
Glucose, Bld: 92 mg/dL (ref 70–99)
Potassium: 3.6 mmol/L (ref 3.5–5.1)
Sodium: 140 mmol/L (ref 135–145)
Total Bilirubin: 0.5 mg/dL (ref 0.3–1.2)
Total Protein: 6.7 g/dL (ref 6.5–8.1)

## 2023-06-14 LAB — MAGNESIUM: Magnesium: 2.2 mg/dL (ref 1.7–2.4)

## 2023-06-15 ENCOUNTER — Encounter: Payer: Self-pay | Admitting: Oncology

## 2023-06-15 MED FILL — Fosaprepitant Dimeglumine For IV Infusion 150 MG (Base Eq): INTRAVENOUS | Qty: 5 | Status: AC

## 2023-06-15 MED FILL — Dexamethasone Sodium Phosphate Inj 100 MG/10ML: INTRAMUSCULAR | Qty: 1 | Status: AC

## 2023-06-15 MED FILL — Cisplatin Inj 100 MG/100ML (1 MG/ML): INTRAVENOUS | Qty: 150 | Status: AC

## 2023-06-18 ENCOUNTER — Inpatient Hospital Stay: Payer: Medicare HMO

## 2023-06-18 VITALS — BP 143/69 | HR 76 | Temp 98.0°F | Resp 18 | Ht 66.0 in | Wt 197.1 lb

## 2023-06-18 DIAGNOSIS — C3412 Malignant neoplasm of upper lobe, left bronchus or lung: Secondary | ICD-10-CM

## 2023-06-18 DIAGNOSIS — Z5111 Encounter for antineoplastic chemotherapy: Secondary | ICD-10-CM | POA: Diagnosis not present

## 2023-06-18 MED ORDER — PALONOSETRON HCL INJECTION 0.25 MG/5ML
0.2500 mg | Freq: Once | INTRAVENOUS | Status: AC
  Administered 2023-06-18: 0.25 mg via INTRAVENOUS
  Filled 2023-06-18: qty 5

## 2023-06-18 MED ORDER — SODIUM CHLORIDE 0.9 % IV SOLN
150.0000 mg | Freq: Once | INTRAVENOUS | Status: AC
  Administered 2023-06-18: 150 mg via INTRAVENOUS
  Filled 2023-06-18: qty 150

## 2023-06-18 MED ORDER — SODIUM CHLORIDE 0.9 % IV SOLN
100.0000 mg/m2 | Freq: Once | INTRAVENOUS | Status: AC
  Administered 2023-06-18: 200 mg via INTRAVENOUS
  Filled 2023-06-18: qty 10

## 2023-06-18 MED ORDER — SODIUM CHLORIDE 0.9% FLUSH
10.0000 mL | INTRAVENOUS | Status: DC | PRN
  Administered 2023-06-18: 10 mL

## 2023-06-18 MED ORDER — SODIUM CHLORIDE 0.9 % IV SOLN
80.0000 mg/m2 | Freq: Once | INTRAVENOUS | Status: AC
  Administered 2023-06-18: 150 mg via INTRAVENOUS
  Filled 2023-06-18: qty 150

## 2023-06-18 MED ORDER — HEPARIN SOD (PORK) LOCK FLUSH 100 UNIT/ML IV SOLN
500.0000 [IU] | Freq: Once | INTRAVENOUS | Status: AC | PRN
  Administered 2023-06-18: 500 [IU]

## 2023-06-18 MED ORDER — SODIUM CHLORIDE 0.9 % IV SOLN: Freq: Once | INTRAVENOUS | Status: AC

## 2023-06-18 MED ORDER — SODIUM CHLORIDE 0.9 % IV SOLN
10.0000 mg | Freq: Once | INTRAVENOUS | Status: AC
  Administered 2023-06-18: 10 mg via INTRAVENOUS
  Filled 2023-06-18: qty 10

## 2023-06-18 MED ORDER — POTASSIUM CHLORIDE IN NACL 20-0.9 MEQ/L-% IV SOLN
Freq: Once | INTRAVENOUS | Status: AC
  Filled 2023-06-18: qty 1000

## 2023-06-18 MED ORDER — MAGNESIUM SULFATE 2 GM/50ML IV SOLN
2.0000 g | Freq: Once | INTRAVENOUS | Status: AC
  Administered 2023-06-18: 2 g via INTRAVENOUS
  Filled 2023-06-18: qty 50

## 2023-06-18 MED FILL — Etoposide Inj 1 GM/50ML (20 MG/ML): INTRAVENOUS | Qty: 10 | Status: AC

## 2023-06-18 MED FILL — Dexamethasone Sodium Phosphate Inj 100 MG/10ML: INTRAMUSCULAR | Qty: 1 | Status: AC

## 2023-06-18 NOTE — Patient Instructions (Signed)
[facility]  Discharge Instructions: Thank you for choosing [facility] to provide your oncology and hematology care.  If you have a lab appointment with the Cancer Center, please go directly to the Cancer Center and check in at the registration area.   Wear comfortable clothing and clothing appropriate for easy access to any Portacath or PICC line.   We strive to give you quality time with your provider. You may need to reschedule your appointment if you arrive late (15 or more minutes).  Arriving late affects you and other patients whose appointments are after yours.  Also, if you miss three or more appointments without notifying the office, you may be dismissed from the clinic at the provider's discretion.      For prescription refill requests, have your pharmacy contact our office and allow 72 hours for refills to be completed.    Today you received the following chemotherapy and/or immunotherapy agents Cisplatin, Etoposide      To help prevent nausea and vomiting after your treatment, we encourage you to take your nausea medication as directed.  BELOW ARE SYMPTOMS THAT SHOULD BE REPORTED IMMEDIATELY: *FEVER GREATER THAN 100.4 F (38 C) OR HIGHER *CHILLS OR SWEATING *NAUSEA AND VOMITING THAT IS NOT CONTROLLED WITH YOUR NAUSEA MEDICATION *UNUSUAL SHORTNESS OF BREATH *UNUSUAL BRUISING OR BLEEDING *URINARY PROBLEMS (pain or burning when urinating, or frequent urination) *BOWEL PROBLEMS (unusual diarrhea, constipation, pain near the anus) TENDERNESS IN MOUTH AND THROAT WITH OR WITHOUT PRESENCE OF ULCERS (sore throat, sores in mouth, or a toothache) UNUSUAL RASH, SWELLING OR PAIN  UNUSUAL VAGINAL DISCHARGE OR ITCHING   Items with * indicate a potential emergency and should be followed up as soon as possible or go to the Emergency Department if any problems should occur.  Please show the CHEMOTHERAPY ALERT CARD or IMMUNOTHERAPY ALERT CARD at check-in  to the Emergency Department and triage nurse.  Should you have questions after your visit or need to cancel or reschedule your appointment, please contact [facility]  Dept: [phone]  and follow the prompts.  Office hours are 8:00 a.m. to 4:30 p.m. Monday - Friday. Please note that voicemails left after 4:00 p.m. may not be returned until the following business day.  We are closed weekends and major holidays. You have access to a nurse at all times for urgent questions. Please call the main number to the clinic Dept: [phone] and follow the prompts.  For any non-urgent questions, you may also contact your provider using MyChart. We now offer e-Visits for anyone 6 and older to request care online for non-urgent symptoms. For details visit [email or web address].   Also download the MyChart app! Go to the app store, search "MyChart", open the app, select [facility], and log in with your MyChart username and password.

## 2023-06-19 ENCOUNTER — Inpatient Hospital Stay: Payer: Medicare HMO

## 2023-06-19 VITALS — BP 129/67 | HR 63 | Temp 97.3°F | Resp 20 | Ht 66.0 in | Wt 200.0 lb

## 2023-06-19 DIAGNOSIS — C3412 Malignant neoplasm of upper lobe, left bronchus or lung: Secondary | ICD-10-CM

## 2023-06-19 DIAGNOSIS — Z5111 Encounter for antineoplastic chemotherapy: Secondary | ICD-10-CM | POA: Diagnosis not present

## 2023-06-19 MED ORDER — HEPARIN SOD (PORK) LOCK FLUSH 100 UNIT/ML IV SOLN
500.0000 [IU] | Freq: Once | INTRAVENOUS | Status: AC | PRN
  Administered 2023-06-19: 500 [IU]

## 2023-06-19 MED ORDER — SODIUM CHLORIDE 0.9 % IV SOLN: Freq: Once | INTRAVENOUS | Status: AC

## 2023-06-19 MED ORDER — SODIUM CHLORIDE 0.9 % IV SOLN
10.0000 mg | Freq: Once | INTRAVENOUS | Status: AC
  Administered 2023-06-19: 10 mg via INTRAVENOUS
  Filled 2023-06-19: qty 10

## 2023-06-19 MED ORDER — SODIUM CHLORIDE 0.9 % IV SOLN
100.0000 mg/m2 | Freq: Once | INTRAVENOUS | Status: AC
  Administered 2023-06-19: 200 mg via INTRAVENOUS
  Filled 2023-06-19: qty 10

## 2023-06-19 MED ORDER — SODIUM CHLORIDE 0.9% FLUSH
10.0000 mL | INTRAVENOUS | Status: DC | PRN
  Administered 2023-06-19: 10 mL

## 2023-06-19 MED FILL — Dexamethasone Sodium Phosphate Inj 100 MG/10ML: INTRAMUSCULAR | Qty: 1 | Status: AC

## 2023-06-19 MED FILL — Etoposide Inj 1 GM/50ML (20 MG/ML): INTRAVENOUS | Qty: 10 | Status: AC

## 2023-06-19 NOTE — Patient Instructions (Signed)
Etoposide Injection What is this medication? ETOPOSIDE (e toe POE side) treats some types of cancer. It works by slowing down the growth of cancer cells. This medicine may be used for other purposes; ask your health care provider or pharmacist if you have questions. COMMON BRAND NAME(S): Etopophos, Toposar, VePesid What should I tell my care team before I take this medication? They need to know if you have any of these conditions: Infection Kidney disease Liver disease Low blood counts, such as low white cell, platelet, red cell counts An unusual or allergic reaction to etoposide, other medications, foods, dyes, or preservatives If you or your partner are pregnant or trying to get pregnant Breastfeeding How should I use this medication? This medication is injected into a vein. It is given by your care team in a hospital or clinic setting. Talk to your care team about the use of this medication in children. Special care may be needed. Overdosage: If you think you have taken too much of this medicine contact a poison control center or emergency room at once. NOTE: This medicine is only for you. Do not share this medicine with others. What if I miss a dose? Keep appointments for follow-up doses. It is important not to miss your dose. Call your care team if you are unable to keep an appointment. What may interact with this medication? Warfarin This list may not describe all possible interactions. Give your health care provider a list of all the medicines, herbs, non-prescription drugs, or dietary supplements you use. Also tell them if you smoke, drink alcohol, or use illegal drugs. Some items may interact with your medicine. What should I watch for while using this medication? Your condition will be monitored carefully while you are receiving this medication. This medication may make you feel generally unwell. This is not uncommon as chemotherapy can affect healthy cells as well as cancer  cells. Report any side effects. Continue your course of treatment even though you feel ill unless your care team tells you to stop. This medication can cause serious side effects. To reduce the risk, your care team may give you other medications to take before receiving this one. Be sure to follow the directions from your care team. This medication may increase your risk of getting an infection. Call your care team for advice if you get a fever, chills, sore throat, or other symptoms of a cold or flu. Do not treat yourself. Try to avoid being around people who are sick. This medication may increase your risk to bruise or bleed. Call your care team if you notice any unusual bleeding. Talk to your care team about your risk of cancer. You may be more at risk for certain types of cancers if you take this medication. Talk to your care team if you may be pregnant. Serious birth defects can occur if you take this medication during pregnancy and for 6 months after the last dose. You will need a negative pregnancy test before starting this medication. Contraception is recommended while taking this medication and for 6 months after the last dose. Your care team can help you find the option that works for you. If your partner can get pregnant, use a condom during sex while taking this medication and for 4 months after the last dose. Do not breastfeed while taking this medication. This medication may cause infertility. Talk to your care team if you are concerned about your fertility. What side effects may I notice from receiving this medication?  Side effects that you should report to your care team as soon as possible: Allergic reactions--skin rash, itching, hives, swelling of the face, lips, tongue, or throat Infection--fever, chills, cough, sore throat, wounds that don't heal, pain or trouble when passing urine, general feeling of discomfort or being unwell Low red blood cell level--unusual weakness or fatigue,  dizziness, headache, trouble breathing Unusual bruising or bleeding Side effects that usually do not require medical attention (report to your care team if they continue or are bothersome): Diarrhea Fatigue Hair loss Loss of appetite Nausea Vomiting This list may not describe all possible side effects. Call your doctor for medical advice about side effects. You may report side effects to FDA at 1-800-FDA-1088. Where should I keep my medication? This medication is given in a hospital or clinic. It will not be stored at home. NOTE: This sheet is a summary. It may not cover all possible information. If you have questions about this medicine, talk to your doctor, pharmacist, or health care provider.  2024 Elsevier/Gold Standard (2022-03-30 00:00:00)

## 2023-06-20 ENCOUNTER — Inpatient Hospital Stay: Payer: Medicare HMO

## 2023-06-20 VITALS — BP 142/69 | HR 61 | Temp 97.9°F | Resp 20 | Ht 66.0 in | Wt 203.0 lb

## 2023-06-20 DIAGNOSIS — C3412 Malignant neoplasm of upper lobe, left bronchus or lung: Secondary | ICD-10-CM

## 2023-06-20 DIAGNOSIS — Z5111 Encounter for antineoplastic chemotherapy: Secondary | ICD-10-CM | POA: Diagnosis not present

## 2023-06-20 MED ORDER — SODIUM CHLORIDE 0.9 % IV SOLN
10.0000 mg | Freq: Once | INTRAVENOUS | Status: AC
  Administered 2023-06-20: 10 mg via INTRAVENOUS
  Filled 2023-06-20: qty 10

## 2023-06-20 MED ORDER — HEPARIN SOD (PORK) LOCK FLUSH 100 UNIT/ML IV SOLN
500.0000 [IU] | Freq: Once | INTRAVENOUS | Status: AC | PRN
  Administered 2023-06-20: 500 [IU]

## 2023-06-20 MED ORDER — SODIUM CHLORIDE 0.9 % IV SOLN
100.0000 mg/m2 | Freq: Once | INTRAVENOUS | Status: AC
  Administered 2023-06-20: 200 mg via INTRAVENOUS
  Filled 2023-06-20: qty 10

## 2023-06-20 MED ORDER — SODIUM CHLORIDE 0.9% FLUSH
10.0000 mL | INTRAVENOUS | Status: DC | PRN
  Administered 2023-06-20: 10 mL

## 2023-06-20 MED ORDER — SODIUM CHLORIDE 0.9 % IV SOLN: Freq: Once | INTRAVENOUS | Status: AC

## 2023-06-20 NOTE — Patient Instructions (Signed)
Etoposide Injection What is this medication? ETOPOSIDE (e toe POE side) treats some types of cancer. It works by slowing down the growth of cancer cells. This medicine may be used for other purposes; ask your health care provider or pharmacist if you have questions. COMMON BRAND NAME(S): Etopophos, Toposar, VePesid What should I tell my care team before I take this medication? They need to know if you have any of these conditions: Infection Kidney disease Liver disease Low blood counts, such as low white cell, platelet, red cell counts An unusual or allergic reaction to etoposide, other medications, foods, dyes, or preservatives If you or your partner are pregnant or trying to get pregnant Breastfeeding How should I use this medication? This medication is injected into a vein. It is given by your care team in a hospital or clinic setting. Talk to your care team about the use of this medication in children. Special care may be needed. Overdosage: If you think you have taken too much of this medicine contact a poison control center or emergency room at once. NOTE: This medicine is only for you. Do not share this medicine with others. What if I miss a dose? Keep appointments for follow-up doses. It is important not to miss your dose. Call your care team if you are unable to keep an appointment. What may interact with this medication? Warfarin This list may not describe all possible interactions. Give your health care provider a list of all the medicines, herbs, non-prescription drugs, or dietary supplements you use. Also tell them if you smoke, drink alcohol, or use illegal drugs. Some items may interact with your medicine. What should I watch for while using this medication? Your condition will be monitored carefully while you are receiving this medication. This medication may make you feel generally unwell. This is not uncommon as chemotherapy can affect healthy cells as well as cancer  cells. Report any side effects. Continue your course of treatment even though you feel ill unless your care team tells you to stop. This medication can cause serious side effects. To reduce the risk, your care team may give you other medications to take before receiving this one. Be sure to follow the directions from your care team. This medication may increase your risk of getting an infection. Call your care team for advice if you get a fever, chills, sore throat, or other symptoms of a cold or flu. Do not treat yourself. Try to avoid being around people who are sick. This medication may increase your risk to bruise or bleed. Call your care team if you notice any unusual bleeding. Talk to your care team about your risk of cancer. You may be more at risk for certain types of cancers if you take this medication. Talk to your care team if you may be pregnant. Serious birth defects can occur if you take this medication during pregnancy and for 6 months after the last dose. You will need a negative pregnancy test before starting this medication. Contraception is recommended while taking this medication and for 6 months after the last dose. Your care team can help you find the option that works for you. If your partner can get pregnant, use a condom during sex while taking this medication and for 4 months after the last dose. Do not breastfeed while taking this medication. This medication may cause infertility. Talk to your care team if you are concerned about your fertility. What side effects may I notice from receiving this medication?  Side effects that you should report to your care team as soon as possible: Allergic reactions--skin rash, itching, hives, swelling of the face, lips, tongue, or throat Infection--fever, chills, cough, sore throat, wounds that don't heal, pain or trouble when passing urine, general feeling of discomfort or being unwell Low red blood cell level--unusual weakness or fatigue,  dizziness, headache, trouble breathing Unusual bruising or bleeding Side effects that usually do not require medical attention (report to your care team if they continue or are bothersome): Diarrhea Fatigue Hair loss Loss of appetite Nausea Vomiting This list may not describe all possible side effects. Call your doctor for medical advice about side effects. You may report side effects to FDA at 1-800-FDA-1088. Where should I keep my medication? This medication is given in a hospital or clinic. It will not be stored at home. NOTE: This sheet is a summary. It may not cover all possible information. If you have questions about this medicine, talk to your doctor, pharmacist, or health care provider.  2024 Elsevier/Gold Standard (2022-03-30 00:00:00)

## 2023-06-21 ENCOUNTER — Telehealth: Payer: Self-pay

## 2023-06-21 NOTE — Telephone Encounter (Signed)
I called to check on pt. He states, "I'm feeling fine". He denies N/V, mouth sores, constipation, diarrhea, SOB, cough, chest pain, skin rash/itching, fever and chills. Pt reminded to call us if he develops temp of 100.4 or higher, day or night. He verbalized understanding. I also encouraged him to call us (option for triage nurse) if he has any questions/concerns that may arise before his next scheduled appt w/Dr Melvyn Neth. Pt is also receiving radiation daily, started on 06/18/2023.

## 2023-06-25 ENCOUNTER — Telehealth: Payer: Self-pay

## 2023-06-25 ENCOUNTER — Other Ambulatory Visit: Payer: Self-pay | Admitting: Hematology and Oncology

## 2023-06-25 MED ORDER — FLUCONAZOLE 100 MG PO TABS: 100.0000 mg | ORAL_TABLET | Freq: Every day | ORAL | 0 refills | Status: DC

## 2023-06-25 MED ORDER — NYSTATIN 100000 UNIT/GM EX POWD: 1.0000 | Freq: Three times a day (TID) | CUTANEOUS | 1 refills | Status: DC

## 2023-06-25 NOTE — Progress Notes (Unsigned)
Patient here for radiation, red rash across lower abdomen, under pannus, and in bilateral groins. Will rx fluconazole and nystatin powder.

## 2023-06-25 NOTE — Telephone Encounter (Signed)
Pt denies pain and itching. The red rash looks like yeast. The redness covers his entire lower abdominal fold & bilateral groin. No open areas. Kelli,PA, in to take quick look and has decided to send pt in some powder to be applied to the area. She is also sending in oral antifungal medication.

## 2023-06-29 ENCOUNTER — Encounter: Payer: Self-pay | Admitting: Oncology

## 2023-07-03 ENCOUNTER — Other Ambulatory Visit: Payer: Self-pay

## 2023-07-03 DIAGNOSIS — C3412 Malignant neoplasm of upper lobe, left bronchus or lung: Secondary | ICD-10-CM

## 2023-07-03 MED ORDER — CEPHALEXIN 500 MG PO CAPS
500.0000 mg | ORAL_CAPSULE | Freq: Three times a day (TID) | ORAL | 0 refills | Status: DC
Start: 2023-07-03 — End: 2023-07-13

## 2023-07-04 NOTE — Progress Notes (Unsigned)
[facility]  [address] [place],  [place]  [address] [phone]  Clinic Day:  07/05/2023  Referring physician: Gordan Payment., MD   HISTORY OF PRESENT ILLNESS:  The patient is a 77 y.o. male with limited stage small cell lung cancer.  He comes in today to be evaluated before heading into his second cycle of cisplatin/etoposide, which she is receiving in conjunction with daily radiation.  The patient claims already knows the significant improvement in his shortness of breath since his chemoradiation commenced.  The only problem he is once recently is that the skin over his port is very red and highly suspicious for infection.  Of note, he was prescribed Keflex 500 mg 3 times daily yesterday, for which he continues to take.   PHYSICAL EXAM:  Blood pressure 121/62, pulse 66, temperature 98.1 F (36.7 C), resp. rate 20, height 5\' 6"  (1.676 m), weight 193 lb 1.6 oz (87.6 kg), SpO2 95%. Wt Readings from Last 3 Encounters:  07/05/23 193 lb 1.6 oz (87.6 kg)  06/20/23 203 lb (92.1 kg)  06/19/23 200 lb (90.7 kg)   Body mass index is 31.17 kg/m. Performance status (ECOG): 1 - Symptomatic but completely ambulatory Physical Exam Constitutional:      Appearance: Normal appearance. He is not ill-appearing.  HENT:     Mouth/Throat:     Mouth: Mucous membranes are moist.     Pharynx: Oropharynx is clear. No oropharyngeal exudate or posterior oropharyngeal erythema.  Cardiovascular:     Rate and Rhythm: Normal rate and regular rhythm.     Heart sounds: No murmur heard.    No friction rub. No gallop.  Pulmonary:     Effort: Pulmonary effort is normal. No respiratory distress.     Breath sounds: Normal breath sounds. No wheezing, rhonchi or rales.  Abdominal:     General: Bowel sounds are normal. There is no distension.     Palpations: Abdomen is soft. There is no mass.     Tenderness: There is no abdominal tenderness.  Musculoskeletal:        General:  No swelling.     Right lower leg: No edema.     Left lower leg: No edema.  Lymphadenopathy:     Cervical: No cervical adenopathy.     Upper Body:     Right upper body: No supraclavicular or axillary adenopathy.     Left upper body: No supraclavicular or axillary adenopathy.     Lower Body: No right inguinal adenopathy. No left inguinal adenopathy.  Skin:    General: Skin is warm.     Coloration: Skin is not jaundiced.     Findings: No lesion or rash.  Neurological:     General: No focal deficit present.     Mental Status: He is alert and oriented to person, place, and time. Mental status is at baseline.  Psychiatric:        Mood and Affect: Mood normal.        Behavior: Behavior normal.        Thought Content: Thought content normal.   PATHOLOGY:  His recent bronchoscopy with biopsy revealed the following: Final Diagnosis   Lymph node, station 11L, FNA (smears, cell block): Small cell carcinoma. See comment.    Electronically signed by Denny Peon, MD on 05/31/2023 at  8:40 AM  Diagnosis Comment   Fragments of tumor in the cellblock are positive for cytokeratin AE1/AE3, CD56, and synaptophysin.  A chromogranin stain  appears largely negative.   LABS:      Latest Ref Rng & Units 07/05/2023    9:31 AM 06/14/2023    8:46 AM 06/06/2023   12:00 AM  CBC  WBC 4.0 - 10.5 K/uL 4.2  6.7  9.6      Hemoglobin 13.0 - 17.0 g/dL 65.7  84.6  96.2      Hematocrit 39.0 - 52.0 % 33.6  39.3  41      Platelets 150 - 400 K/uL 266  186  221         This result is from an external source.      Latest Ref Rng & Units 07/05/2023    9:31 AM 06/14/2023    8:46 AM 06/05/2023    1:54 PM  CMP  Glucose 70 - 99 mg/dL 952  92  841   BUN 8 - 23 mg/dL 12  14  19    Creatinine 0.61 - 1.24 mg/dL 3.24  4.01  0.27   Sodium 135 - 145 mmol/L 138  140  137   Potassium 3.5 - 5.1 mmol/L 3.7  3.6  4.4   Chloride 98 - 111 mmol/L 99  103  104   CO2 22 - 32 mmol/L 28  26  22    Calcium 8.9 - 10.3 mg/dL 9.3   9.1  9.1   Total Protein 6.5 - 8.1 g/dL 7.1  6.7  6.6   Total Bilirubin 0.3 - 1.2 mg/dL 0.3  0.5  0.8   Alkaline Phos 38 - 126 U/L 112  98  96   AST 15 - 41 U/L 16  18  31    ALT 0 - 44 U/L 12  12  20      ASSESSMENT & PLAN:  A 77 y.o. male who has limited stage small cell lung cancer.  He will proceed with his second cycle of cisplatin/etoposide early next week.  As mentioned previously, this is being given in conjunction with radiation for curative intent.  The only issue today is the skin infection over his port, for which he is currently taking Keflex.  If this area does not improve by next week, the patient understands his second cycle of cisplatin/etoposide may need to be given via a peripheral IV.  He will continue to take his radiation on a daily basis.  Clinically, the patient appears to be doing . I will see him back in 3 weeks before he heads into his third cycle of cisplatin/etoposide.  The patient and his family understand all the plans discussed today and are in agreement with them.     Kirby Funk, MD

## 2023-07-05 ENCOUNTER — Encounter: Payer: Self-pay | Admitting: Oncology

## 2023-07-05 ENCOUNTER — Inpatient Hospital Stay: Payer: Medicare HMO | Attending: Oncology | Admitting: Oncology

## 2023-07-05 ENCOUNTER — Inpatient Hospital Stay: Payer: Medicare HMO

## 2023-07-05 ENCOUNTER — Encounter: Payer: Self-pay | Admitting: Surgery

## 2023-07-05 DIAGNOSIS — Z923 Personal history of irradiation: Secondary | ICD-10-CM | POA: Diagnosis not present

## 2023-07-05 DIAGNOSIS — C3412 Malignant neoplasm of upper lobe, left bronchus or lung: Secondary | ICD-10-CM

## 2023-07-05 DIAGNOSIS — Z5111 Encounter for antineoplastic chemotherapy: Secondary | ICD-10-CM | POA: Insufficient documentation

## 2023-07-05 LAB — CBC WITH DIFFERENTIAL (CANCER CENTER ONLY)
Abs Immature Granulocytes: 0.47 10*3/uL — ABNORMAL HIGH (ref 0.00–0.07)
Basophils Absolute: 0.1 10*3/uL (ref 0.0–0.1)
Basophils Relative: 2 %
Eosinophils Absolute: 0.1 10*3/uL (ref 0.0–0.5)
Eosinophils Relative: 2 %
HCT: 33.6 % — ABNORMAL LOW (ref 39.0–52.0)
Hemoglobin: 10.8 g/dL — ABNORMAL LOW (ref 13.0–17.0)
Immature Granulocytes: 11 %
Lymphocytes Relative: 25 %
Lymphs Abs: 1.1 10*3/uL (ref 0.7–4.0)
MCH: 28.9 pg (ref 26.0–34.0)
MCHC: 32.1 g/dL (ref 30.0–36.0)
MCV: 89.8 fL (ref 80.0–100.0)
Monocytes Absolute: 0.8 10*3/uL (ref 0.1–1.0)
Monocytes Relative: 20 %
Neutro Abs: 1.7 10*3/uL (ref 1.7–7.7)
Neutrophils Relative %: 40 %
Platelet Count: 266 10*3/uL (ref 150–400)
RBC: 3.74 MIL/uL — ABNORMAL LOW (ref 4.22–5.81)
RDW: 13.6 % (ref 11.5–15.5)
WBC Count: 4.2 10*3/uL (ref 4.0–10.5)
nRBC: 0 % (ref 0.0–0.2)

## 2023-07-05 LAB — CMP (CANCER CENTER ONLY)
ALT: 12 U/L (ref 0–44)
AST: 16 U/L (ref 15–41)
Albumin: 3.2 g/dL — ABNORMAL LOW (ref 3.5–5.0)
Alkaline Phosphatase: 112 U/L (ref 38–126)
Anion gap: 11 (ref 5–15)
BUN: 12 mg/dL (ref 8–23)
CO2: 28 mmol/L (ref 22–32)
Calcium: 9.3 mg/dL (ref 8.9–10.3)
Chloride: 99 mmol/L (ref 98–111)
Creatinine: 0.92 mg/dL (ref 0.61–1.24)
GFR, Estimated: >60 mL/min (ref >60)
Glucose, Bld: 121 mg/dL — ABNORMAL HIGH (ref 70–99)
Potassium: 3.7 mmol/L (ref 3.5–5.1)
Sodium: 138 mmol/L (ref 135–145)
Total Bilirubin: 0.3 mg/dL (ref 0.3–1.2)
Total Protein: 7.1 g/dL (ref 6.5–8.1)

## 2023-07-05 LAB — MAGNESIUM: Magnesium: 1.9 mg/dL (ref 1.7–2.4)

## 2023-07-06 MED FILL — Fosaprepitant Dimeglumine For IV Infusion 150 MG (Base Eq): INTRAVENOUS | Qty: 5 | Status: AC

## 2023-07-06 MED FILL — Dexamethasone Sodium Phosphate Inj 100 MG/10ML: INTRAMUSCULAR | Qty: 1 | Status: AC

## 2023-07-06 MED FILL — Cisplatin Inj 50 MG/50ML (1 MG/ML): INTRAVENOUS | Qty: 150 | Status: AC

## 2023-07-09 ENCOUNTER — Inpatient Hospital Stay: Payer: Medicare HMO

## 2023-07-09 VITALS — BP 135/75 | HR 74 | Temp 98.1°F | Resp 18 | Ht 66.0 in | Wt 196.1 lb

## 2023-07-09 DIAGNOSIS — C3412 Malignant neoplasm of upper lobe, left bronchus or lung: Secondary | ICD-10-CM

## 2023-07-09 DIAGNOSIS — Z5111 Encounter for antineoplastic chemotherapy: Secondary | ICD-10-CM | POA: Diagnosis not present

## 2023-07-09 MED ORDER — HEPARIN SOD (PORK) LOCK FLUSH 100 UNIT/ML IV SOLN
500.0000 [IU] | Freq: Once | INTRAVENOUS | Status: AC | PRN
  Administered 2023-07-09: 500 [IU]

## 2023-07-09 MED ORDER — SODIUM CHLORIDE 0.9 % IV SOLN
150.0000 mg | Freq: Once | INTRAVENOUS | Status: AC
  Administered 2023-07-09: 150 mg via INTRAVENOUS
  Filled 2023-07-09: qty 150

## 2023-07-09 MED ORDER — MAGNESIUM SULFATE 2 GM/50ML IV SOLN
2.0000 g | Freq: Once | INTRAVENOUS | Status: AC
  Administered 2023-07-09: 2 g via INTRAVENOUS
  Filled 2023-07-09: qty 50

## 2023-07-09 MED ORDER — POTASSIUM CHLORIDE IN NACL 20-0.9 MEQ/L-% IV SOLN
Freq: Once | INTRAVENOUS | Status: AC
  Filled 2023-07-09: qty 1000

## 2023-07-09 MED ORDER — SODIUM CHLORIDE 0.9% FLUSH
10.0000 mL | INTRAVENOUS | Status: DC | PRN
  Administered 2023-07-09: 10 mL

## 2023-07-09 MED ORDER — SODIUM CHLORIDE 0.9 % IV SOLN
100.0000 mg/m2 | Freq: Once | INTRAVENOUS | Status: AC
  Administered 2023-07-09: 200 mg via INTRAVENOUS
  Filled 2023-07-09: qty 10

## 2023-07-09 MED ORDER — SODIUM CHLORIDE 0.9 % IV SOLN: Freq: Once | INTRAVENOUS | Status: AC

## 2023-07-09 MED ORDER — PALONOSETRON HCL INJECTION 0.25 MG/5ML
0.2500 mg | Freq: Once | INTRAVENOUS | Status: AC
  Administered 2023-07-09: 0.25 mg via INTRAVENOUS
  Filled 2023-07-09: qty 5

## 2023-07-09 MED ORDER — SODIUM CHLORIDE 0.9 % IV SOLN
10.0000 mg | Freq: Once | INTRAVENOUS | Status: AC
  Administered 2023-07-09: 10 mg via INTRAVENOUS
  Filled 2023-07-09: qty 10

## 2023-07-09 MED ORDER — SODIUM CHLORIDE 0.9 % IV SOLN
80.0000 mg/m2 | Freq: Once | INTRAVENOUS | Status: AC
  Administered 2023-07-09: 150 mg via INTRAVENOUS
  Filled 2023-07-09: qty 150

## 2023-07-09 MED FILL — Etoposide Inj 1 GM/50ML (20 MG/ML): INTRAVENOUS | Qty: 10 | Status: AC

## 2023-07-09 MED FILL — Dexamethasone Sodium Phosphate Inj 100 MG/10ML: INTRAMUSCULAR | Qty: 1 | Status: AC

## 2023-07-09 NOTE — Patient Instructions (Signed)
[facility]  Discharge Instructions: Thank you for choosing [facility] to provide your oncology and hematology care.  If you have a lab appointment with the Cancer Center, please go directly to the Cancer Center and check in at the registration area.   Wear comfortable clothing and clothing appropriate for easy access to any Portacath or PICC line.   We strive to give you quality time with your provider. You may need to reschedule your appointment if you arrive late (15 or more minutes).  Arriving late affects you and other patients whose appointments are after yours.  Also, if you miss three or more appointments without notifying the office, you may be dismissed from the clinic at the provider's discretion.      For prescription refill requests, have your pharmacy contact our office and allow 72 hours for refills to be completed.    Today you received the following chemotherapy and/or immunotherapy agents cisplatin, etoposide      To help prevent nausea and vomiting after your treatment, we encourage you to take your nausea medication as directed.  BELOW ARE SYMPTOMS THAT SHOULD BE REPORTED IMMEDIATELY: *FEVER GREATER THAN 100.4 F (38 C) OR HIGHER *CHILLS OR SWEATING *NAUSEA AND VOMITING THAT IS NOT CONTROLLED WITH YOUR NAUSEA MEDICATION *UNUSUAL SHORTNESS OF BREATH *UNUSUAL BRUISING OR BLEEDING *URINARY PROBLEMS (pain or burning when urinating, or frequent urination) *BOWEL PROBLEMS (unusual diarrhea, constipation, pain near the anus) TENDERNESS IN MOUTH AND THROAT WITH OR WITHOUT PRESENCE OF ULCERS (sore throat, sores in mouth, or a toothache) UNUSUAL RASH, SWELLING OR PAIN  UNUSUAL VAGINAL DISCHARGE OR ITCHING   Items with * indicate a potential emergency and should be followed up as soon as possible or go to the Emergency Department if any problems should occur.  Please show the CHEMOTHERAPY ALERT CARD or IMMUNOTHERAPY ALERT CARD at check-in  to the Emergency Department and triage nurse.  Should you have questions after your visit or need to cancel or reschedule your appointment, please contact [facility]  Dept: [phone]  and follow the prompts.  Office hours are 8:00 a.m. to 4:30 p.m. Monday - Friday. Please note that voicemails left after 4:00 p.m. may not be returned until the following business day.  We are closed weekends and major holidays. You have access to a nurse at all times for urgent questions. Please call the main number to the clinic Dept: [phone] and follow the prompts.  For any non-urgent questions, you may also contact your provider using MyChart. We now offer e-Visits for anyone 46 and older to request care online for non-urgent symptoms. For details visit [email or web address].   Also download the MyChart app! Go to the app store, search "MyChart", open the app, select [place], and log in with your MyChart username and password.

## 2023-07-10 ENCOUNTER — Inpatient Hospital Stay: Payer: Medicare HMO

## 2023-07-10 ENCOUNTER — Encounter: Payer: Self-pay | Admitting: Oncology

## 2023-07-10 VITALS — BP 137/74 | HR 60 | Temp 97.7°F | Resp 18 | Ht 66.0 in | Wt 202.2 lb

## 2023-07-10 DIAGNOSIS — Z5111 Encounter for antineoplastic chemotherapy: Secondary | ICD-10-CM | POA: Diagnosis not present

## 2023-07-10 DIAGNOSIS — C3412 Malignant neoplasm of upper lobe, left bronchus or lung: Secondary | ICD-10-CM

## 2023-07-10 MED ORDER — SODIUM CHLORIDE 0.9 % IV SOLN: Freq: Once | INTRAVENOUS | Status: AC

## 2023-07-10 MED ORDER — SODIUM CHLORIDE 0.9 % IV SOLN
10.0000 mg | Freq: Once | INTRAVENOUS | Status: AC
  Administered 2023-07-10: 10 mg via INTRAVENOUS
  Filled 2023-07-10: qty 10

## 2023-07-10 MED ORDER — HEPARIN SOD (PORK) LOCK FLUSH 100 UNIT/ML IV SOLN
500.0000 [IU] | Freq: Once | INTRAVENOUS | Status: AC | PRN
  Administered 2023-07-10: 500 [IU]

## 2023-07-10 MED ORDER — SODIUM CHLORIDE 0.9 % IV SOLN
100.0000 mg/m2 | Freq: Once | INTRAVENOUS | Status: AC
  Administered 2023-07-10: 200 mg via INTRAVENOUS
  Filled 2023-07-10: qty 10

## 2023-07-10 MED ORDER — SODIUM CHLORIDE 0.9% FLUSH
10.0000 mL | INTRAVENOUS | Status: DC | PRN
  Administered 2023-07-10: 10 mL

## 2023-07-10 MED FILL — Etoposide Inj 1 GM/50ML (20 MG/ML): INTRAVENOUS | Qty: 10 | Status: AC

## 2023-07-10 MED FILL — Dexamethasone Sodium Phosphate Inj 100 MG/10ML: INTRAMUSCULAR | Qty: 1 | Status: AC

## 2023-07-10 NOTE — Patient Instructions (Signed)
[place] [place]  Discharge Instructions: Thank you for choosing [facility] to provide your oncology and hematology care.  If you have a lab appointment with the Cancer Center, please go directly to the Cancer Center and check in at the registration area.   Wear comfortable clothing and clothing appropriate for easy access to any Portacath or PICC line.   We strive to give you quality time with your provider. You may need to reschedule your appointment if you arrive late (15 or more minutes).  Arriving late affects you and other patients whose appointments are after yours.  Also, if you miss three or more appointments without notifying the office, you may be dismissed from the clinic at the provider's discretion.      For prescription refill requests, have your pharmacy contact our office and allow 72 hours for refills to be completed.    Today you received the following chemotherapy and/or immunotherapy agents etoposide      To help prevent nausea and vomiting after your treatment, we encourage you to take your nausea medication as directed.  BELOW ARE SYMPTOMS THAT SHOULD BE REPORTED IMMEDIATELY: *FEVER GREATER THAN 100.4 F (38 C) OR HIGHER *CHILLS OR SWEATING *NAUSEA AND VOMITING THAT IS NOT CONTROLLED WITH YOUR NAUSEA MEDICATION *UNUSUAL SHORTNESS OF BREATH *UNUSUAL BRUISING OR BLEEDING *URINARY PROBLEMS (pain or burning when urinating, or frequent urination) *BOWEL PROBLEMS (unusual diarrhea, constipation, pain near the anus) TENDERNESS IN MOUTH AND THROAT WITH OR WITHOUT PRESENCE OF ULCERS (sore throat, sores in mouth, or a toothache) UNUSUAL RASH, SWELLING OR PAIN  UNUSUAL VAGINAL DISCHARGE OR ITCHING   Items with * indicate a potential emergency and should be followed up as soon as possible or go to the Emergency Department if any problems should occur.  Please show the CHEMOTHERAPY ALERT CARD or IMMUNOTHERAPY ALERT CARD at check-in to the  Emergency Department and triage nurse.  Should you have questions after your visit or need to cancel or reschedule your appointment, please contact [place] [facility] [place]  Dept: [phone]  and follow the prompts.  Office hours are 8:00 a.m. to 4:30 p.m. Monday - Friday. Please note that voicemails left after 4:00 p.m. may not be returned until the following business day.  We are closed weekends and major holidays. You have access to a nurse at all times for urgent questions. Please call the main number to the clinic Dept: [phone] and follow the prompts.  For any non-urgent questions, you may also contact your provider using MyChart. We now offer e-Visits for anyone 18 and older to request care online for non-urgent symptoms. For details visit [email or web address].   Also download the MyChart app! Go to the app store, search "MyChart", open the app, select [place], and log in with your MyChart username and password.   

## 2023-07-11 ENCOUNTER — Inpatient Hospital Stay: Payer: Medicare HMO

## 2023-07-11 VITALS — BP 142/87 | HR 86 | Temp 97.4°F | Resp 18 | Ht 66.0 in | Wt 205.0 lb

## 2023-07-11 DIAGNOSIS — Z5111 Encounter for antineoplastic chemotherapy: Secondary | ICD-10-CM | POA: Diagnosis not present

## 2023-07-11 DIAGNOSIS — C3412 Malignant neoplasm of upper lobe, left bronchus or lung: Secondary | ICD-10-CM

## 2023-07-11 MED ORDER — SODIUM CHLORIDE 0.9% FLUSH
10.0000 mL | INTRAVENOUS | Status: DC | PRN
  Administered 2023-07-11: 10 mL

## 2023-07-11 MED ORDER — HEPARIN SOD (PORK) LOCK FLUSH 100 UNIT/ML IV SOLN
500.0000 [IU] | Freq: Once | INTRAVENOUS | Status: AC | PRN
  Administered 2023-07-11: 500 [IU]

## 2023-07-11 MED ORDER — SODIUM CHLORIDE 0.9 % IV SOLN: Freq: Once | INTRAVENOUS | Status: AC

## 2023-07-11 MED ORDER — SODIUM CHLORIDE 0.9 % IV SOLN
10.0000 mg | Freq: Once | INTRAVENOUS | Status: AC
  Administered 2023-07-11: 10 mg via INTRAVENOUS
  Filled 2023-07-11: qty 10

## 2023-07-11 MED ORDER — SODIUM CHLORIDE 0.9 % IV SOLN
100.0000 mg/m2 | Freq: Once | INTRAVENOUS | Status: AC
  Administered 2023-07-11: 200 mg via INTRAVENOUS
  Filled 2023-07-11: qty 10

## 2023-07-11 NOTE — Patient Instructions (Signed)
[place] [place]  Discharge Instructions: Thank you for choosing [facility] to provide your oncology and hematology care.  If you have a lab appointment with the Cancer Center, please go directly to the Cancer Center and check in at the registration area.   Wear comfortable clothing and clothing appropriate for easy access to any Portacath or PICC line.   We strive to give you quality time with your provider. You may need to reschedule your appointment if you arrive late (15 or more minutes).  Arriving late affects you and other patients whose appointments are after yours.  Also, if you miss three or more appointments without notifying the office, you may be dismissed from the clinic at the provider's discretion.      For prescription refill requests, have your pharmacy contact our office and allow 72 hours for refills to be completed.    Today you received the following chemotherapy and/or immunotherapy agents etoposide      To help prevent nausea and vomiting after your treatment, we encourage you to take your nausea medication as directed.  BELOW ARE SYMPTOMS THAT SHOULD BE REPORTED IMMEDIATELY: *FEVER GREATER THAN 100.4 F (38 C) OR HIGHER *CHILLS OR SWEATING *NAUSEA AND VOMITING THAT IS NOT CONTROLLED WITH YOUR NAUSEA MEDICATION *UNUSUAL SHORTNESS OF BREATH *UNUSUAL BRUISING OR BLEEDING *URINARY PROBLEMS (pain or burning when urinating, or frequent urination) *BOWEL PROBLEMS (unusual diarrhea, constipation, pain near the anus) TENDERNESS IN MOUTH AND THROAT WITH OR WITHOUT PRESENCE OF ULCERS (sore throat, sores in mouth, or a toothache) UNUSUAL RASH, SWELLING OR PAIN  UNUSUAL VAGINAL DISCHARGE OR ITCHING   Items with * indicate a potential emergency and should be followed up as soon as possible or go to the Emergency Department if any problems should occur.  Please show the CHEMOTHERAPY ALERT CARD or IMMUNOTHERAPY ALERT CARD at check-in to the  Emergency Department and triage nurse.  Should you have questions after your visit or need to cancel or reschedule your appointment, please contact [place] [facility] [place]  Dept: [phone]  and follow the prompts.  Office hours are 8:00 a.m. to 4:30 p.m. Monday - Friday. Please note that voicemails left after 4:00 p.m. may not be returned until the following business day.  We are closed weekends and major holidays. You have access to a nurse at all times for urgent questions. Please call the main number to the clinic Dept: [phone] and follow the prompts.  For any non-urgent questions, you may also contact your provider using MyChart. We now offer e-Visits for anyone 18 and older to request care online for non-urgent symptoms. For details visit [email or web address].   Also download the MyChart app! Go to the app store, search "MyChart", open the app, select [place], and log in with your MyChart username and password.   

## 2023-07-13 ENCOUNTER — Telehealth: Payer: Self-pay

## 2023-07-13 ENCOUNTER — Other Ambulatory Visit: Payer: Self-pay | Admitting: Oncology

## 2023-07-13 DIAGNOSIS — R110 Nausea: Secondary | ICD-10-CM

## 2023-07-13 DIAGNOSIS — R531 Weakness: Secondary | ICD-10-CM

## 2023-07-13 MED ORDER — LEVOFLOXACIN 750 MG PO TABS: 750.0000 mg | ORAL_TABLET | Freq: Every day | ORAL | 0 refills | Status: DC

## 2023-07-13 NOTE — Telephone Encounter (Addendum)
After radiation pt was brought to [facility] clinic for evaluation of his right port a cath site. Pt reported to radiation therapist that his port was red and sore. Upon assessment, the area is quite red, with crusting/scabbing present. Small circular Band-Aid also present from [place] needle removal on Wednesday. Pt had chemo infusion this week on Mon, Tuesday and Wednesday. I cleaned area with saline and removed crustings. There is an open area on the left side of the port incision line. No obvious drainage seen. Dr Melvyn Neth in to evaluate and is going to order oral antibiotic. He also wants pt to see Dr Vinson Moselle, as he was surgeon who placed the port. I cleaned area well w/saline and placed clean 2x2 over incision line & port site, covered with tegaderm. Pt and wife instructed to change dressing if there is any visible drainage. Also he can remove the bandage when bathing, clean with soap & water, then reapply bandage. We will notify Dr Novella Olive office on Monday for further instructions.

## 2023-07-16 ENCOUNTER — Encounter: Payer: Self-pay | Admitting: Oncology

## 2023-07-25 NOTE — Progress Notes (Unsigned)
[facility]  [address] [place],  [place]  [address] [phone]  Clinic Day:  07/26/2023  Referring physician: Gordan Payment., MD   HISTORY OF PRESENT ILLNESS:  The patient is a 77 y.o. male with limited stage small cell lung cancer.  He comes in today to be evaluated before heading into his 3rd cycle of cisplatin/etoposide, which he is receiving in conjunction with daily radiation.  Since his last visit, the patient has been doing fine.  Of note, he only has 2 more days of radiation left.  The patient no longer has any shortness of breath since his chemoradiation commenced.   PHYSICAL EXAM:  Blood pressure 134/64, pulse 64, temperature 97.9 F (36.6 C), resp. rate 16, height 5\' 6"  (1.676 m), weight 194 lb 1.6 oz (88 kg), SpO2 94%. Wt Readings from Last 3 Encounters:  07/26/23 194 lb 1.6 oz (88 kg)  07/11/23 205 lb (93 kg)  07/10/23 202 lb 4 oz (91.7 kg)   Body mass index is 31.33 kg/m. Performance status (ECOG): 1 - Symptomatic but completely ambulatory Physical Exam Constitutional:      Appearance: Normal appearance. He is not ill-appearing.  HENT:     Mouth/Throat:     Mouth: Mucous membranes are moist.     Pharynx: Oropharynx is clear. No oropharyngeal exudate or posterior oropharyngeal erythema.  Cardiovascular:     Rate and Rhythm: Normal rate and regular rhythm.     Heart sounds: No murmur heard.    No friction rub. No gallop.  Pulmonary:     Effort: Pulmonary effort is normal. No respiratory distress.     Breath sounds: Normal breath sounds. No wheezing, rhonchi or rales.  Abdominal:     General: Bowel sounds are normal. There is no distension.     Palpations: Abdomen is soft. There is no mass.     Tenderness: There is no abdominal tenderness.  Musculoskeletal:        General: No swelling.     Right lower leg: No edema.     Left lower leg: No edema.  Lymphadenopathy:     Cervical: No cervical adenopathy.     Upper Body:      Right upper body: No supraclavicular or axillary adenopathy.     Left upper body: No supraclavicular or axillary adenopathy.     Lower Body: No right inguinal adenopathy. No left inguinal adenopathy.  Skin:    General: Skin is warm.     Coloration: Skin is not jaundiced.     Findings: No lesion or rash.  Neurological:     General: No focal deficit present.     Mental Status: He is alert and oriented to person, place, and time. Mental status is at baseline.  Psychiatric:        Mood and Affect: Mood normal.        Behavior: Behavior normal.        Thought Content: Thought content normal.   PATHOLOGY:  His recent bronchoscopy with biopsy revealed the following: Final Diagnosis   Lymph node, station 11L, FNA (smears, cell block): Small cell carcinoma. See comment.    Electronically signed by Denny Peon, MD on 05/31/2023 at  8:40 AM  Diagnosis Comment   Fragments of tumor in the cellblock are positive for cytokeratin AE1/AE3, CD56, and synaptophysin.  A chromogranin stain appears largely negative.   LABS:       Latest Ref Rng & Units 07/05/2023  9:31 AM 06/14/2023    8:46 AM 06/06/2023   12:00 AM  CBC  WBC 4.0 - 10.5 K/uL 4.2  6.7  9.6      Hemoglobin 13.0 - 17.0 g/dL 01.0  93.2  35.5      Hematocrit 39.0 - 52.0 % 33.6  39.3  41      Platelets 150 - 400 K/uL 266  186  221         This result is from an external source.      Latest Ref Rng & Units 07/05/2023    9:31 AM 06/14/2023    8:46 AM 06/05/2023    1:54 PM  CMP  Glucose 70 - 99 mg/dL 732  92  202   BUN 8 - 23 mg/dL 12  14  19    Creatinine 0.61 - 1.24 mg/dL 5.42  7.06  2.37   Sodium 135 - 145 mmol/L 138  140  137   Potassium 3.5 - 5.1 mmol/L 3.7  3.6  4.4   Chloride 98 - 111 mmol/L 99  103  104   CO2 22 - 32 mmol/L 28  26  22    Calcium 8.9 - 10.3 mg/dL 9.3  9.1  9.1   Total Protein 6.5 - 8.1 g/dL 7.1  6.7  6.6   Total Bilirubin 0.3 - 1.2 mg/dL 0.3  0.5  0.8   Alkaline Phos 38 - 126 U/L 112  98  96    AST 15 - 41 U/L 16  18  31    ALT 0 - 44 U/L 12  12  20      ASSESSMENT & PLAN:  A 78 y.o. male who has limited stage small cell lung cancer.  He will proceed with his 3rd cycle of cisplatin/etoposide early next week.  As mentioned previously, this is being given in conjunction with radiation for curative intent.  Clinically, the patient appears to be doing very well. I will see him back in 3 weeks before he heads into his 4th and final cycle of cisplatin/etoposide.  The patient and his family understand all the plans discussed today and are in agreement with them.    Verniece Encarnacion Kirby Funk, MD

## 2023-07-26 ENCOUNTER — Inpatient Hospital Stay (INDEPENDENT_AMBULATORY_CARE_PROVIDER_SITE_OTHER): Payer: Medicare HMO | Admitting: Oncology

## 2023-07-26 ENCOUNTER — Inpatient Hospital Stay: Payer: Medicare HMO | Attending: Oncology

## 2023-07-26 DIAGNOSIS — Z5111 Encounter for antineoplastic chemotherapy: Secondary | ICD-10-CM | POA: Diagnosis present

## 2023-07-26 DIAGNOSIS — C3412 Malignant neoplasm of upper lobe, left bronchus or lung: Secondary | ICD-10-CM | POA: Insufficient documentation

## 2023-07-26 LAB — CMP (CANCER CENTER ONLY)
ALT: 11 U/L (ref 0–44)
AST: 15 U/L (ref 15–41)
Albumin: 3.4 g/dL — ABNORMAL LOW (ref 3.5–5.0)
Alkaline Phosphatase: 99 U/L (ref 38–126)
Anion gap: 8 (ref 5–15)
BUN: 23 mg/dL (ref 8–23)
CO2: 28 mmol/L (ref 22–32)
Calcium: 9 mg/dL (ref 8.9–10.3)
Chloride: 99 mmol/L (ref 98–111)
Creatinine: 1.31 mg/dL — ABNORMAL HIGH (ref 0.61–1.24)
GFR, Estimated: 57 mL/min — ABNORMAL LOW (ref >60)
Glucose, Bld: 105 mg/dL — ABNORMAL HIGH (ref 70–99)
Potassium: 3.8 mmol/L (ref 3.5–5.1)
Sodium: 135 mmol/L (ref 135–145)
Total Bilirubin: 0.4 mg/dL (ref 0.3–1.2)
Total Protein: 6.9 g/dL (ref 6.5–8.1)

## 2023-07-26 LAB — CBC AND DIFFERENTIAL
HCT: 30 — AB (ref 41–53)
Hemoglobin: 10.4 — AB (ref 13.5–17.5)
Neutrophils Absolute: 0.24
Platelets: 106 10*3/uL — AB (ref 150–400)
WBC: 1.7

## 2023-07-26 LAB — CBC W DIFFERENTIAL (~~LOC~~ CC SCANNED REPORT)

## 2023-07-26 LAB — CBC: RBC: 3.43 — AB (ref 3.87–5.11)

## 2023-07-26 LAB — MAGNESIUM: Magnesium: 1.9 mg/dL (ref 1.7–2.4)

## 2023-07-27 ENCOUNTER — Encounter: Payer: Self-pay | Admitting: Oncology

## 2023-07-27 MED FILL — Cisplatin Inj 100 MG/100ML (1 MG/ML): INTRAVENOUS | Qty: 150 | Status: AC

## 2023-07-27 MED FILL — Dexamethasone Sodium Phosphate Inj 100 MG/10ML: INTRAMUSCULAR | Qty: 1 | Status: AC

## 2023-07-27 MED FILL — Fosaprepitant Dimeglumine For IV Infusion 150 MG (Base Eq): INTRAVENOUS | Qty: 5 | Status: AC

## 2023-07-28 ENCOUNTER — Other Ambulatory Visit: Payer: Self-pay

## 2023-07-30 ENCOUNTER — Encounter: Payer: Self-pay | Admitting: Oncology

## 2023-07-30 ENCOUNTER — Inpatient Hospital Stay: Payer: Medicare HMO

## 2023-07-30 VITALS — BP 127/54 | HR 72 | Temp 98.5°F | Resp 16 | Ht 66.0 in | Wt 196.1 lb

## 2023-07-30 DIAGNOSIS — Z5111 Encounter for antineoplastic chemotherapy: Secondary | ICD-10-CM | POA: Diagnosis not present

## 2023-07-30 DIAGNOSIS — C3412 Malignant neoplasm of upper lobe, left bronchus or lung: Secondary | ICD-10-CM

## 2023-07-30 MED ORDER — POTASSIUM CHLORIDE IN NACL 20-0.9 MEQ/L-% IV SOLN
Freq: Once | INTRAVENOUS | Status: AC
  Filled 2023-07-30: qty 1000

## 2023-07-30 MED ORDER — PALONOSETRON HCL INJECTION 0.25 MG/5ML
0.2500 mg | Freq: Once | INTRAVENOUS | Status: AC
  Administered 2023-07-30: 0.25 mg via INTRAVENOUS
  Filled 2023-07-30: qty 5

## 2023-07-30 MED ORDER — SODIUM CHLORIDE 0.9 % IV SOLN
150.0000 mg | Freq: Once | INTRAVENOUS | Status: AC
  Administered 2023-07-30: 150 mg via INTRAVENOUS
  Filled 2023-07-30: qty 150

## 2023-07-30 MED ORDER — MAGNESIUM SULFATE 2 GM/50ML IV SOLN
2.0000 g | Freq: Once | INTRAVENOUS | Status: AC
  Administered 2023-07-30: 2 g via INTRAVENOUS
  Filled 2023-07-30: qty 50

## 2023-07-30 MED ORDER — HEPARIN SOD (PORK) LOCK FLUSH 100 UNIT/ML IV SOLN
500.0000 [IU] | Freq: Once | INTRAVENOUS | Status: AC | PRN
  Administered 2023-07-30: 500 [IU]

## 2023-07-30 MED ORDER — SODIUM CHLORIDE 0.9 % IV SOLN
100.0000 mg/m2 | Freq: Once | INTRAVENOUS | Status: AC
  Administered 2023-07-30: 200 mg via INTRAVENOUS
  Filled 2023-07-30: qty 10

## 2023-07-30 MED ORDER — SODIUM CHLORIDE 0.9 % IV SOLN
10.0000 mg | Freq: Once | INTRAVENOUS | Status: AC
  Administered 2023-07-30: 10 mg via INTRAVENOUS
  Filled 2023-07-30: qty 10

## 2023-07-30 MED ORDER — SODIUM CHLORIDE 0.9 % IV SOLN
80.0000 mg/m2 | Freq: Once | INTRAVENOUS | Status: AC
  Administered 2023-07-30: 150 mg via INTRAVENOUS
  Filled 2023-07-30: qty 150

## 2023-07-30 MED ORDER — SODIUM CHLORIDE 0.9% FLUSH
10.0000 mL | INTRAVENOUS | Status: DC | PRN
  Administered 2023-07-30: 10 mL

## 2023-07-30 MED ORDER — SODIUM CHLORIDE 0.9 % IV SOLN: Freq: Once | INTRAVENOUS | Status: AC

## 2023-07-30 MED FILL — Etoposide Inj 1 GM/50ML (20 MG/ML): INTRAVENOUS | Qty: 10 | Status: AC

## 2023-07-30 MED FILL — Dexamethasone Sodium Phosphate Inj 100 MG/10ML: INTRAMUSCULAR | Qty: 1 | Status: AC

## 2023-07-30 NOTE — Patient Instructions (Signed)
[facility]  Discharge Instructions: Thank you for choosing [facility] to provide your oncology and hematology care.  If you have a lab appointment with the Cancer Center, please go directly to the Cancer Center and check in at the registration area.   Wear comfortable clothing and clothing appropriate for easy access to any Portacath or PICC line.   We strive to give you quality time with your provider. You may need to reschedule your appointment if you arrive late (15 or more minutes).  Arriving late affects you and other patients whose appointments are after yours.  Also, if you miss three or more appointments without notifying the office, you may be dismissed from the clinic at the provider's discretion.      For prescription refill requests, have your pharmacy contact our office and allow 72 hours for refills to be completed.    Today you received the following chemotherapy and/or immunotherapy agents cisplatin, etoposide      To help prevent nausea and vomiting after your treatment, we encourage you to take your nausea medication as directed.  BELOW ARE SYMPTOMS THAT SHOULD BE REPORTED IMMEDIATELY: *FEVER GREATER THAN 100.4 F (38 C) OR HIGHER *CHILLS OR SWEATING *NAUSEA AND VOMITING THAT IS NOT CONTROLLED WITH YOUR NAUSEA MEDICATION *UNUSUAL SHORTNESS OF BREATH *UNUSUAL BRUISING OR BLEEDING *URINARY PROBLEMS (pain or burning when urinating, or frequent urination) *BOWEL PROBLEMS (unusual diarrhea, constipation, pain near the anus) TENDERNESS IN MOUTH AND THROAT WITH OR WITHOUT PRESENCE OF ULCERS (sore throat, sores in mouth, or a toothache) UNUSUAL RASH, SWELLING OR PAIN  UNUSUAL VAGINAL DISCHARGE OR ITCHING   Items with * indicate a potential emergency and should be followed up as soon as possible or go to the Emergency Department if any problems should occur.  Please show the CHEMOTHERAPY ALERT CARD or IMMUNOTHERAPY ALERT CARD at check-in  to the Emergency Department and triage nurse.  Should you have questions after your visit or need to cancel or reschedule your appointment, please contact [facility]  Dept: [phone]  and follow the prompts.  Office hours are 8:00 a.m. to 4:30 p.m. Monday - Friday. Please note that voicemails left after 4:00 p.m. may not be returned until the following business day.  We are closed weekends and major holidays. You have access to a nurse at all times for urgent questions. Please call the main number to the clinic Dept: [phone] and follow the prompts.  For any non-urgent questions, you may also contact your provider using MyChart. We now offer e-Visits for anyone 46 and older to request care online for non-urgent symptoms. For details visit [email or web address].   Also download the MyChart app! Go to the app store, search "MyChart", open the app, select [place], and log in with your MyChart username and password.

## 2023-07-31 ENCOUNTER — Inpatient Hospital Stay: Payer: Medicare HMO

## 2023-07-31 ENCOUNTER — Encounter: Payer: Self-pay | Admitting: Oncology

## 2023-07-31 VITALS — BP 140/64 | HR 61 | Temp 97.9°F | Resp 20 | Ht 66.0 in | Wt 201.0 lb

## 2023-07-31 DIAGNOSIS — Z5111 Encounter for antineoplastic chemotherapy: Secondary | ICD-10-CM | POA: Diagnosis not present

## 2023-07-31 DIAGNOSIS — C3412 Malignant neoplasm of upper lobe, left bronchus or lung: Secondary | ICD-10-CM

## 2023-07-31 MED ORDER — SODIUM CHLORIDE 0.9 % IV SOLN
100.0000 mg/m2 | Freq: Once | INTRAVENOUS | Status: AC
  Administered 2023-07-31: 200 mg via INTRAVENOUS
  Filled 2023-07-31: qty 10

## 2023-07-31 MED ORDER — CEFTRIAXONE SODIUM 1 G IJ SOLR
1.0000 g | Freq: Once | INTRAMUSCULAR | Status: AC
  Administered 2023-07-31: 1 g via INTRAMUSCULAR
  Filled 2023-07-31: qty 10

## 2023-07-31 MED ORDER — SODIUM CHLORIDE 0.9 % IV SOLN
10.0000 mg | Freq: Once | INTRAVENOUS | Status: AC
  Administered 2023-07-31: 10 mg via INTRAVENOUS
  Filled 2023-07-31: qty 10

## 2023-07-31 MED ORDER — LIDOCAINE HCL (PF) 1 % IJ SOLN
5.0000 mL | Freq: Once | INTRAMUSCULAR | Status: AC
  Filled 2023-07-31: qty 5

## 2023-07-31 MED ORDER — SODIUM CHLORIDE 0.9 % IV SOLN: Freq: Once | INTRAVENOUS | Status: AC

## 2023-07-31 MED ORDER — LIDOCAINE HCL (PF) 1 % IJ SOLN
INTRAMUSCULAR | Status: AC
  Administered 2023-07-31: 5 mL
  Filled 2023-07-31: qty 5

## 2023-07-31 MED FILL — Etoposide Inj 1 GM/50ML (20 MG/ML): INTRAVENOUS | Qty: 10 | Status: AC

## 2023-07-31 MED FILL — Dexamethasone Sodium Phosphate Inj 100 MG/10ML: INTRAMUSCULAR | Qty: 1 | Status: AC

## 2023-07-31 NOTE — Patient Instructions (Signed)
[facility] AT [facility]  Discharge Instructions: Thank you for choosing [facility] to provide your oncology and hematology care.  If you have a lab appointment with the Cancer Center, please go directly to the Cancer Center and check in at the registration area.   Wear comfortable clothing and clothing appropriate for easy access to any Portacath or PICC line.   We strive to give you quality time with your provider. You may need to reschedule your appointment if you arrive late (15 or more minutes).  Arriving late affects you and other patients whose appointments are after yours.  Also, if you miss three or more appointments without notifying the office, you may be dismissed from the clinic at the provider's discretion.      For prescription refill requests, have your pharmacy contact our office and allow 72 hours for refills to be completed.    Today you received the following chemotherapy and/or immunotherapy agents: etoposide      To help prevent nausea and vomiting after your treatment, we encourage you to take your nausea medication as directed.  BELOW ARE SYMPTOMS THAT SHOULD BE REPORTED IMMEDIATELY: *FEVER GREATER THAN 100.4 F (38 C) OR HIGHER *CHILLS OR SWEATING *NAUSEA AND VOMITING THAT IS NOT CONTROLLED WITH YOUR NAUSEA MEDICATION *UNUSUAL SHORTNESS OF BREATH *UNUSUAL BRUISING OR BLEEDING *URINARY PROBLEMS (pain or burning when urinating, or frequent urination) *BOWEL PROBLEMS (unusual diarrhea, constipation, pain near the anus) TENDERNESS IN MOUTH AND THROAT WITH OR WITHOUT PRESENCE OF ULCERS (sore throat, sores in mouth, or a toothache) UNUSUAL RASH, SWELLING OR PAIN  UNUSUAL VAGINAL DISCHARGE OR ITCHING   Items with * indicate a potential emergency and should be followed up as soon as possible or go to the Emergency Department if any problems should occur.  Please show the CHEMOTHERAPY ALERT CARD or IMMUNOTHERAPY ALERT CARD at check-in to the  Emergency Department and triage nurse.  Should you have questions after your visit or need to cancel or reschedule your appointment, please contact [facility]  Dept: [phone]  and follow the prompts.  Office hours are 8:00 a.m. to 4:30 p.m. Monday - Friday. Please note that voicemails left after 4:00 p.m. may not be returned until the following business day.  We are closed weekends and major holidays. You have access to a nurse at all times for urgent questions. Please call the main number to the clinic Dept: [phone] and follow the prompts.  For any non-urgent questions, you may also contact your provider using MyChart. We now offer e-Visits for anyone 72 and older to request care online for non-urgent symptoms. For details visit [email or web address].   Also download the MyChart app! Go to the app store, search "MyChart", open the app, select [place], and log in with your MyChart username and password.  Ceftriaxone Injection What is this medication? CEFTRIAXONE (sef try AX one) treats infections caused by bacteria. It belongs to a group of medications called cephalosporin antibiotics. It will not treat colds, the flu, or infections caused by viruses. This medicine may be used for other purposes; ask your health care provider or pharmacist if you have questions. COMMON BRAND NAME(S): Ceftri-IM, Ceftrisol Plus, Rocephin What should I tell my care team before I take this medication? They need to know if you have any of these conditions: Bleeding disorder High bilirubin level in newborn patients Kidney disease Liver disease Poor nutrition An unusual or allergic reaction to ceftriaxone, other penicillin or cephalosporin  antibiotics, other medications, foods, dyes, or preservatives Pregnant or trying to get pregnant Breast-feeding How should I use this medication? This medication is injected into a vein or a muscle. It is usually given by your care team in  a hospital or clinic setting. It may also be given at home. If you get this medication at home, you will be taught how to prepare and give it. Use exactly as directed. Take it as directed on the prescription label at the same time every day. Keep taking it even if you think you are better. It is important that you put your used needles and syringes in a special sharps container. Do not put them in a trash can. If you do not have a sharps container, call your pharmacist or care team to get one. Talk to your care team about the use of this medication in children. While it may be prescribed for children as young as newborns for selected conditions, precautions do apply. Overdosage: If you think you have taken too much of this medicine contact a poison control center or emergency room at once. NOTE: This medicine is only for you. Do not share this medicine with others. What if I miss a dose? If you get this medication at the hospital or clinic: It is important not to miss your dose. Call your care team if you are unable to keep an appointment. If you give yourself this medication at home: If you miss a dose, take it as soon as you can. Then continue your normal schedule. If it is almost time for your next dose, take only that dose. Do not take double or extra doses. Call your care team with questions. What may interact with this medication? Estrogen or progestin hormones Intravenous calcium This list may not describe all possible interactions. Give your health care provider a list of all the medicines, herbs, non-prescription drugs, or dietary supplements you use. Also tell them if you smoke, drink alcohol, or use illegal drugs. Some items may interact with your medicine. What should I watch for while using this medication? Tell your care team if your symptoms do not start to get better or if they get worse. Do not treat diarrhea with over the counter products. Contact your care team if you have diarrhea  that lasts more than 2 days or if it is severe and watery. If you have diabetes, you may get a false-positive result for sugar in your urine. Check with your care team. If you are being treated for a sexually transmitted infection (STI), avoid sexual contact until you have finished your treatment. Your partner may also need treatment. What side effects may I notice from receiving this medication? Side effects that you should report to your care team as soon as possible: Allergic reactions--skin rash, itching, hives, swelling of the face, lips, tongue, or throat Hemolytic anemia--unusual weakness or fatigue, dizziness, headache, trouble breathing, dark urine, yellowing skin or eyes Severe diarrhea, fever Unusual vaginal discharge, itching, or odor Side effects that usually do not require medical attention (report to your care team if they continue or are bothersome): Diarrhea Headache Nausea Pain, redness, or irritation at injection site This list may not describe all possible side effects. Call your doctor for medical advice about side effects. You may report side effects to FDA at 1-800-FDA-1088. Where should I keep my medication? Keep out of the reach of children and pets. You will be instructed on how to store this medication. Get rid of any  unused medication after the expiration date. To get rid of medications that are no longer needed or have expired: Take the medication to a medication take-back program. Check with your pharmacy or law enforcement to find a location. If you cannot return the medication, ask your pharmacist or care team how to get rid of this medication safely. NOTE: This sheet is a summary. It may not cover all possible information. If you have questions about this medicine, talk to your doctor, pharmacist, or health care provider.  2024 Elsevier/Gold Standard (2022-01-16 00:00:00)

## 2023-08-01 ENCOUNTER — Inpatient Hospital Stay: Payer: Medicare HMO

## 2023-08-01 VITALS — BP 139/63 | HR 70 | Temp 97.4°F | Resp 20 | Wt 205.0 lb

## 2023-08-01 DIAGNOSIS — C3412 Malignant neoplasm of upper lobe, left bronchus or lung: Secondary | ICD-10-CM

## 2023-08-01 DIAGNOSIS — Z5111 Encounter for antineoplastic chemotherapy: Secondary | ICD-10-CM | POA: Diagnosis not present

## 2023-08-01 MED ORDER — SODIUM CHLORIDE 0.9 % IV SOLN: Freq: Once | INTRAVENOUS | Status: AC

## 2023-08-01 MED ORDER — SODIUM CHLORIDE 0.9 % IV SOLN
100.0000 mg/m2 | Freq: Once | INTRAVENOUS | Status: AC
  Administered 2023-08-01: 200 mg via INTRAVENOUS
  Filled 2023-08-01: qty 10

## 2023-08-01 MED ORDER — SODIUM CHLORIDE 0.9% FLUSH: 10.0000 mL | INTRAVENOUS | Status: DC | PRN

## 2023-08-01 MED ORDER — LIDOCAINE HCL (PF) 1 % IJ SOLN
5.0000 mL | Freq: Once | INTRAMUSCULAR | Status: AC
  Administered 2023-08-01: 5 mL
  Filled 2023-08-01: qty 5

## 2023-08-01 MED ORDER — HEPARIN SOD (PORK) LOCK FLUSH 100 UNIT/ML IV SOLN: 500.0000 [IU] | Freq: Once | INTRAVENOUS | Status: DC | PRN

## 2023-08-01 MED ORDER — SODIUM CHLORIDE 0.9 % IV SOLN
10.0000 mg | Freq: Once | INTRAVENOUS | Status: AC
  Administered 2023-08-01: 10 mg via INTRAVENOUS
  Filled 2023-08-01: qty 10

## 2023-08-01 MED ORDER — CEFTRIAXONE SODIUM 1 G IJ SOLR
1.0000 g | Freq: Once | INTRAMUSCULAR | Status: AC
  Administered 2023-08-01: 1 g via INTRAMUSCULAR
  Filled 2023-08-01: qty 10

## 2023-08-01 NOTE — Patient Instructions (Signed)
Etoposide Injection What is this medication? ETOPOSIDE (e toe POE side) treats some types of cancer. It works by slowing down the growth of cancer cells. This medicine may be used for other purposes; ask your health care provider or pharmacist if you have questions. COMMON BRAND NAME(S): Etopophos, Toposar, VePesid What should I tell my care team before I take this medication? They need to know if you have any of these conditions: Infection Kidney disease Liver disease Low blood counts, such as low white cell, platelet, red cell counts An unusual or allergic reaction to etoposide, other medications, foods, dyes, or preservatives If you or your partner are pregnant or trying to get pregnant Breastfeeding How should I use this medication? This medication is injected into a vein. It is given by your care team in a hospital or clinic setting. Talk to your care team about the use of this medication in children. Special care may be needed. Overdosage: If you think you have taken too much of this medicine contact a poison control center or emergency room at once. NOTE: This medicine is only for you. Do not share this medicine with others. What if I miss a dose? Keep appointments for follow-up doses. It is important not to miss your dose. Call your care team if you are unable to keep an appointment. What may interact with this medication? Warfarin This list may not describe all possible interactions. Give your health care provider a list of all the medicines, herbs, non-prescription drugs, or dietary supplements you use. Also tell them if you smoke, drink alcohol, or use illegal drugs. Some items may interact with your medicine. What should I watch for while using this medication? Your condition will be monitored carefully while you are receiving this medication. This medication may make you feel generally unwell. This is not uncommon as chemotherapy can affect healthy cells as well as cancer  cells. Report any side effects. Continue your course of treatment even though you feel ill unless your care team tells you to stop. This medication can cause serious side effects. To reduce the risk, your care team may give you other medications to take before receiving this one. Be sure to follow the directions from your care team. This medication may increase your risk of getting an infection. Call your care team for advice if you get a fever, chills, sore throat, or other symptoms of a cold or flu. Do not treat yourself. Try to avoid being around people who are sick. This medication may increase your risk to bruise or bleed. Call your care team if you notice any unusual bleeding. Talk to your care team about your risk of cancer. You may be more at risk for certain types of cancers if you take this medication. Talk to your care team if you may be pregnant. Serious birth defects can occur if you take this medication during pregnancy and for 6 months after the last dose. You will need a negative pregnancy test before starting this medication. Contraception is recommended while taking this medication and for 6 months after the last dose. Your care team can help you find the option that works for you. If your partner can get pregnant, use a condom during sex while taking this medication and for 4 months after the last dose. Do not breastfeed while taking this medication. This medication may cause infertility. Talk to your care team if you are concerned about your fertility. What side effects may I notice from receiving this medication?  Side effects that you should report to your care team as soon as possible: Allergic reactions--skin rash, itching, hives, swelling of the face, lips, tongue, or throat Infection--fever, chills, cough, sore throat, wounds that don't heal, pain or trouble when passing urine, general feeling of discomfort or being unwell Low red blood cell level--unusual weakness or fatigue,  dizziness, headache, trouble breathing Unusual bruising or bleeding Side effects that usually do not require medical attention (report to your care team if they continue or are bothersome): Diarrhea Fatigue Hair loss Loss of appetite Nausea Vomiting This list may not describe all possible side effects. Call your doctor for medical advice about side effects. You may report side effects to FDA at 1-800-FDA-1088. Where should I keep my medication? This medication is given in a hospital or clinic. It will not be stored at home. NOTE: This sheet is a summary. It may not cover all possible information. If you have questions about this medicine, talk to your doctor, pharmacist, or health care provider.  2024 Elsevier/Gold Standard (2022-03-30 00:00:00)

## 2023-08-02 ENCOUNTER — Inpatient Hospital Stay: Payer: Medicare HMO

## 2023-08-02 VITALS — BP 143/67 | HR 59 | Temp 98.0°F | Resp 18 | Ht 66.0 in | Wt 209.0 lb

## 2023-08-02 DIAGNOSIS — Z5111 Encounter for antineoplastic chemotherapy: Secondary | ICD-10-CM | POA: Diagnosis not present

## 2023-08-02 DIAGNOSIS — C3412 Malignant neoplasm of upper lobe, left bronchus or lung: Secondary | ICD-10-CM

## 2023-08-02 MED ORDER — PEGFILGRASTIM-CBQV 6 MG/0.6ML ~~LOC~~ SOSY
6.0000 mg | PREFILLED_SYRINGE | Freq: Once | SUBCUTANEOUS | Status: AC
  Administered 2023-08-02: 6 mg via SUBCUTANEOUS
  Filled 2023-08-02: qty 0.6

## 2023-08-02 NOTE — Patient Instructions (Signed)
Pegfilgrastim Injection What is this medication? PEGFILGRASTIM (PEG fil gra stim) lowers the risk of infection in people who are receiving chemotherapy. It works by Systems analyst make more white blood cells, which protects your body from infection. It may also be used to help people who have been exposed to high doses of radiation. This medicine may be used for other purposes; ask your health care provider or pharmacist if you have questions. COMMON BRAND NAME(S): Cherly Hensen, Neulasta, Nyvepria, Stimufend, UDENYCA, UDENYCA ONBODY, Ziextenzo What should I tell my care team before I take this medication? They need to know if you have any of these conditions: Kidney disease Latex allergy Ongoing radiation therapy Sickle cell disease Skin reactions to acrylic adhesives (On-Body Injector only) An unusual or allergic reaction to pegfilgrastim, filgrastim, other medications, foods, dyes, or preservatives Pregnant or trying to get pregnant Breast-feeding How should I use this medication? This medication is for injection under the skin. If you get this medication at home, you will be taught how to prepare and give the pre-filled syringe or how to use the On-body Injector. Refer to the patient Instructions for Use for detailed instructions. Use exactly as directed. Tell your care team immediately if you suspect that the On-body Injector may not have performed as intended or if you suspect the use of the On-body Injector resulted in a missed or partial dose. It is important that you put your used needles and syringes in a special sharps container. Do not put them in a trash can. If you do not have a sharps container, call your pharmacist or care team to get one. Talk to your care team about the use of this medication in children. While this medication may be prescribed for selected conditions, precautions do apply. Overdosage: If you think you have taken too much of this medicine contact a  poison control center or emergency room at once. NOTE: This medicine is only for you. Do not share this medicine with others. What if I miss a dose? It is important not to miss your dose. Call your care team if you miss your dose. If you miss a dose due to an On-body Injector failure or leakage, a new dose should be administered as soon as possible using a single prefilled syringe for manual use. What may interact with this medication? Interactions have not been studied. This list may not describe all possible interactions. Give your health care provider a list of all the medicines, herbs, non-prescription drugs, or dietary supplements you use. Also tell them if you smoke, drink alcohol, or use illegal drugs. Some items may interact with your medicine. What should I watch for while using this medication? Your condition will be monitored carefully while you are receiving this medication. You may need blood work done while you are taking this medication. Talk to your care team about your risk of cancer. You may be more at risk for certain types of cancer if you take this medication. If you are going to need a MRI, CT scan, or other procedure, tell your care team that you are using this medication (On-Body Injector only). What side effects may I notice from receiving this medication? Side effects that you should report to your care team as soon as possible: Allergic reactions--skin rash, itching, hives, swelling of the face, lips, tongue, or throat Capillary leak syndrome--stomach or muscle pain, unusual weakness or fatigue, feeling faint or lightheaded, decrease in the amount of urine, swelling of the ankles, hands, or  feet, trouble breathing High white blood cell level--fever, fatigue, trouble breathing, night sweats, change in vision, weight loss Inflammation of the aorta--fever, fatigue, back, chest, or stomach pain, severe headache Kidney injury (glomerulonephritis)--decrease in the amount of  urine, red or dark brown urine, foamy or bubbly urine, swelling of the ankles, hands, or feet Shortness of breath or trouble breathing Spleen injury--pain in upper left stomach or shoulder Unusual bruising or bleeding Side effects that usually do not require medical attention (report to your care team if they continue or are bothersome): Bone pain Pain in the hands or feet This list may not describe all possible side effects. Call your doctor for medical advice about side effects. You may report side effects to FDA at 1-800-FDA-1088. Where should I keep my medication? Keep out of the reach of children. If you are using this medication at home, you will be instructed on how to store it. Throw away any unused medication after the expiration date on the label. NOTE: This sheet is a summary. It may not cover all possible information. If you have questions about this medicine, talk to your doctor, pharmacist, or health care provider.  2024 Elsevier/Gold Standard (2021-10-07 00:00:00)  Constipation, Adult Constipation is when a person has trouble pooping (having a bowel movement). When you have this condition, you may poop fewer than 3 times a week. Your poop (stool) may also be dry, hard, or bigger than normal. Follow these instructions at home: Eating and drinking  Eat foods that have a lot of fiber, such as: Fresh fruits and vegetables. Whole grains. Beans. Eat less of foods that are low in fiber and high in fat and sugar, such as: [place] fries. Hamburgers. Cookies. Candy. Soda. Drink enough fluid to keep your pee (urine) pale yellow. General instructions Exercise regularly or as told by your doctor. Try to do 150 minutes of exercise each week. Go to the restroom when you feel like you need to poop. Do not hold it in. Take over-the-counter and prescription medicines only as told by your doctor. These include any fiber supplements. When you poop: Do deep breathing while relaxing your  lower belly (abdomen). Relax your pelvic floor. The pelvic floor is a group of muscles that support the rectum, bladder, and intestines (as well as the uterus in women). Watch your condition for any changes. Tell your doctor if you notice any. Keep all follow-up visits as told by your doctor. This is important. Contact a doctor if: You have pain that gets worse. You have a fever. You have not pooped for 4 days. You vomit. You are not hungry. You lose weight. You are bleeding from the opening of the butt (anus). You have thin, pencil-like poop. Get help right away if: You have a fever, and your symptoms suddenly get worse. You leak poop or have blood in your poop. Your belly feels hard or bigger than normal (bloated). You have very bad belly pain. You feel dizzy or you faint. Summary Constipation is when a person poops fewer than 3 times a week, has trouble pooping, or has poop that is dry, hard, or bigger than normal. Eat foods that have a lot of fiber. Drink enough fluid to keep your pee (urine) pale yellow. Take over-the-counter and prescription medicines only as told by your doctor. These include any fiber supplements. This information is not intended to replace advice given to you by your health care provider. Make sure you discuss any questions you have with your health care  provider. Document Revised: 09/20/2022 Document Reviewed: 09/20/2022 Elsevier Patient Education  2024 [employer].

## 2023-08-06 ENCOUNTER — Telehealth: Payer: Self-pay

## 2023-08-06 ENCOUNTER — Encounter: Payer: Self-pay | Admitting: Oncology

## 2023-08-06 ENCOUNTER — Inpatient Hospital Stay: Payer: Medicare HMO

## 2023-08-06 VITALS — BP 111/70 | HR 55 | Temp 98.2°F | Resp 18

## 2023-08-06 DIAGNOSIS — R531 Weakness: Secondary | ICD-10-CM

## 2023-08-06 DIAGNOSIS — R110 Nausea: Secondary | ICD-10-CM

## 2023-08-06 DIAGNOSIS — Z5111 Encounter for antineoplastic chemotherapy: Secondary | ICD-10-CM | POA: Diagnosis not present

## 2023-08-06 LAB — HEPATIC FUNCTION PANEL
ALT: 11 U/L (ref 10–40)
AST: 17 (ref 14–40)
Alkaline Phosphatase: 104 (ref 25–125)
Bilirubin, Total: 0.8

## 2023-08-06 LAB — CBC AND DIFFERENTIAL
HCT: 28 — AB (ref 41–53)
Hemoglobin: 9.7 — AB (ref 13.5–17.5)
Neutrophils Absolute: 0.31
Platelets: 88 10*3/uL — AB (ref 150–400)
WBC: 0.6

## 2023-08-06 LAB — BASIC METABOLIC PANEL
BUN: 29 — AB (ref 4–21)
CO2: 28 — AB (ref 13–22)
Chloride: 96 — AB (ref 99–108)
Creatinine: 0.9 (ref 0.6–1.3)
Glucose: 186
Potassium: 4.1 meq/L (ref 3.5–5.1)
Sodium: 128 — AB (ref 137–147)

## 2023-08-06 LAB — CBC: RBC: 3.21 — AB (ref 3.87–5.11)

## 2023-08-06 LAB — COMPREHENSIVE METABOLIC PANEL
Albumin: 3.8 (ref 3.5–5.0)
Calcium: 9.6 (ref 8.7–10.7)

## 2023-08-06 MED ORDER — DEXAMETHASONE SODIUM PHOSPHATE 10 MG/ML IJ SOLN
8.0000 mg | Freq: Once | INTRAMUSCULAR | Status: AC
  Administered 2023-08-06: 8 mg via INTRAVENOUS
  Filled 2023-08-06: qty 1

## 2023-08-06 MED ORDER — SODIUM CHLORIDE 0.9 % IV SOLN: Freq: Once | INTRAVENOUS | Status: AC

## 2023-08-06 MED ORDER — ONDANSETRON HCL 4 MG/2ML IJ SOLN
8.0000 mg | INTRAMUSCULAR | Status: DC | PRN
  Administered 2023-08-06: 8 mg via INTRAVENOUS
  Filled 2023-08-06: qty 4

## 2023-08-06 MED ORDER — SODIUM CHLORIDE 0.9% FLUSH
10.0000 mL | Freq: Once | INTRAVENOUS | Status: AC | PRN
  Administered 2023-08-06: 10 mL

## 2023-08-06 MED ORDER — SODIUM CHLORIDE 0.9% FLUSH: 3.0000 mL | Freq: Once | INTRAVENOUS | Status: DC | PRN

## 2023-08-06 MED ORDER — HEPARIN SOD (PORK) LOCK FLUSH 100 UNIT/ML IV SOLN
500.0000 [IU] | Freq: Once | INTRAVENOUS | Status: AC | PRN
  Administered 2023-08-06: 500 [IU]

## 2023-08-06 NOTE — Patient Instructions (Signed)
Dehydration, Adult Dehydration is a condition in which there is not enough water or other fluids in the body. This happens when a person loses more fluids than they take in. Important organs cannot work right without the right amount of fluids. Any loss of fluids from the body can cause dehydration. Dehydration can be mild, worse, or very bad. It should be treated right away to keep it from getting very bad. What are the causes? Conditions that cause loss of water in the body. They include: Watery poop (diarrhea). Vomiting. Sweating a lot. Fever. Infection. Peeing (urinating) a lot. Not drinking enough fluids. Certain medicines, such as medicines that take extra fluid out of the body (diuretics). Lack of safe drinking water. Not being able to get enough water and food. What increases the risk? Having a long-term (chronic) illness that has not been treated the right way, such as: Diabetes. Heart disease. Kidney disease. Being 47 years of age or older. Having a disability. Living in a place that is high above the ground or sea (high in altitude). The thinner, drier air causes more fluid loss. Doing exercises that put stress on your body for a long time. Being active when in hot places. What are the signs or symptoms? Symptoms of dehydration depend on how bad it is. Mild or worse dehydration Thirst. Dry lips or dry mouth. Feeling dizzy or light-headed. Muscle cramps. Passing little pee or dark pee. Pee may be the color of tea. Headache. Very bad dehydration Changes in skin. Skin may: Be cold to the touch (clammy). Be blotchy or pale. Not go back to normal right after you pinch it and let it go. Little or no tears, pee, or sweat. Fast breathing. Low blood pressure. Weak pulse. Pulse that is more than 100 beats a minute when you are sitting still. Other changes, such as: Feeling very thirsty. Eyes that look hollow (sunken). Cold hands and feet. Being confused. Being very  tired (lethargic) or having trouble waking from sleep. Losing weight. Loss of consciousness. How is this treated? Treatment for this condition depends on how bad your dehydration is. Treatment should start right away. Do not wait until your condition gets very bad. Very bad dehydration is an emergency. You will need to go to a hospital. Mild or worse dehydration can be treated at home. You may be asked to: Drink more fluids. Drink an oral rehydration solution (ORS). This drink gives you the right amount of fluids, salts, and minerals (electrolytes). Very bad dehydration can be treated: With fluids through an IV tube. By correcting low levels of electrolytes in the body. By treating the problem that caused your dehydration. Follow these instructions at home: Oral rehydration solution If told by your doctor, drink an ORS: Make an ORS. Use instructions on the package. Start by drinking small amounts, about  cup (120 mL) every 5-10 minutes. Slowly drink more until you have had the amount that your doctor said to have.  Eating and drinking  Drink enough clear fluid to keep your pee pale yellow. If you were told to drink an ORS, finish the ORS first. Then, start slowly drinking other clear fluids. Drink fluids such as: Water. Do not drink only water. Doing that can make the salt (sodium) level in your body get too low. Water from ice chips you suck on. Fruit juice that you have added water to (diluted). Low-calorie sports drinks. Eat foods that have the right amounts of salts and minerals, such as bananas, oranges, potatoes,  tomatoes, or spinach. Do not drink alcohol. Avoid drinks that have caffeine or sugar. These include:: High-calorie sports drinks. Fruit juice that you did not add water to. Soda. Coffee or energy drinks. Avoid foods that are greasy or have a lot of fat or sugar. General instructions Take over-the-counter and prescription medicines only as told by your doctor. Do  not take sodium tablets. Doing that can make the salt level in your body get too high. Return to your normal activities as told by your doctor. Ask your doctor what activities are safe for you. Keep all follow-up visits. Your doctor may check and change your treatment. Contact a doctor if: You have pain in your belly (abdomen) and the pain: Gets worse. Stays in one place. You have a rash. You have a stiff neck. You get angry or annoyed more easily than normal. You are more tired or have a harder time waking than normal. You feel weak or dizzy. You feel very thirsty. Get help right away if: You have any symptoms of very bad dehydration. You vomit every time you eat or drink. Your vomiting gets worse, does not go away, or you vomit blood or green stuff. You are getting treatment, but symptoms are getting worse. You have a fever. You have a very bad headache. You have: Diarrhea that gets worse or does not go away. Blood in your poop (stool). This may cause poop to look black and tarry. No pee in 6-8 hours. Only a small amount of pee in 6-8 hours, and the pee is very dark. You have trouble breathing. These symptoms may be an emergency. Get help right away. Call 911. Do not wait to see if the symptoms will go away. Do not drive yourself to the hospital. This information is not intended to replace advice given to you by your health care provider. Make sure you discuss any questions you have with your health care provider. Document Revised: 06/05/2022 Document Reviewed: 06/05/2022 Elsevier Patient Education  2024 [employer].

## 2023-08-06 NOTE — Telephone Encounter (Signed)
I spoke with pt's daughter, Nena Jordan. She reports her dad is feeling weak, nauseated, having dry heaves (even w/nausea meds), has bone pain (since injection). Afebrile. Port looks good. No redness, warmth, and drainage. Had BM this morning, (normal per wife). She had given him laxative. I told daughter to make sure he was taking 2 senna-S BID, esp with use of nausea meds. Pt is coming in @ 11a for nurse triage.

## 2023-08-06 NOTE — Progress Notes (Signed)
Patient reports having difficulty swallowing- States it has been going on since he had his white blood cell shot- No respiratory distress noted and patient is able to talk freely. Cecelia Byars notified.

## 2023-08-06 NOTE — Progress Notes (Signed)
Patient states that he "feels better" , Is able to walk to bathroom without assistance, ate 1/2 bag potato chips while in infusion.

## 2023-08-06 NOTE — Progress Notes (Signed)
CRITICAL VALUE:  WBC 0.6  RECEIVER (on-site recipient of call):  Dyane Dustman RN   DATE & TIME NOTIFIED:   08/06/2023 @ 1206  MESSENGER (representative from lab):  Morrie Sheldon [facility] lab  MD NOTIFIED:   Dr. Melvyn Neth  TIME OF NOTIFICATION:  1209  RESPONSE:  He is aware.

## 2023-08-15 NOTE — Progress Notes (Unsigned)
[facility]  [address] [place],  [place]  [address] [phone]  Clinic Day:  08/16/2023  Referring physician: Gordan Payment., MD   HISTORY OF PRESENT ILLNESS:  The patient is a 77 y.o. male with limited stage small cell lung cancer.  He comes in today to be evaluated before heading into his 4th and final cycle of cisplatin/etoposide, for which parts were given in conjunction with daily radiation.  Since his last visit, the patient has been doing okay.  However, his family is very concerned that they have noticed how he has gotten progressively weaker with each successive cycle of treatment.  He has also needed IV fluids over the past week.  As it pertains to his small cell lung cancer, the patient no longer has any shortness of breath since his chemoradiation commenced.   PHYSICAL EXAM:  Blood pressure 112/69, pulse 73, temperature 97.7 F (36.5 C), resp. rate 16, height 5\' 6"  (1.676 m), weight 191 lb 14.4 oz (87 kg), SpO2 93%. Wt Readings from Last 3 Encounters:  08/16/23 191 lb 14.4 oz (87 kg)  08/06/23 189 lb 8 oz (86 kg)  08/02/23 209 lb (94.8 kg)   Body mass index is 30.97 kg/m. Performance status (ECOG): 1 - Symptomatic but completely ambulatory Physical Exam Constitutional:      Appearance: Normal appearance. He is not ill-appearing.  HENT:     Mouth/Throat:     Mouth: Mucous membranes are moist.     Pharynx: Oropharynx is clear. No oropharyngeal exudate or posterior oropharyngeal erythema.  Cardiovascular:     Rate and Rhythm: Normal rate and regular rhythm.     Heart sounds: No murmur heard.    No friction rub. No gallop.  Pulmonary:     Effort: Pulmonary effort is normal. No respiratory distress.     Breath sounds: Normal breath sounds. No wheezing, rhonchi or rales.  Abdominal:     General: Bowel sounds are normal. There is no distension.     Palpations: Abdomen is soft. There is no mass.     Tenderness: There is no  abdominal tenderness.  Musculoskeletal:        General: No swelling.     Right lower leg: No edema.     Left lower leg: No edema.  Lymphadenopathy:     Cervical: No cervical adenopathy.     Upper Body:     Right upper body: No supraclavicular or axillary adenopathy.     Left upper body: No supraclavicular or axillary adenopathy.     Lower Body: No right inguinal adenopathy. No left inguinal adenopathy.  Skin:    General: Skin is warm.     Coloration: Skin is not jaundiced.     Findings: No lesion or rash.  Neurological:     General: No focal deficit present.     Mental Status: He is alert and oriented to person, place, and time. Mental status is at baseline.  Psychiatric:        Mood and Affect: Mood normal.        Behavior: Behavior normal.        Thought Content: Thought content normal.    LABS:    Latest Reference Range & Units 08/16/23 08:52  Sodium 135 - 145 mmol/L 135  Potassium 3.5 - 5.1 mmol/L 3.6  Chloride 98 - 111 mmol/L 99  CO2 22 - 32 mmol/L 28  Glucose 70 - 99 mg/dL 213 (H)  BUN 8 - 23  mg/dL 12  Creatinine 7.82 - 9.56 mg/dL 2.13  Calcium 8.9 - 08.6 mg/dL 9.0  Anion gap 5 - 15  8  Magnesium 1.7 - 2.4 mg/dL 1.6 (L)  Alkaline Phosphatase 38 - 126 U/L 98  Albumin 3.5 - 5.0 g/dL 3.5  AST 15 - 41 U/L 15  ALT 0 - 44 U/L 10  Total Protein 6.5 - 8.1 g/dL 6.9  Total Bilirubin 0.3 - 1.2 mg/dL 0.3  GFR, Est Non African American >60 mL/min >60  (H): Data is abnormally high (L): Data is abnormally low     Latest Ref Rng & Units 08/06/2023   12:00 AM 07/26/2023   12:00 AM 07/05/2023    9:31 AM  CBC  WBC  0.6     1.7     4.2   Hemoglobin 13.5 - 17.5 9.7     10.4     10.8   Hematocrit 41 - 53 28     30     33.6   Platelets 150 - 400 K/uL 88     106     266      This result is from an external source.      Latest Ref Rng & Units 08/16/2023    8:52 AM 08/06/2023   12:00 AM 07/26/2023    8:37 AM  CMP  Glucose 70 - 99 mg/dL 578   469   BUN 8 - 23 mg/dL 12  29     23     Creatinine 0.61 - 1.24 mg/dL 6.29  0.9     5.28   Sodium 135 - 145 mmol/L 135  128     135   Potassium 3.5 - 5.1 mmol/L 3.6  4.1     3.8   Chloride 98 - 111 mmol/L 99  96     99   CO2 22 - 32 mmol/L 28  28     28    Calcium 8.9 - 10.3 mg/dL 9.0  9.6     9.0   Total Protein 6.5 - 8.1 g/dL 6.9   6.9   Total Bilirubin 0.3 - 1.2 mg/dL 0.3   0.4   Alkaline Phos 38 - 126 U/L 98  104     99   AST 15 - 41 U/L 15  17     15    ALT 0 - 44 U/L 10  11     11       This result is from an external source.    ASSESSMENT & PLAN:  A 77 y.o. male who has limited stage small cell lung cancer.  He will proceed with his 4th and final cycle of cisplatin/etoposide early next week. Clinically, the patient appears to be doing okay.  I stressed to him the importance of letting us know if he needs any supportive measures over these next few weeks to ensure he gets through his final cycle of chemotherapy as best as possible.  Otherwise,  I will see him back in 1 month for repeat clinical assessment.  CT scans will be done a day before his next visit to ascertain his new disease baseline after the completion of his definitive chemoradiation.  The patient and his family understand all the plans discussed today and are in agreement with them.    Wynnie Pacetti Kirby Funk, MD

## 2023-08-16 ENCOUNTER — Inpatient Hospital Stay: Payer: Medicare HMO | Admitting: Oncology

## 2023-08-16 ENCOUNTER — Other Ambulatory Visit: Payer: Self-pay | Admitting: Oncology

## 2023-08-16 ENCOUNTER — Inpatient Hospital Stay: Payer: Medicare HMO

## 2023-08-16 DIAGNOSIS — C3412 Malignant neoplasm of upper lobe, left bronchus or lung: Secondary | ICD-10-CM

## 2023-08-16 DIAGNOSIS — Z5111 Encounter for antineoplastic chemotherapy: Secondary | ICD-10-CM | POA: Diagnosis not present

## 2023-08-16 LAB — CMP (CANCER CENTER ONLY)
ALT: 10 U/L (ref 0–44)
AST: 15 U/L (ref 15–41)
Albumin: 3.5 g/dL (ref 3.5–5.0)
Alkaline Phosphatase: 98 U/L (ref 38–126)
Anion gap: 8 (ref 5–15)
BUN: 12 mg/dL (ref 8–23)
CO2: 28 mmol/L (ref 22–32)
Calcium: 9 mg/dL (ref 8.9–10.3)
Chloride: 99 mmol/L (ref 98–111)
Creatinine: 0.84 mg/dL (ref 0.61–1.24)
GFR, Estimated: >60 mL/min (ref >60)
Glucose, Bld: 102 mg/dL — ABNORMAL HIGH (ref 70–99)
Potassium: 3.6 mmol/L (ref 3.5–5.1)
Sodium: 135 mmol/L (ref 135–145)
Total Bilirubin: 0.3 mg/dL (ref 0.3–1.2)
Total Protein: 6.9 g/dL (ref 6.5–8.1)

## 2023-08-16 LAB — CBC AND DIFFERENTIAL
HCT: 26 — AB (ref 41–53)
Hemoglobin: 8.9 — AB (ref 13.5–17.5)
Neutrophils Absolute: 7.39
Platelets: 115 10*3/uL — AB (ref 150–400)
WBC: 8.8

## 2023-08-16 LAB — MAGNESIUM: Magnesium: 1.6 mg/dL — ABNORMAL LOW (ref 1.7–2.4)

## 2023-08-16 LAB — CBC: RBC: 2.99 — AB (ref 3.87–5.11)

## 2023-08-17 ENCOUNTER — Other Ambulatory Visit: Payer: Self-pay

## 2023-08-17 MED FILL — Cisplatin Inj 100 MG/100ML (1 MG/ML): INTRAVENOUS | Qty: 150 | Status: AC

## 2023-08-17 MED FILL — Fosaprepitant Dimeglumine For IV Infusion 150 MG (Base Eq): INTRAVENOUS | Qty: 5 | Status: AC

## 2023-08-17 MED FILL — Dexamethasone Sodium Phosphate Inj 100 MG/10ML: INTRAMUSCULAR | Qty: 1 | Status: AC

## 2023-08-20 ENCOUNTER — Inpatient Hospital Stay: Payer: Medicare HMO

## 2023-08-20 VITALS — BP 121/70 | HR 67 | Temp 97.6°F | Resp 16 | Wt 192.0 lb

## 2023-08-20 DIAGNOSIS — Z5111 Encounter for antineoplastic chemotherapy: Secondary | ICD-10-CM | POA: Diagnosis not present

## 2023-08-20 DIAGNOSIS — C3412 Malignant neoplasm of upper lobe, left bronchus or lung: Secondary | ICD-10-CM

## 2023-08-20 MED ORDER — SODIUM CHLORIDE 0.9% FLUSH
10.0000 mL | INTRAVENOUS | Status: DC | PRN
  Administered 2023-08-20: 10 mL

## 2023-08-20 MED ORDER — POTASSIUM CHLORIDE IN NACL 20-0.9 MEQ/L-% IV SOLN
Freq: Once | INTRAVENOUS | Status: AC
  Filled 2023-08-20: qty 1000

## 2023-08-20 MED ORDER — PALONOSETRON HCL INJECTION 0.25 MG/5ML
0.2500 mg | Freq: Once | INTRAVENOUS | Status: AC
  Administered 2023-08-20: 0.25 mg via INTRAVENOUS
  Filled 2023-08-20: qty 5

## 2023-08-20 MED ORDER — SODIUM CHLORIDE 0.9 % IV SOLN
150.0000 mg | Freq: Once | INTRAVENOUS | Status: AC
  Administered 2023-08-20: 150 mg via INTRAVENOUS
  Filled 2023-08-20: qty 150

## 2023-08-20 MED ORDER — MAGNESIUM SULFATE 4 GM/100ML IV SOLN
4.0000 g | Freq: Once | INTRAVENOUS | Status: AC
  Administered 2023-08-20: 4 g via INTRAVENOUS
  Filled 2023-08-20: qty 100

## 2023-08-20 MED ORDER — SODIUM CHLORIDE 0.9 % IV SOLN
10.0000 mg | Freq: Once | INTRAVENOUS | Status: AC
  Administered 2023-08-20: 10 mg via INTRAVENOUS
  Filled 2023-08-20: qty 10

## 2023-08-20 MED ORDER — SODIUM CHLORIDE 0.9 % IV SOLN: Freq: Once | INTRAVENOUS | Status: AC

## 2023-08-20 MED ORDER — SODIUM CHLORIDE 0.9 % IV SOLN
100.0000 mg/m2 | Freq: Once | INTRAVENOUS | Status: AC
  Administered 2023-08-20: 200 mg via INTRAVENOUS
  Filled 2023-08-20: qty 10

## 2023-08-20 MED ORDER — HEPARIN SOD (PORK) LOCK FLUSH 100 UNIT/ML IV SOLN
500.0000 [IU] | Freq: Once | INTRAVENOUS | Status: AC | PRN
  Administered 2023-08-20: 500 [IU]

## 2023-08-20 MED ORDER — SODIUM CHLORIDE 0.9 % IV SOLN
80.0000 mg/m2 | Freq: Once | INTRAVENOUS | Status: AC
  Administered 2023-08-20: 150 mg via INTRAVENOUS
  Filled 2023-08-20: qty 100

## 2023-08-20 MED FILL — Etoposide Inj 1 GM/50ML (20 MG/ML): INTRAVENOUS | Qty: 10 | Status: AC

## 2023-08-20 MED FILL — Dexamethasone Sodium Phosphate Inj 100 MG/10ML: INTRAMUSCULAR | Qty: 1 | Status: AC

## 2023-08-20 NOTE — Patient Instructions (Signed)
Etoposide Injection What is this medication? ETOPOSIDE (e toe POE side) treats some types of cancer. It works by slowing down the growth of cancer cells. This medicine may be used for other purposes; ask your health care provider or pharmacist if you have questions. COMMON BRAND NAME(S): Etopophos, Toposar, VePesid What should I tell my care team before I take this medication? They need to know if you have any of these conditions: Infection Kidney disease Liver disease Low blood counts, such as low white cell, platelet, red cell counts An unusual or allergic reaction to etoposide, other medications, foods, dyes, or preservatives If you or your partner are pregnant or trying to get pregnant Breastfeeding How should I use this medication? This medication is injected into a vein. It is given by your care team in a hospital or clinic setting. Talk to your care team about the use of this medication in children. Special care may be needed. Overdosage: If you think you have taken too much of this medicine contact a poison control center or emergency room at once. NOTE: This medicine is only for you. Do not share this medicine with others. What if I miss a dose? Keep appointments for follow-up doses. It is important not to miss your dose. Call your care team if you are unable to keep an appointment. What may interact with this medication? Warfarin This list may not describe all possible interactions. Give your health care provider a list of all the medicines, herbs, non-prescription drugs, or dietary supplements you use. Also tell them if you smoke, drink alcohol, or use illegal drugs. Some items may interact with your medicine. What should I watch for while using this medication? Your condition will be monitored carefully while you are receiving this medication. This medication may make you feel generally unwell. This is not uncommon as chemotherapy can affect healthy cells as well as cancer  cells. Report any side effects. Continue your course of treatment even though you feel ill unless your care team tells you to stop. This medication can cause serious side effects. To reduce the risk, your care team may give you other medications to take before receiving this one. Be sure to follow the directions from your care team. This medication may increase your risk of getting an infection. Call your care team for advice if you get a fever, chills, sore throat, or other symptoms of a cold or flu. Do not treat yourself. Try to avoid being around people who are sick. This medication may increase your risk to bruise or bleed. Call your care team if you notice any unusual bleeding. Talk to your care team about your risk of cancer. You may be more at risk for certain types of cancers if you take this medication. Talk to your care team if you may be pregnant. Serious birth defects can occur if you take this medication during pregnancy and for 6 months after the last dose. You will need a negative pregnancy test before starting this medication. Contraception is recommended while taking this medication and for 6 months after the last dose. Your care team can help you find the option that works for you. If your partner can get pregnant, use a condom during sex while taking this medication and for 4 months after the last dose. Do not breastfeed while taking this medication. This medication may cause infertility. Talk to your care team if you are concerned about your fertility. What side effects may I notice from receiving this medication?  Side effects that you should report to your care team as soon as possible: Allergic reactions--skin rash, itching, hives, swelling of the face, lips, tongue, or throat Infection--fever, chills, cough, sore throat, wounds that don't heal, pain or trouble when passing urine, general feeling of discomfort or being unwell Low red blood cell level--unusual weakness or fatigue,  dizziness, headache, trouble breathing Unusual bruising or bleeding Side effects that usually do not require medical attention (report to your care team if they continue or are bothersome): Diarrhea Fatigue Hair loss Loss of appetite Nausea Vomiting This list may not describe all possible side effects. Call your doctor for medical advice about side effects. You may report side effects to FDA at 1-800-FDA-1088. Where should I keep my medication? This medication is given in a hospital or clinic. It will not be stored at home. NOTE: This sheet is a summary. It may not cover all possible information. If you have questions about this medicine, talk to your doctor, pharmacist, or health care provider.  2024 Elsevier/Gold Standard (2022-03-30 00:00:00)  Cisplatin Injection What is this medication? CISPLATIN (SIS pla tin) treats some types of cancer. It works by slowing down the growth of cancer cells. This medicine may be used for other purposes; ask your health care provider or pharmacist if you have questions. COMMON BRAND NAME(S): Platinol, Platinol -AQ What should I tell my care team before I take this medication? They need to know if you have any of these conditions: Eye disease, vision problems Hearing problems Kidney disease Low blood counts, such as low white cells, platelets, or red blood cells Tingling of the fingers or toes, or other nerve disorder An unusual or allergic reaction to cisplatin, carboplatin, oxaliplatin, other medications, foods, dyes, or preservatives If you or your partner are pregnant or trying to get pregnant Breast-feeding How should I use this medication? This medication is injected into a vein. It is given by your care team in a hospital or clinic setting. Talk to your care team about the use of this medication in children. Special care may be needed. Overdosage: If you think you have taken too much of this medicine contact a poison control center or  emergency room at once. NOTE: This medicine is only for you. Do not share this medicine with others. What if I miss a dose? Keep appointments for follow-up doses. It is important not to miss your dose. Call your care team if you are unable to keep an appointment. What may interact with this medication? Do not take this medication with any of the following: Live virus vaccines This medication may also interact with the following: Certain antibiotics, such as amikacin, gentamicin, neomycin, polymyxin B, streptomycin, tobramycin, vancomycin Foscarnet This list may not describe all possible interactions. Give your health care provider a list of all the medicines, herbs, non-prescription drugs, or dietary supplements you use. Also tell them if you smoke, drink alcohol, or use illegal drugs. Some items may interact with your medicine. What should I watch for while using this medication? Your condition will be monitored carefully while you are receiving this medication. You may need blood work done while taking this medication. This medication may make you feel generally unwell. This is not uncommon, as chemotherapy can affect healthy cells as well as cancer cells. Report any side effects. Continue your course of treatment even though you feel ill unless your care team tells you to stop. This medication may increase your risk of getting an infection. Call your care team  for advice if you get a fever, chills, sore throat, or other symptoms of a cold or flu. Do not treat yourself. Try to avoid being around people who are sick. Avoid taking medications that contain aspirin, acetaminophen, ibuprofen, naproxen, or ketoprofen unless instructed by your care team. These medications may hide a fever. This medication may increase your risk to bruise or bleed. Call your care team if you notice any unusual bleeding. Be careful brushing or flossing your teeth or using a toothpick because you may get an infection or  bleed more easily. If you have any dental work done, tell your dentist you are receiving this medication. Drink fluids as directed while you are taking this medication. This will help protect your kidneys. Call your care team if you get diarrhea. Do not treat yourself. Talk to your care team if you or your partner wish to become pregnant or think you might be pregnant. This medication can cause serious birth defects if taken during pregnancy and for 14 months after the last dose. A negative pregnancy test is required before starting this medication. A reliable form of contraception is recommended while taking this medication and for 14 months after the last dose. Talk to your care team about effective forms of contraception. Do not father a child while taking this medication and for 11 months after the last dose. Use a condom during sex during this time period. Do not breast-feed while taking this medication. This medication may cause infertility. Talk to your care team if you are concerned about your fertility. What side effects may I notice from receiving this medication? Side effects that you should report to your care team as soon as possible: Allergic reactions--skin rash, itching, hives, swelling of the face, lips, tongue, or throat Eye pain, change in vision, vision loss Hearing loss, ringing in ears Infection--fever, chills, cough, sore throat, wounds that don't heal, pain or trouble when passing urine, general feeling of discomfort or being unwell Kidney injury--decrease in the amount of urine, swelling of the ankles, hands, or feet Low red blood cell level--unusual weakness or fatigue, dizziness, headache, trouble breathing Painful swelling, warmth, or redness of the skin, blisters or sores at the infusion site Pain, tingling, or numbness in the hands or feet Unusual bruising or bleeding Side effects that usually do not require medical attention (report to your care team if they continue  or are bothersome): Hair loss Nausea Vomiting This list may not describe all possible side effects. Call your doctor for medical advice about side effects. You may report side effects to FDA at 1-800-FDA-1088. Where should I keep my medication? This medication is given in a hospital or clinic. It will not be stored at home. NOTE: This sheet is a summary. It may not cover all possible information. If you have questions about this medicine, talk to your doctor, pharmacist, or health care provider.  2024 Elsevier/Gold Standard (2022-03-10 00:00:00)

## 2023-08-21 ENCOUNTER — Inpatient Hospital Stay: Payer: Medicare HMO | Attending: Oncology

## 2023-08-21 VITALS — BP 150/75 | HR 84 | Temp 98.1°F | Resp 18 | Ht 66.0 in | Wt 197.2 lb

## 2023-08-21 DIAGNOSIS — C3412 Malignant neoplasm of upper lobe, left bronchus or lung: Secondary | ICD-10-CM | POA: Insufficient documentation

## 2023-08-21 DIAGNOSIS — Z5111 Encounter for antineoplastic chemotherapy: Secondary | ICD-10-CM | POA: Insufficient documentation

## 2023-08-21 DIAGNOSIS — Z5189 Encounter for other specified aftercare: Secondary | ICD-10-CM | POA: Insufficient documentation

## 2023-08-21 DIAGNOSIS — Z79899 Other long term (current) drug therapy: Secondary | ICD-10-CM | POA: Diagnosis not present

## 2023-08-21 MED ORDER — SODIUM CHLORIDE 0.9 % IV SOLN: Freq: Once | INTRAVENOUS | Status: AC

## 2023-08-21 MED ORDER — SODIUM CHLORIDE 0.9 % IV SOLN
10.0000 mg | Freq: Once | INTRAVENOUS | Status: AC
  Administered 2023-08-21: 10 mg via INTRAVENOUS
  Filled 2023-08-21: qty 10

## 2023-08-21 MED ORDER — SODIUM CHLORIDE 0.9 % IV SOLN
100.0000 mg/m2 | Freq: Once | INTRAVENOUS | Status: AC
  Administered 2023-08-21: 200 mg via INTRAVENOUS
  Filled 2023-08-21: qty 10

## 2023-08-21 MED ORDER — SODIUM CHLORIDE 0.9% FLUSH
10.0000 mL | INTRAVENOUS | Status: DC | PRN
  Administered 2023-08-21: 10 mL

## 2023-08-21 MED ORDER — HEPARIN SOD (PORK) LOCK FLUSH 100 UNIT/ML IV SOLN
500.0000 [IU] | Freq: Once | INTRAVENOUS | Status: AC | PRN
  Administered 2023-08-21: 500 [IU]

## 2023-08-21 MED FILL — Etoposide Inj 1 GM/50ML (20 MG/ML): INTRAVENOUS | Qty: 10 | Status: AC

## 2023-08-21 MED FILL — Dexamethasone Sodium Phosphate Inj 100 MG/10ML: INTRAMUSCULAR | Qty: 1 | Status: AC

## 2023-08-21 NOTE — Patient Instructions (Signed)
[facility]  Discharge Instructions: Thank you for choosing [facility] to provide your oncology and hematology care.  If you have a lab appointment with the [facility], please go directly to the [facility] and check in at the registration area.   Wear comfortable clothing and clothing appropriate for easy access to any Portacath or PICC line.   We strive to give you quality time with your provider. You may need to reschedule your appointment if you arrive late (15 or more minutes).  Arriving late affects you and other patients whose appointments are after yours.  Also, if you miss three or more appointments without notifying the office, you may be dismissed from the clinic at the provider's discretion.      For prescription refill requests, have your pharmacy contact our office and allow 72 hours for refills to be completed.    Today you received the following chemotherapy and/or immunotherapy agents Etoposide      To help prevent nausea and vomiting after your treatment, we encourage you to take your nausea medication as directed.  BELOW ARE SYMPTOMS THAT SHOULD BE REPORTED IMMEDIATELY: *FEVER GREATER THAN 100.4 F (38 C) OR HIGHER *CHILLS OR SWEATING *NAUSEA AND VOMITING THAT IS NOT CONTROLLED WITH YOUR NAUSEA MEDICATION *UNUSUAL SHORTNESS OF BREATH *UNUSUAL BRUISING OR BLEEDING *URINARY PROBLEMS (pain or burning when urinating, or frequent urination) *BOWEL PROBLEMS (unusual diarrhea, constipation, pain near the anus) TENDERNESS IN MOUTH AND THROAT WITH OR WITHOUT PRESENCE OF ULCERS (sore throat, sores in mouth, or a toothache) UNUSUAL RASH, SWELLING OR PAIN  UNUSUAL VAGINAL DISCHARGE OR ITCHING   Items with * indicate a potential emergency and should be followed up as soon as possible or go to the Emergency Department if any problems should occur.  Please show the CHEMOTHERAPY ALERT CARD or IMMUNOTHERAPY ALERT CARD at check-in to the  Emergency Department and triage nurse.  Should you have questions after your visit or need to cancel or reschedule your appointment, please contact [facility]  Dept: [phone]  and follow the prompts.  Office hours are 8:00 a.m. to 4:30 p.m. Monday - Friday. Please note that voicemails left after 4:00 p.m. may not be returned until the following business day.  We are closed weekends and major holidays. You have access to a nurse at all times for urgent questions. Please call the main number to the clinic Dept: [phone] and follow the prompts.  For any non-urgent questions, you may also contact your provider using MyChart. We now offer e-Visits for anyone 51 and older to request care online for non-urgent symptoms. For details visit [email or web address].   Also download the MyChart app! Go to the app store, search "MyChart", open the app, select [facility], and log in with your MyChart username and password.

## 2023-08-22 ENCOUNTER — Inpatient Hospital Stay: Payer: Medicare HMO

## 2023-08-22 VITALS — BP 130/73 | HR 86 | Temp 97.6°F | Resp 18 | Ht 66.0 in | Wt 199.0 lb

## 2023-08-22 DIAGNOSIS — C3412 Malignant neoplasm of upper lobe, left bronchus or lung: Secondary | ICD-10-CM

## 2023-08-22 DIAGNOSIS — Z5111 Encounter for antineoplastic chemotherapy: Secondary | ICD-10-CM | POA: Diagnosis not present

## 2023-08-22 MED ORDER — SODIUM CHLORIDE 0.9 % IV SOLN
10.0000 mg | Freq: Once | INTRAVENOUS | Status: AC
  Administered 2023-08-22: 10 mg via INTRAVENOUS
  Filled 2023-08-22: qty 10

## 2023-08-22 MED ORDER — SODIUM CHLORIDE 0.9 % IV SOLN: Freq: Once | INTRAVENOUS | Status: AC

## 2023-08-22 MED ORDER — SODIUM CHLORIDE 0.9 % IV SOLN
100.0000 mg/m2 | Freq: Once | INTRAVENOUS | Status: AC
  Administered 2023-08-22: 200 mg via INTRAVENOUS
  Filled 2023-08-22: qty 10

## 2023-08-22 MED ORDER — HEPARIN SOD (PORK) LOCK FLUSH 100 UNIT/ML IV SOLN
500.0000 [IU] | Freq: Once | INTRAVENOUS | Status: AC | PRN
  Administered 2023-08-22: 500 [IU]

## 2023-08-22 MED ORDER — SODIUM CHLORIDE 0.9% FLUSH
10.0000 mL | INTRAVENOUS | Status: DC | PRN
  Administered 2023-08-22: 10 mL

## 2023-08-22 NOTE — Patient Instructions (Signed)
[facility]  Discharge Instructions: Thank you for choosing [facility] to provide your oncology and hematology care.  If you have a lab appointment with the [facility], please go directly to the [facility] and check in at the registration area.   Wear comfortable clothing and clothing appropriate for easy access to any Portacath or PICC line.   We strive to give you quality time with your provider. You may need to reschedule your appointment if you arrive late (15 or more minutes).  Arriving late affects you and other patients whose appointments are after yours.  Also, if you miss three or more appointments without notifying the office, you may be dismissed from the clinic at the provider's discretion.      For prescription refill requests, have your pharmacy contact our office and allow 72 hours for refills to be completed.    Today you received the following chemotherapy and/or immunotherapy agents Etoposide      To help prevent nausea and vomiting after your treatment, we encourage you to take your nausea medication as directed.  BELOW ARE SYMPTOMS THAT SHOULD BE REPORTED IMMEDIATELY: *FEVER GREATER THAN 100.4 F (38 C) OR HIGHER *CHILLS OR SWEATING *NAUSEA AND VOMITING THAT IS NOT CONTROLLED WITH YOUR NAUSEA MEDICATION *UNUSUAL SHORTNESS OF BREATH *UNUSUAL BRUISING OR BLEEDING *URINARY PROBLEMS (pain or burning when urinating, or frequent urination) *BOWEL PROBLEMS (unusual diarrhea, constipation, pain near the anus) TENDERNESS IN MOUTH AND THROAT WITH OR WITHOUT PRESENCE OF ULCERS (sore throat, sores in mouth, or a toothache) UNUSUAL RASH, SWELLING OR PAIN  UNUSUAL VAGINAL DISCHARGE OR ITCHING   Items with * indicate a potential emergency and should be followed up as soon as possible or go to the Emergency Department if any problems should occur.  Please show the CHEMOTHERAPY ALERT CARD or IMMUNOTHERAPY ALERT CARD at check-in to the  Emergency Department and triage nurse.  Should you have questions after your visit or need to cancel or reschedule your appointment, please contact [facility]  Dept: [phone]  and follow the prompts.  Office hours are 8:00 a.m. to 4:30 p.m. Monday - Friday. Please note that voicemails left after 4:00 p.m. may not be returned until the following business day.  We are closed weekends and major holidays. You have access to a nurse at all times for urgent questions. Please call the main number to the clinic Dept: [phone] and follow the prompts.  For any non-urgent questions, you may also contact your provider using MyChart. We now offer e-Visits for anyone 51 and older to request care online for non-urgent symptoms. For details visit [email or web address].   Also download the MyChart app! Go to the app store, search "MyChart", open the app, select [facility], and log in with your MyChart username and password.

## 2023-08-23 ENCOUNTER — Inpatient Hospital Stay: Payer: Medicare HMO

## 2023-08-23 VITALS — BP 123/69 | HR 60 | Temp 97.5°F | Resp 20 | Ht 66.0 in | Wt 199.2 lb

## 2023-08-23 DIAGNOSIS — Z5111 Encounter for antineoplastic chemotherapy: Secondary | ICD-10-CM | POA: Diagnosis not present

## 2023-08-23 DIAGNOSIS — C3412 Malignant neoplasm of upper lobe, left bronchus or lung: Secondary | ICD-10-CM

## 2023-08-23 MED ORDER — MAGNESIUM SULFATE 2 GM/50ML IV SOLN
2.0000 g | Freq: Once | INTRAVENOUS | Status: AC
  Administered 2023-08-23: 2 g via INTRAVENOUS
  Filled 2023-08-23: qty 50

## 2023-08-23 MED ORDER — PEGFILGRASTIM-CBQV 6 MG/0.6ML ~~LOC~~ SOSY
6.0000 mg | PREFILLED_SYRINGE | Freq: Once | SUBCUTANEOUS | Status: AC
  Administered 2023-08-23: 6 mg via SUBCUTANEOUS
  Filled 2023-08-23: qty 0.6

## 2023-08-23 MED ORDER — SODIUM CHLORIDE 0.9 % IV SOLN: Freq: Once | INTRAVENOUS | Status: AC

## 2023-08-23 NOTE — Patient Instructions (Signed)
Magnesium Sulfate Injection What is this medication? MAGNESIUM SULFATE (mag NEE zee um SUL fate) prevents and treats low levels of magnesium in your body. It may also be used to prevent and treat seizures during pregnancy in people with high blood pressure disorders, such as preeclampsia or eclampsia. Magnesium plays an important role in maintaining the health of your muscles and nervous system. This medicine may be used for other purposes; ask your health care provider or pharmacist if you have questions. What should I tell my care team before I take this medication? They need to know if you have any of these conditions: Heart disease History of irregular heart beat Kidney disease An unusual or allergic reaction to magnesium sulfate, medications, foods, dyes, or preservatives Pregnant or trying to get pregnant Breast-feeding How should I use this medication? This medication is for infusion into a vein. It is given in a hospital or clinic setting. Talk to your care team about the use of this medication in children. While this medication may be prescribed for selected conditions, precautions do apply. Overdosage: If you think you have taken too much of this medicine contact a poison control center or emergency room at once. NOTE: This medicine is only for you. Do not share this medicine with others. What if I miss a dose? This does not apply. What may interact with this medication? Certain medications for anxiety or sleep Certain medications for seizures, such phenobarbital Digoxin Medications that relax muscles for surgery Narcotic medications for pain This list may not describe all possible interactions. Give your health care provider a list of all the medicines, herbs, non-prescription drugs, or dietary supplements you use. Also tell them if you smoke, drink alcohol, or use illegal drugs. Some items may interact with your medicine. What should I watch for while using this  medication? Your condition will be monitored carefully while you are receiving this medication. You may need blood work done while you are receiving this medication. What side effects may I notice from receiving this medication? Side effects that you should report to your care team as soon as possible: Allergic reactions--skin rash, itching, hives, swelling of the face, lips, tongue, or throat High magnesium level--confusion, drowsiness, facial flushing, redness, sweating, muscle weakness, fast or irregular heartbeat, trouble breathing Low blood pressure--dizziness, feeling faint or lightheaded, blurry vision Side effects that usually do not require medical attention (report to your care team if they continue or are bothersome): Headache Nausea This list may not describe all possible side effects. Call your doctor for medical advice about side effects. You may report side effects to FDA at 1-800-FDA-1088. Where should I keep my medication? This medication is given in a hospital or clinic and will not be stored at home. NOTE: This sheet is a summary. It may not cover all possible information. If you have questions about this medicine, talk to your doctor, pharmacist, or health care provider.  2024 Elsevier/Gold Standard (2021-07-20 00:00:00)  

## 2023-08-28 ENCOUNTER — Inpatient Hospital Stay: Payer: Medicare HMO

## 2023-08-28 ENCOUNTER — Encounter: Payer: Self-pay | Admitting: Oncology

## 2023-08-28 VITALS — BP 105/56 | HR 73 | Temp 98.1°F | Resp 18

## 2023-08-28 DIAGNOSIS — Z5111 Encounter for antineoplastic chemotherapy: Secondary | ICD-10-CM | POA: Diagnosis not present

## 2023-08-28 DIAGNOSIS — C3412 Malignant neoplasm of upper lobe, left bronchus or lung: Secondary | ICD-10-CM

## 2023-08-28 LAB — BASIC METABOLIC PANEL
BUN: 21 (ref 4–21)
CO2: 26 — AB (ref 13–22)
Chloride: 96 — AB (ref 99–108)
Creatinine: 0.8 (ref 0.6–1.3)
Glucose: 113
Potassium: 4 meq/L (ref 3.5–5.1)
Sodium: 128 — AB (ref 137–147)

## 2023-08-28 LAB — HEPATIC FUNCTION PANEL
ALT: 10 U/L (ref 10–40)
AST: 18 (ref 14–40)
Alkaline Phosphatase: 112 (ref 25–125)
Bilirubin, Total: 1.4

## 2023-08-28 LAB — COMPREHENSIVE METABOLIC PANEL
Albumin: 3.7 (ref 3.5–5.0)
Calcium: 8.9 (ref 8.7–10.7)

## 2023-08-28 LAB — CBC AND DIFFERENTIAL
HCT: 22 — AB (ref 41–53)
Hemoglobin: 7.8 — AB (ref 13.5–17.5)
Neutrophils Absolute: 0.11
Platelets: 78 10*3/uL — AB (ref 150–400)
WBC: 0.4

## 2023-08-28 LAB — CBC: RBC: 2.55 — AB (ref 3.87–5.11)

## 2023-08-28 LAB — MAGNESIUM: Magnesium: 1.7

## 2023-08-28 MED ORDER — SODIUM CHLORIDE 0.9 % IV SOLN: INTRAVENOUS | Status: DC

## 2023-08-28 NOTE — Progress Notes (Signed)
Pt states he feels better.  Pt d/c home with specimen hat, specimen cup, gloves and biohazard bag for stool collection.  Instructions reviewed with with.  Both verbalize understanding.  Pt taken out in wheelchair.

## 2023-08-28 NOTE — Progress Notes (Unsigned)
CRITICAL VALUE STICKER  CRITICAL VALUE:  WBC 0.04; Hgb 7.9  RECEIVER (on-site recipient of call):  Dyane Dustman RN  DATE & TIME NOTIFIED:   08/28/2023 @ 1055  MESSENGER (representative from lab):  Marcelino Duster [facility] lab  MD NOTIFIED:   Dr. Melvyn Neth  TIME OF NOTIFICATION:  1120  RESPONSE:  Recheck labs on Friday.  Chemo completed.

## 2023-08-28 NOTE — Patient Instructions (Signed)
IV Infusion Therapy IV infusion therapy is a type of treatment to deliver a liquid substance (infusion) directly into a vein through a small, thin tube (catheter). You may have IV infusion therapy to receive an infusion of: Fluids. Medicines. Nutrition. Chemotherapy. This is the use of medicines to stop or slow the growth of cancer cells. Blood or blood products. X-ray dye that is given before an imaging procedure, such as an MRI or a CT scan. Tell a health care provider about: Any allergies you have. All medicines you are taking, including vitamins, herbs, eye drops, creams, and over-the-counter medicines. Any problems you or family members have had with anesthetic medicines or X-ray dyes. Any blood disorders you have. Any surgeries you have had, including an axillary lymph node dissection and an arteriovenous fistula for dialysis. Any medical conditions you have. Whether you are pregnant or may be pregnant. Any history of IV drug use. Any history of health care providers not being able to find a vein for an IV or blood draw. What are the risks? Generally, this is a safe procedure. However, problems may occur, including: Pain or bruising. Bleeding. Infection. Failure to place the catheter due to inability to find a vein. Leaking or blockage of the catheter (infiltration). Damage to blood vessels or nerves. Allergic reactions to medicines or dyes. A blood clot. What happens before the procedure? Follow instructions from your health care provider about eating or drinking restrictions. Ask your health care provider about changing or stopping your regular medicines. This is especially important if you are taking diabetes medicines or blood thinners. Learn as much as you can about your treatment. Ask your health care provider for reliable resources, such as websites, books, videos, and people, to help you learn about the treatment you will be having. What happens during the procedure?      Placing the catheter IV infusion therapy starts with a procedure to place a catheter into a vein. An IV tube will be attached to the catheter to allow the infusion to flow into your bloodstream. Your catheter may be placed: Into a vein that is usually in the bend of the elbow, in the forearm, or in the back of the hand (peripheral IV catheter). This type of catheter may need to be inserted into a vein each time you get an infusion. Into a vein near your elbow (midline catheter or PICC). This type of catheter may stay in place for weeks or months at a time so you can receive repeated infusions through it. Into a vein near your neck that leads to your heart (non-tunneled catheter). This type of catheter is only used for short amounts of time because it can cause infection. Through the skin of your chest and into a large vein that leads to your heart (tunneled catheter). This type of catheter may stay in your body for months or years. So that it connects to an implanted port. An implanted port is a device that is surgically inserted under the skin of the chest to provide long-term IV access. The catheter will connect the port to a large vein in the chest or upper arm. A port may be kept in place for many months or years. Each time you have an infusion, a needle will be inserted through your skin to connect the catheter to the port. After your catheter is placed, your health care team will lower your risk of infection by washing the skin at the IV site with a germ-killing (antiseptic) solution.  Doing the infusion To start the infusion, your health care provider will: Attach the IV tubing to your catheter. Use a tape or an adhesive bandage (dressing) to hold the catheter and tubing in place against your skin. Use an IV pump to control the flow of the IV infusion, if needed. During the infusion, your health care provider will check the area to make sure: There is no swelling or pain. Your IV infusion  is flowing through the catheter properly. After the infusion, your health care provider will: Remove the dressing or tape. Disconnect the tubing from the catheter. Remove the catheter, if you have a peripheral IV. Apply pressure over the IV insertion site to stop bleeding, then cover the area with a dressing. If you have an implanted port, PICC, non-tunneled, or tunneled catheter, your health care team may leave the catheter in place. This depends on your treatment, your medical condition, and what type of catheter you have. The procedure may vary among health care providers and hospitals. What can I expect after the procedure? Your blood pressure, heart rate, breathing rate, and blood oxygen level may be monitored until you leave the hospital or clinic. Follow these instructions at home: Take over-the-counter and prescription medicines only as told by your health care provider. Change or remove any dressings only as told by your health care provider. Return to your normal activities as told by your health care provider. Ask your health care provider what activities are safe for you. Do not take baths, swim, or use a hot tub until your health care provider approves. Ask your health care provider if you may take showers. Check your IV insertion site every day for signs of infection. Check for: Redness, swelling, or pain. Fluid or blood. If fluid or blood drains from your IV site, use your hands to press down firmly on the area for a minute or two. Doing this should stop the bleeding. Warmth. Pus or a bad smell. Keep all follow-up visits. This is important. Contact a health care provider if: You have signs of infection around your IV site. You have a fever or chills. You have fluid or blood coming from your IV site that does not stop after you apply pressure to the site. You have itchy skin. You have a rash or blisters. You have itchy, red, swollen areas of skin (hives). Get help right away  if: You have trouble breathing. You have chest pain. These symptoms may represent a serious problem that is an emergency. Do not wait to see if the symptoms will go away. Get medical help right away. Call your local emergency services (911 in the [place] Do not drive yourself to the hospital. Summary IV infusion therapy is a type of treatment to deliver a liquid substance (infusion) directly into a vein. Check your IV insertion site every day for signs of infection, such as redness or swelling. Change or remove bandages (dressings) only as told by your health care provider. Call your health care provider if you notice any signs of infection around your IV site or have a rash or blisters. This information is not intended to replace advice given to you by your health care provider. Make sure you discuss any questions you have with your health care provider. Document Revised: 06/18/2020 Document Reviewed: 06/19/2020 Elsevier Patient Education  2024 [employer].

## 2023-08-31 ENCOUNTER — Inpatient Hospital Stay: Payer: Medicare HMO

## 2023-08-31 DIAGNOSIS — C3412 Malignant neoplasm of upper lobe, left bronchus or lung: Secondary | ICD-10-CM

## 2023-08-31 DIAGNOSIS — Z5111 Encounter for antineoplastic chemotherapy: Secondary | ICD-10-CM | POA: Diagnosis not present

## 2023-08-31 LAB — COMPREHENSIVE METABOLIC PANEL
ALT: 11 U/L (ref 0–44)
AST: 14 U/L — ABNORMAL LOW (ref 15–41)
Albumin: 3.2 g/dL — ABNORMAL LOW (ref 3.5–5.0)
Alkaline Phosphatase: 91 U/L (ref 38–126)
Anion gap: 8 (ref 5–15)
BUN: 15 mg/dL (ref 8–23)
CO2: 26 mmol/L (ref 22–32)
Calcium: 9 mg/dL (ref 8.9–10.3)
Chloride: 98 mmol/L (ref 98–111)
Creatinine, Ser: 0.83 mg/dL (ref 0.61–1.24)
GFR, Estimated: >60 mL/min (ref >60)
Glucose, Bld: 110 mg/dL — ABNORMAL HIGH (ref 70–99)
Potassium: 3.6 mmol/L (ref 3.5–5.1)
Sodium: 132 mmol/L — ABNORMAL LOW (ref 135–145)
Total Bilirubin: 0.6 mg/dL (ref 0.3–1.2)
Total Protein: 6.4 g/dL — ABNORMAL LOW (ref 6.5–8.1)

## 2023-08-31 LAB — SAMPLE TO BLOOD BANK

## 2023-08-31 LAB — MAGNESIUM: Magnesium: 1.8 mg/dL (ref 1.7–2.4)

## 2023-09-03 ENCOUNTER — Inpatient Hospital Stay: Payer: Medicare HMO

## 2023-09-03 ENCOUNTER — Telehealth: Payer: Self-pay

## 2023-09-03 NOTE — Telephone Encounter (Signed)
WIFE CALLED AND CANCELLED THE APPT FOR TODAY. THE PATIENT DID NOT NEED ANY IV FLUIDS. THE PATIENT WAS DOING FINE. HE HAS BEEN DRINKING PLENTY OF FLUIDS AS WELL.

## 2023-09-10 ENCOUNTER — Telehealth: Payer: Self-pay | Admitting: Oncology

## 2023-09-10 NOTE — Telephone Encounter (Signed)
CT C/A/P has been scheduled for 09/24/23 @ 11:30; Checking in @ 10:30   Notified pt of date,time and instructions.

## 2023-09-20 ENCOUNTER — Other Ambulatory Visit: Payer: Self-pay

## 2023-09-25 ENCOUNTER — Ambulatory Visit: Payer: Medicare HMO | Admitting: Oncology

## 2023-09-26 LAB — LAB REPORT - SCANNED

## 2023-10-01 NOTE — Progress Notes (Signed)
[facility]  [address] [place],  [place]  [address] [phone]  Clinic Day:  10/02/2023  Referring physician: Gordan Payment., MD   HISTORY OF PRESENT ILLNESS:  The patient is a 77 y.o. male with limited stage small cell lung cancer.  He comes in today to to go over his CT scans to ascertain his new disease baseline after completing all of his definitive chemoradiation for his disease.  His chemotherapy consisted of 4 cycles of cisplatin/etoposide, for which parts were given in conjunction with daily radiation.  Since his last visit, the patient has been doing much better.  He essentially feels back to his baseline.  He denies having shortness of breath, hemoptysis, or other respiratory symptoms that concern him for an early return of his small cell lung cancer.  PHYSICAL EXAM:  Blood pressure (!) 121/53, pulse 77, temperature 97.7 F (36.5 C), resp. rate 18, height 5\' 6"  (1.676 m), weight 194 lb 8 oz (88.2 kg), SpO2 97%. Wt Readings from Last 3 Encounters:  10/02/23 194 lb 8 oz (88.2 kg)  08/23/23 199 lb 4 oz (90.4 kg)  08/22/23 199 lb 0.6 oz (90.3 kg)   Body mass index is 31.39 kg/m. Performance status (ECOG): 1 - Symptomatic but completely ambulatory Physical Exam Constitutional:      Appearance: Normal appearance. He is not ill-appearing.  HENT:     Mouth/Throat:     Mouth: Mucous membranes are moist.     Pharynx: Oropharynx is clear. No oropharyngeal exudate or posterior oropharyngeal erythema.  Cardiovascular:     Rate and Rhythm: Normal rate and regular rhythm.     Heart sounds: No murmur heard.    No friction rub. No gallop.  Pulmonary:     Effort: Pulmonary effort is normal. No respiratory distress.     Breath sounds: Normal breath sounds. No wheezing, rhonchi or rales.  Abdominal:     General: Bowel sounds are normal. There is no distension.     Palpations: Abdomen is soft. There is no mass.     Tenderness: There is no  abdominal tenderness.  Musculoskeletal:        General: No swelling.     Right lower leg: No edema.     Left lower leg: No edema.  Lymphadenopathy:     Cervical: No cervical adenopathy.     Upper Body:     Right upper body: No supraclavicular or axillary adenopathy.     Left upper body: No supraclavicular or axillary adenopathy.     Lower Body: No right inguinal adenopathy. No left inguinal adenopathy.  Skin:    General: Skin is warm.     Coloration: Skin is not jaundiced.     Findings: No lesion or rash.  Neurological:     General: No focal deficit present.     Mental Status: He is alert and oriented to person, place, and time. Mental status is at baseline.  Psychiatric:        Mood and Affect: Mood normal.        Behavior: Behavior normal.        Thought Content: Thought content normal.   SCANS: His chest CT revealed the following: FINDINGS: Interval decrease in size of left upper lobe mass, now measuring 1.4 x 1 cm in cross-section compared to a previous measurement of 2.2 x 2.1 cm. Multiple new left upper lobe ground-glass nodular opacities and nodules measuring 5 mm or less. New left lower lobe 3  mm nodule, image 72. Mild emphysema. Interval improvement in left hilar lymphadenopathy. Intact mediastinal vascular structures. Moderate coronary artery calcifications. No cardiomegaly.  New right upper anterior chest wall 1.7 x 2.3 cm soft tissue density with skin defect.  No hepatic lesions. No calcified gallstones. Normal appearance of the pancreas, spleen, and adrenal glands. Left renal cysts. No hydronephrosis. No obstructing renal stones. No evidence of bowel obstruction or inflammation. Mild colonic diverticulosis. No free fluid or free air. No intra-abdominal lymphadenopathy. 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  Nonacute appearing midthoracic vertebral body compression fractures, not significantly changed.  IMPRESSION: Interval decrease in size of left  upper lobe mass, now measuring 1.4 x 1 cm in cross-section compared to a previous measurement of 2.2 x 2.1 cm. Interval improvement in left hilar lymphadenopathy. Known history of carcinoma.  Multiple new left upper lobe ground-glass nodular opacities and nodules measuring 5 mm or less. New left lower lobe 3 mm nodule. Differential includes infectious, inflammatory, and neoplastic etiologies. Short-term follow-up recommended.  New right upper anterior chest wall 1.7 x 2.3 cm soft tissue density with skin defect. Indeterminate. Correlate with clinical examination.  LABS:       Latest Ref Rng & Units 08/28/2023   12:00 AM 08/16/2023   12:00 AM 08/06/2023   12:00 AM  CBC  WBC  0.4     8.8     0.6      Hemoglobin 13.5 - 17.5 7.8     8.9     9.7      Hematocrit 41 - 53 22     26     28       Platelets 150 - 400 K/uL 78     115     88         This result is from an external source.      Latest Ref Rng & Units 08/31/2023    9:28 AM 08/28/2023   12:00 AM 08/16/2023    8:52 AM  CMP  Glucose 70 - 99 mg/dL 409   811   BUN 8 - 23 mg/dL 15  21     12    Creatinine 0.61 - 1.24 mg/dL 9.14  0.8     7.82   Sodium 135 - 145 mmol/L 132  128     135   Potassium 3.5 - 5.1 mmol/L 3.6  4.0     3.6   Chloride 98 - 111 mmol/L 98  96     99   CO2 22 - 32 mmol/L 26  26     28    Calcium 8.9 - 10.3 mg/dL 9.0  8.9     9.0   Total Protein 6.5 - 8.1 g/dL 6.4   6.9   Total Bilirubin 0.3 - 1.2 mg/dL 0.6   0.3   Alkaline Phos 38 - 126 U/L 91  112     98   AST 15 - 41 U/L 14  18     15    ALT 0 - 44 U/L 11  10     10       This result is from an external source.    ASSESSMENT & PLAN:  A 77 y.o. male who has limited stage small cell lung cancer.  In clinic today, I went over all of his CT scan images with him, for which he could see that the previously hypermetabolic lymphadenopathy associated with his small cell lung cancer has dissipated.  His primary left lung  mass has also decreased in size.  Overall, I consider  him to have had a good partial response to his chemoradiation.  There is new data out from [facility] 2024 which showed an overall survival benefit when giving 2 years of Durvalumab immunotherapy after chemoradiation for limited stage small cell lung cancer.  Despite hearing this, he is not particularly interested in considering this treatment option.  Based upon this, I will follow this gentleman with CT scans every 4 months over this first year after his chemoradiation to ensure there is no evidence of early disease recurrence.  His next chest CT will be in March 2025, with me seeing him a day later to go over his CT images and their implications.   The patient and his family understand all the plans discussed today and are in agreement with them.    Shatarra Wehling Kirby Funk, MD

## 2023-10-02 ENCOUNTER — Telehealth: Payer: Self-pay | Admitting: Oncology

## 2023-10-02 ENCOUNTER — Inpatient Hospital Stay: Payer: Medicare HMO | Attending: Oncology | Admitting: Oncology

## 2023-10-02 ENCOUNTER — Other Ambulatory Visit: Payer: Self-pay | Admitting: Oncology

## 2023-10-02 ENCOUNTER — Encounter: Payer: Self-pay | Admitting: Oncology

## 2023-10-02 VITALS — BP 121/53 | HR 77 | Temp 97.7°F | Resp 18 | Ht 66.0 in | Wt 194.5 lb

## 2023-10-02 DIAGNOSIS — R590 Localized enlarged lymph nodes: Secondary | ICD-10-CM | POA: Insufficient documentation

## 2023-10-02 DIAGNOSIS — Z79634 Long term (current) use of topoisomerase inhibitor: Secondary | ICD-10-CM | POA: Insufficient documentation

## 2023-10-02 DIAGNOSIS — Z79630 Long term (current) use of alkylating agent: Secondary | ICD-10-CM | POA: Diagnosis not present

## 2023-10-02 DIAGNOSIS — C3412 Malignant neoplasm of upper lobe, left bronchus or lung: Secondary | ICD-10-CM | POA: Diagnosis present

## 2023-10-02 NOTE — Telephone Encounter (Signed)
Patient has been scheduled for follow-up visit per 10/02/23 LOS.  Pt given an appt calendar with date and time.

## 2023-10-04 ENCOUNTER — Other Ambulatory Visit: Payer: Self-pay

## 2023-10-08 ENCOUNTER — Encounter: Payer: Self-pay | Admitting: Oncology

## 2023-10-12 ENCOUNTER — Encounter: Payer: Self-pay | Admitting: Oncology

## 2023-10-12 ENCOUNTER — Other Ambulatory Visit: Payer: Self-pay

## 2023-10-17 ENCOUNTER — Encounter: Payer: Self-pay | Admitting: Oncology

## 2023-10-17 ENCOUNTER — Other Ambulatory Visit: Payer: Self-pay

## 2023-10-22 NOTE — Progress Notes (Addendum)
Spoke with patient and he states that his spouse, daughter and himself had discussed prophalatic brain radiation and they have decided not to proceed with it.  Dr. Thersa Salt aware.

## 2023-10-26 ENCOUNTER — Other Ambulatory Visit: Payer: Self-pay

## 2023-10-27 ENCOUNTER — Ambulatory Visit (HOSPITAL_BASED_OUTPATIENT_CLINIC_OR_DEPARTMENT_OTHER)
Admission: EM | Admit: 2023-10-27 | Discharge: 2023-10-27 | Disposition: A | Discharge status: Home / Self Care | Payer: Medicare HMO

## 2023-10-27 ENCOUNTER — Encounter (HOSPITAL_BASED_OUTPATIENT_CLINIC_OR_DEPARTMENT_OTHER): Payer: Self-pay

## 2023-10-27 DIAGNOSIS — R140 Abdominal distension (gaseous): Secondary | ICD-10-CM

## 2023-10-27 DIAGNOSIS — R1011 Right upper quadrant pain: Secondary | ICD-10-CM

## 2023-10-27 DIAGNOSIS — Z85118 Personal history of other malignant neoplasm of bronchus and lung: Secondary | ICD-10-CM

## 2023-10-27 NOTE — ED Provider Notes (Signed)
Manuel Sanchez CARE    CSN: [id number] Arrival date & time: 10/27/23  0919      History   Chief Complaint Chief Complaint  Patient presents with   Abdominal Pain   Sore Throat    HPI Manuel Sanchez is a 77 y.o. male.   Manuel Sanchez is a 77 y.o. male presenting for chief complaint of generalized abdominal discomfort that started approximately 1 month ago and has worsened significantly over the last 1-2 weeks. Pain is localized to the generalized mid to upper right abdomen, described as a tightness and "feeling like my belly is going to pop", and is currently a 9/10. Nothing makes pain better or worse. Reports normal bowel movements without blood/mucous to the stools. No recent abnormal bruising, dizziness, fevers, chills. Reports nausea and vomiting with eating, states he has lost "a significant amount of weight" in the last 30 days due to loss of appetite and N/V with eating. Denies chest pain, palpitations, acid reflux, and epigastric pain. Avoids known GERD triggers. No recent sick contacts with similar symptoms.  Chronically short of breath, history of COPD, CAD, and lung cancer. Recent chemo and radiation, follows with cancer center.    Abdominal Pain Sore Throat Associated symptoms include abdominal pain.    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 BMI 29.19   COPD (chronic obstructive pulmonary disease) (HCC) 07/23/2019   Coronary artery disease  involving native coronary artery of native heart without angina pectoris 04/22/2015   Formatting of this note might be different from the original. Calcification noted on CT of his chest in 2016 Formatting of this note might be different from the original. 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 Bernadette Hoit in past.  Will see again.   Malaise and fatigue 04/24/2016   Mass of lower lobe of left lung 09/10/2019   Formatting of this note might be different from the original. Will need a follow up CT Pt aware   Mediastinal adenopathy 05/29/2023   Other nonspecific abnormal finding of lung field 04/11/2023   2.3 cm CT [facility] 03/2023.  Lymphadenopathy     Peripheral vascular disease (HCC) 04/24/2016   Formatting of this note might be different from the original. 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   Skin cancer of nose 07/23/2019   Vitamin B12 deficiency 07/23/2019    Patient Active Problem List   Diagnosis Date Noted   Nausea without vomiting 08/06/2023   Weakness 08/06/2023   Small cell lung cancer, left upper lobe (HCC) 06/05/2023   Mediastinal adenopathy 05/29/2023   Other nonspecific abnormal finding of lung field 04/11/2023   GERD  without esophagitis 08/19/2020   Essential hypertension 08/03/2020   Arthritis of foot, right, 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   Elevated blood-pressure reading without diagnosis of hypertension 01/22/2020   Abnormal nuclear stress test 10/20/2019   Iron deficiency anemia 09/10/2019   Mass of lower lobe of left lung 09/10/2019   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   Arthritis 07/23/2019   Chest discomfort 07/23/2019   Ex-smoker 07/23/2019   CAD (coronary artery disease) 07/23/2019   COPD (chronic obstructive pulmonary disease) (HCC) 07/23/2019   Peripheral vascular disease, unspecified (HCC) 07/23/2019   Skin cancer of nose 02/13/2018   Arthritis of right acromioclavicular joint 05/18/2017   Chronic obstructive pulmonary disease (HCC) 08/23/2016   Peripheral vascular disease (HCC) 04/24/2016   Cyst of left kidney 04/24/2016   Hypercholesterolemia 04/24/2016   Vitamin B12 deficiency 04/24/2016   Class 1 obesity due to excess calories with serious comorbidity and body mass index (BMI) of 30.0 to 30.9 in adult 04/24/2016   High risk medication use 04/24/2016   Malaise and fatigue 04/24/2016   Pre-diabetes 04/24/2016   Coronary artery disease involving native coronary artery of native heart without angina pectoris 04/22/2015   Dyslipidemia 04/22/2015   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 Authorizing Provider  albuterol (ACCUNEB) 0.63 MG/3ML nebulizer solution Inhale 3 mLs into the lungs every 6 (six) hours as needed for wheezing. 08/19/20   [provider]  albuterol (VENTOLIN HFA) 108 (90 BASE) MCG/ACT inhaler Inhale 2 puffs into the lungs every 4 (four) hours as needed for wheezing or shortness of breath.     [provider]   atorvastatin (LIPITOR) 40 MG tablet Take 40 mg by mouth daily. 01/06/22   [provider]  budesonide-formoterol (SYMBICORT) 160-4.5 MCG/ACT inhaler Inhale 2 puffs into the lungs at bedtime. 01/03/22   [provider]  Calcium Carb-Cholecalciferol (OYSTER SHELL CALCIUM W/D) 500-5 MG-MCG TABS Take 1 tablet by mouth daily. 09/10/19   [provider]  cholecalciferol (VITAMIN D) 1000 UNITS tablet Take 1,000 Units by mouth daily.    [provider]  co-enzyme Q-10 30 MG capsule Take 30 mg by mouth daily.    [provider]  Cyanocobalamin (B-12) 2500 MCG TABS Take 1 tablet by mouth every morning.    [provider]  docusate sodium (COLACE) 100 MG capsule Take 100 mg by mouth at bedtime. 06/18/23   [provider]  ferrous sulfate 325 (65 FE) MG EC tablet Take 325 mg by mouth daily with breakfast.    [provider]  fluticasone (FLONASE) 50 MCG/ACT nasal spray Place 2 sprays into both nostrils daily. 04/09/23   [provider]  ipratropium-albuterol (DUONEB) 0.5-2.5 (3) MG/3ML SOLN Take 3 mLs by nebulization every 6 (six) hours.    [provider]  levofloxacin (LEVAQUIN) 750 MG tablet Take 1  tablet (750 mg total) by mouth daily. 07/13/23   Weston Settle, MD  magic mouthwash SOLN Take 5 mLs by mouth. Called into [facility] 07-05-2023.    [provider]  montelukast (SINGULAIR) 10 MG tablet Take 10 mg by mouth at bedtime. 01/03/22   [provider]  nitroGLYCERIN (NITROSTAT) 0.4 MG SL tablet Place 0.4 mg under the tongue every 5 (five) minutes as needed. 04/27/20   [provider]  nystatin (MYCOSTATIN/NYSTOP) powder Apply 1 Application topically 3 (three) times daily. 06/25/23   Mosher, Harvin Hazel A, PA-C  pantoprazole (PROTONIX) 40 MG tablet Take 40 mg by mouth daily.    [provider]  triamcinolone ointment (KENALOG) 0.1 % Apply 1 application topically as needed. 04/27/20    [provider]  zinc gluconate 3.75 mg/mL SOLN Take 1 tablet by mouth every morning.    [provider]    Family History Family History  Problem Relation Age of Onset   Heart disease Mother    Stroke Mother    Hypertension Mother    Heart attack Mother    Hearing loss Mother    Colon cancer Father    Stomach cancer Sister    Lung cancer Sister     Social History Social History   Tobacco Use   Smoking status: Former    Current packs/day: 0.00    Average packs/day: 0.8 packs/day for 45.0 years (33.8 ttl pk-yrs)    Types: Cigarettes    Start date: 11/20/1957    Quit date: 11/20/2002    Years since quitting: 20.9   Smokeless tobacco: Never  Vaping Use   Vaping status: Never Used  Substance Use Topics   Alcohol use: No   Drug use: No     Allergies   Codeine, Levofloxacin, and Levofloxacin in d5w   Review of Systems Review of Systems  Gastrointestinal:  Positive for abdominal pain.  Per HPI   Physical Exam Triage Vital Signs ED Triage Vitals  Encounter Vitals Group     BP 10/27/23 1013 118/70     Systolic BP Percentile --      Diastolic BP Percentile --      Pulse Rate 10/27/23 1013 79     Resp 10/27/23 1013 20     Temp 10/27/23 1013 (!) 97.4 F (36.3 C)     Temp Source 10/27/23 1013 Oral     SpO2 10/27/23 1013 91 %     Weight --      Height --      Head Circumference --      Peak Flow --      Pain Score 10/27/23 1014 9     Pain Loc --      Pain Education --      Exclude from Growth Chart --    No data found.  Updated Vital Signs BP 118/70 (BP Location: Right Arm)   Pulse 79   Temp (!) 97.4 F (36.3 C) (Oral)   Resp 20   SpO2 91%   Visual Acuity Right Eye Distance:   Left Eye Distance:   Bilateral Distance:    Right Eye Near:   Left Eye Near:    Bilateral Near:     Physical Exam Vitals and nursing note reviewed.  Constitutional:      Appearance: He is not ill-appearing or toxic-appearing.  HENT:     Head:  Normocephalic and atraumatic.     Right Ear: Hearing and external ear normal.  Left Ear: Hearing and external ear normal.     Nose: Nose normal.     Mouth/Throat:     Lips: Pink.     Mouth: Mucous membranes are moist. No injury.     Tongue: No lesions. Tongue does not deviate from midline.     Palate: No mass and lesions.     Pharynx: Oropharynx is clear. Uvula midline. No pharyngeal swelling, oropharyngeal exudate, posterior oropharyngeal erythema or uvula swelling.     Tonsils: No tonsillar exudate or tonsillar abscesses.  Eyes:     General: Lids are normal. Vision grossly intact. Gaze aligned appropriately.     Extraocular Movements: Extraocular movements intact.     Conjunctiva/sclera: Conjunctivae normal.  Cardiovascular:     Rate and Rhythm: Normal rate and regular rhythm.     Heart sounds: S1 normal and S2 normal. Heart sounds are distant.  Pulmonary:     Effort: Pulmonary effort is normal. No respiratory distress.     Breath sounds: Normal breath sounds and air entry. No decreased breath sounds, wheezing, rhonchi or rales.  Abdominal:     General: Abdomen is protuberant. Bowel sounds are increased. There is distension.     Palpations: Abdomen is soft. There is no mass.     Tenderness: There is abdominal tenderness in the right upper quadrant, right lower quadrant and periumbilical area. There is no right CVA tenderness, left CVA tenderness or guarding.  Musculoskeletal:     Cervical back: Neck supple.     Right lower leg: No edema.     Left lower leg: No edema.  Skin:    General: Skin is warm and dry.     Capillary Refill: Capillary refill takes less than 2 seconds.     Findings: No rash.  Neurological:     General: No focal deficit present.     Mental Status: He is alert and oriented to person, place, and time. Mental status is at baseline.     Cranial Nerves: No dysarthria or facial asymmetry.  Psychiatric:        Mood and Affect: Mood normal.        Speech: Speech  normal.        Behavior: Behavior normal.        Thought Content: Thought content normal.        Judgment: Judgment normal.      UC Treatments / Results  Labs (all labs ordered are listed, but only abnormal results are displayed) Labs Reviewed - No data to display  EKG   Radiology No results found.  Procedures Procedures (including critical care time)  Medications Ordered in UC Medications - No data to display  Initial Impression / Assessment and Plan / UC Course  I have reviewed the triage vital signs and the nursing notes.  Pertinent labs & imaging results that were available during my care of the patient were reviewed by me and considered in my medical decision making (see chart for details).   1. Abdominal distension, RUQ abdominal pain, history of lung cancer Clinical concern for intraabdominal pathology including liver abnormality given abdominal distension, tenderness, and recent weight loss with nausea/vomiting.  Vital signs are hemodynamically stable.  Recommend discharge to ED for STAT labs, possible imaging to rule out ascites, liver mets, etc.   Discussed recommendations and risks of deferring ED visit with patient who expresses understanding and agreement with plan to go to closest ER for further workup.  Discharged in stable condition to the ER via  personal vehicle with wife.  Final Clinical Impressions(s) / UC Diagnoses   Final diagnoses:  Abdominal distension  History of lung cancer  Abdominal pain, right upper quadrant   Discharge Instructions   None    ED Prescriptions   None    PDMP not reviewed this encounter.   Carlisle Beers, [place] 10/27/23 1050

## 2023-10-27 NOTE — ED Notes (Signed)
Patient is being discharged from the Urgent Care and sent to the Emergency Department via pov . Per M. Stanhope, NP, patient is in need of higher level of care due to distended abdomen. Patient is aware and verbalizes understanding of plan of care.  Vitals:   10/27/23 1013  BP: 118/70  Pulse: 79  Resp: 20  Temp: (!) 97.4 F (36.3 C)  SpO2: 91%

## 2023-10-27 NOTE — ED Triage Notes (Signed)
Patient has hx of lung cancer. Recently finished chemo and radiation. Reports abdominal pain, bloating, and sore throat. Unable to eat. States has been losing weight over last 30 days. To see oncologist Tuesday for lab work.Patient is hard of hearing. Abdomen is distended. Points to umbilical area as to location of pain. States bowel movements are normal.

## 2023-10-29 ENCOUNTER — Encounter: Payer: Self-pay | Admitting: Oncology

## 2023-10-29 ENCOUNTER — Other Ambulatory Visit: Payer: Self-pay | Admitting: Oncology

## 2023-10-29 DIAGNOSIS — C3412 Malignant neoplasm of upper lobe, left bronchus or lung: Secondary | ICD-10-CM

## 2023-10-30 ENCOUNTER — Telehealth: Payer: Self-pay | Admitting: Oncology

## 2023-10-30 ENCOUNTER — Ambulatory Visit: Payer: Medicare HMO

## 2023-10-30 ENCOUNTER — Encounter: Payer: Self-pay | Admitting: Oncology

## 2023-10-30 ENCOUNTER — Inpatient Hospital Stay: Payer: Medicare HMO

## 2023-10-30 NOTE — Telephone Encounter (Signed)
10/30/23 Spoke with patients wife and rescheduled all appts.

## 2023-10-31 ENCOUNTER — Other Ambulatory Visit: Payer: Self-pay

## 2023-11-01 ENCOUNTER — Other Ambulatory Visit: Payer: Medicare HMO

## 2023-11-01 ENCOUNTER — Ambulatory Visit: Payer: Medicare HMO | Admitting: Oncology

## 2023-11-02 ENCOUNTER — Ambulatory Visit: Payer: Medicare HMO | Admitting: Oncology

## 2023-11-02 ENCOUNTER — Other Ambulatory Visit: Payer: Medicare HMO

## 2023-11-04 NOTE — Progress Notes (Unsigned)
[facility]  [address] [place],  [place]  [address] [phone]  Clinic Day:  11/05/2023  Referring physician: Gordan Payment., MD   HISTORY OF PRESENT ILLNESS:  The patient is a 77 y.o. male with limited stage small cell lung cancer.  He completed definitive chemoradiation in October 2024 for his disease.  His chemotherapy consisted of 4 cycles of cisplatin/etoposide, for which parts were given in conjunction with daily radiation.  CT scans afterwards showed essentially a complete response to this therapy.  He comes in today to re-discuss using Durvalumab in the maintenance setting for his limited stage disease.  Since his last visit, the patient has been doing okay.  He does have occasional nausea, but multiple poorly manageable.  Of note, the patient declined undergoing prophylactic cranial irradiation.  PHYSICAL EXAM:  Blood pressure (!) 124/58, pulse 75, temperature 97.8 F (36.6 C), temperature source Oral, resp. rate 16, height 5\' 6"  (1.676 m), weight 184 lb 12.8 oz (83.8 kg), SpO2 93%. Wt Readings from Last 3 Encounters:  11/05/23 184 lb 12.8 oz (83.8 kg)  10/02/23 194 lb 8 oz (88.2 kg)  08/23/23 199 lb 4 oz (90.4 kg)   Body mass index is 29.83 kg/m. Performance status (ECOG): 1 - Symptomatic but completely ambulatory Physical Exam Constitutional:      Appearance: Normal appearance. He is not ill-appearing.  HENT:     Mouth/Throat:     Mouth: Mucous membranes are moist.     Pharynx: Oropharynx is clear. No oropharyngeal exudate or posterior oropharyngeal erythema.  Cardiovascular:     Rate and Rhythm: Normal rate and regular rhythm.     Heart sounds: No murmur heard.    No friction rub. No gallop.  Pulmonary:     Effort: Pulmonary effort is normal. No respiratory distress.     Breath sounds: Normal breath sounds. No wheezing, rhonchi or rales.  Abdominal:     General: Bowel sounds are normal. There is no distension.      Palpations: Abdomen is soft. There is no mass.     Tenderness: There is no abdominal tenderness.  Musculoskeletal:        General: No swelling.     Right lower leg: No edema.     Left lower leg: No edema.  Lymphadenopathy:     Cervical: No cervical adenopathy.     Upper Body:     Right upper body: No supraclavicular or axillary adenopathy.     Left upper body: No supraclavicular or axillary adenopathy.     Lower Body: No right inguinal adenopathy. No left inguinal adenopathy.  Skin:    General: Skin is warm.     Coloration: Skin is not jaundiced.     Findings: No lesion or rash.  Neurological:     General: No focal deficit present.     Mental Status: He is alert and oriented to person, place, and time. Mental status is at baseline.  Psychiatric:        Mood and Affect: Mood normal.        Behavior: Behavior normal.        Thought Content: Thought content normal.    LABS:      Latest Ref Rng & Units 11/05/2023    9:49 AM 08/28/2023   12:00 AM 08/16/2023   12:00 AM  CBC  WBC 4.0 - 10.5 K/uL 4.8  0.4     8.8      Hemoglobin 13.0 - 17.0  g/dL 9.7  7.8     8.9      Hematocrit 39.0 - 52.0 % 30.1  22     26       Platelets 150 - 400 K/uL 252  78     115         This result is from an external source.      Latest Ref Rng & Units 11/05/2023    9:49 AM 08/31/2023    9:28 AM 08/28/2023   12:00 AM  CMP  Glucose 70 - 99 mg/dL 098  119    BUN 8 - 23 mg/dL 12  15  21       Creatinine 0.61 - 1.24 mg/dL 1.47  8.29  0.8      Sodium 135 - 145 mmol/L 139  132  128      Potassium 3.5 - 5.1 mmol/L 3.4  3.6  4.0      Chloride 98 - 111 mmol/L 100  98  96      CO2 22 - 32 mmol/L 27  26  26       Calcium 8.9 - 10.3 mg/dL 56.2  9.0  8.9      Total Protein 6.5 - 8.1 g/dL 6.8  6.4    Total Bilirubin <1.2 mg/dL 0.3  0.6    Alkaline Phos 38 - 126 U/L 109  91  112      AST 15 - 41 U/L 19  14  18       ALT 0 - 44 U/L 5  11  10          This result is from an external source.   ASSESSMENT & PLAN:   A 77 y.o. male with limited stage small cell lung cancer.  As mentioned previously, this patient already completed his chemoradiation in October 2024.  Since his last visit, maintenance Durvalumab did become approved in the treatment of limited stage small cell lung cancer when there has been  significant improvement after initial chemoradiation.  Durvalumab can be given up to a maximum of 2 years to help prevent future small cell lung cancer recurrence.  He was made aware of the side effects which can go along with this immunotherapy drug, including diarrhea, severe shortness of breath, skin and thyroid changes.  He will proceed with his first cycle of maintenance Durvalumab today.  I will see him back in 4 weeks before he heads into his second cycle of Durvalumab immunotherapy.  The patient and his family understand all the plans discussed today and arel in agreement with them.   Nargis Abrams Kirby Funk, MD

## 2023-11-05 ENCOUNTER — Encounter: Payer: Self-pay | Admitting: Oncology

## 2023-11-05 ENCOUNTER — Inpatient Hospital Stay: Payer: Medicare HMO

## 2023-11-05 ENCOUNTER — Ambulatory Visit: Payer: Medicare HMO | Admitting: Oncology

## 2023-11-05 ENCOUNTER — Other Ambulatory Visit: Payer: Medicare HMO

## 2023-11-05 ENCOUNTER — Inpatient Hospital Stay: Payer: Medicare HMO | Attending: Oncology

## 2023-11-05 ENCOUNTER — Inpatient Hospital Stay (HOSPITAL_BASED_OUTPATIENT_CLINIC_OR_DEPARTMENT_OTHER): Payer: Medicare HMO | Admitting: Oncology

## 2023-11-05 VITALS — BP 124/58 | HR 75 | Temp 97.8°F | Resp 16 | Ht 66.0 in | Wt 184.8 lb

## 2023-11-05 DIAGNOSIS — Z923 Personal history of irradiation: Secondary | ICD-10-CM | POA: Diagnosis not present

## 2023-11-05 DIAGNOSIS — C3412 Malignant neoplasm of upper lobe, left bronchus or lung: Secondary | ICD-10-CM | POA: Insufficient documentation

## 2023-11-05 DIAGNOSIS — R110 Nausea: Secondary | ICD-10-CM | POA: Insufficient documentation

## 2023-11-05 DIAGNOSIS — Z9221 Personal history of antineoplastic chemotherapy: Secondary | ICD-10-CM | POA: Insufficient documentation

## 2023-11-05 DIAGNOSIS — Z5112 Encounter for antineoplastic immunotherapy: Secondary | ICD-10-CM | POA: Diagnosis present

## 2023-11-05 DIAGNOSIS — Z79899 Other long term (current) drug therapy: Secondary | ICD-10-CM | POA: Diagnosis not present

## 2023-11-05 LAB — CBC WITH DIFFERENTIAL (CANCER CENTER ONLY)
Abs Immature Granulocytes: 0.02 10*3/uL (ref 0.00–0.07)
Basophils Absolute: 0 10*3/uL (ref 0.0–0.1)
Basophils Relative: 1 %
Eosinophils Absolute: 0.3 10*3/uL (ref 0.0–0.5)
Eosinophils Relative: 6 %
HCT: 30.1 % — ABNORMAL LOW (ref 39.0–52.0)
Hemoglobin: 9.7 g/dL — ABNORMAL LOW (ref 13.0–17.0)
Immature Granulocytes: 0 %
Lymphocytes Relative: 24 %
Lymphs Abs: 1.2 10*3/uL (ref 0.7–4.0)
MCH: 28.2 pg (ref 26.0–34.0)
MCHC: 32.2 g/dL (ref 30.0–36.0)
MCV: 87.5 fL (ref 80.0–100.0)
Monocytes Absolute: 0.7 10*3/uL (ref 0.1–1.0)
Monocytes Relative: 15 %
Neutro Abs: 2.6 10*3/uL (ref 1.7–7.7)
Neutrophils Relative %: 54 %
Platelet Count: 252 10*3/uL (ref 150–400)
RBC: 3.44 MIL/uL — ABNORMAL LOW (ref 4.22–5.81)
RDW: 14.6 % (ref 11.5–15.5)
WBC Count: 4.8 10*3/uL (ref 4.0–10.5)
nRBC: 0 % (ref 0.0–0.2)
nRBC: 0 /100{WBCs}

## 2023-11-05 LAB — CMP (CANCER CENTER ONLY)
ALT: <5 U/L (ref 0–44)
AST: 19 U/L (ref 15–41)
Albumin: 3.5 g/dL (ref 3.5–5.0)
Alkaline Phosphatase: 109 U/L (ref 38–126)
Anion gap: 13 (ref 5–15)
BUN: 12 mg/dL (ref 8–23)
CO2: 27 mmol/L (ref 22–32)
Calcium: 10.1 mg/dL (ref 8.9–10.3)
Chloride: 100 mmol/L (ref 98–111)
Creatinine: 1 mg/dL (ref 0.61–1.24)
GFR, Estimated: >60 mL/min (ref >60)
Glucose, Bld: 114 mg/dL — ABNORMAL HIGH (ref 70–99)
Potassium: 3.4 mmol/L — ABNORMAL LOW (ref 3.5–5.1)
Sodium: 139 mmol/L (ref 135–145)
Total Bilirubin: 0.3 mg/dL (ref <1.2)
Total Protein: 6.8 g/dL (ref 6.5–8.1)

## 2023-11-05 LAB — TSH: TSH: 3.658 u[IU]/mL (ref 0.350–4.500)

## 2023-11-05 MED ORDER — HEPARIN SOD (PORK) LOCK FLUSH 100 UNIT/ML IV SOLN: 500.0000 [IU] | Freq: Once | INTRAVENOUS | Status: DC | PRN

## 2023-11-05 MED ORDER — SODIUM CHLORIDE 0.9 % IV SOLN: INTRAVENOUS | Status: DC

## 2023-11-05 MED ORDER — SODIUM CHLORIDE 0.9 % IV SOLN
1500.0000 mg | Freq: Once | INTRAVENOUS | Status: AC
  Administered 2023-11-05: 1500 mg via INTRAVENOUS
  Filled 2023-11-05: qty 30

## 2023-11-05 MED ORDER — SODIUM CHLORIDE 0.9% FLUSH: 10.0000 mL | INTRAVENOUS | Status: DC | PRN

## 2023-11-05 NOTE — Progress Notes (Signed)
..  Pharmacist Chemotherapy Monitoring - Initial Assessment    Anticipated start date: 11/05/23  The following has been reviewed per standard work regarding the patient's treatment regimen: The patient's diagnosis, treatment plan and drug doses, and organ/hematologic function Lab orders and baseline tests specific to treatment regimen  The treatment plan start date, drug sequencing, and pre-medications Prior authorization status  Patient's documented medication list, including drug-drug interaction screen and prescriptions for anti-emetics and supportive care specific to the treatment regimen The drug concentrations, fluid compatibility, administration routes, and timing of the medications to be used The patient's access for treatment and lifetime cumulative dose history, if applicable  The patient's medication allergies and previous infusion related reactions, if applicable   Changes made to treatment plan:  N/A  Follow up needed:  N/A   Manuel Sanchez, [facility], 11/05/2023  11:35 AM

## 2023-11-05 NOTE — Patient Instructions (Signed)
Durvalumab Injection What is this medication? DURVALUMAB (dur VAL ue mab) treats some types of cancer. It works by helping your immune system slow or stop the spread of cancer cells. It is a monoclonal antibody. This medicine may be used for other purposes; ask your health care provider or pharmacist if you have questions. COMMON BRAND NAME(S): IMFINZI What should I tell my care team before I take this medication? They need to know if you have any of these conditions: Allogeneic stem cell transplant (uses someone else's stem cells) Autoimmune diseases, such as Crohn disease, ulcerative colitis, lupus History of chest radiation Nervous system problems, such as Guillain-Barre syndrome, myasthenia gravis Organ transplant An unusual or allergic reaction to durvalumab, other medications, foods, dyes, or preservatives Pregnant or trying to get pregnant Breast-feeding How should I use this medication? This medication is infused into a vein. It is given by your care team in a hospital or clinic setting. A special MedGuide will be given to you before each treatment. Be sure to read this information carefully each time. Talk to your care team about the use of this medication in children. Special care may be needed. Overdosage: If you think you have taken too much of this medicine contact a poison control center or emergency room at once. NOTE: This medicine is only for you. Do not share this medicine with others. What if I miss a dose? Keep appointments for follow-up doses. It is important not to miss your dose. Call your care team if you are unable to keep an appointment. What may interact with this medication? Interactions have not been studied. This list may not describe all possible interactions. Give your health care provider a list of all the medicines, herbs, non-prescription drugs, or dietary supplements you use. Also tell them if you smoke, drink alcohol, or use illegal drugs. Some items may  interact with your medicine. What should I watch for while using this medication? Your condition will be monitored carefully while you are receiving this medication. You may need blood work while taking this medication. This medication may cause serious skin reactions. They can happen weeks to months after starting the medication. Contact your care team right away if you notice fevers or flu-like symptoms with a rash. The rash may be red or purple and then turn into blisters or peeling of the skin. You may also notice a red rash with swelling of the face, lips, or lymph nodes in your neck or under your arms. Tell your care team right away if you have any change in your eyesight. Talk to your care team if you may be pregnant. Serious birth defects can occur if you take this medication during pregnancy and for 3 months after the last dose. You will need a negative pregnancy test before starting this medication. Contraception is recommended while taking this medication and for 3 months after the last dose. Your care team can help you find the option that works for you. Do not breastfeed while taking this medication and for 3 months after the last dose. What side effects may I notice from receiving this medication? Side effects that you should report to your care team as soon as possible: Allergic reactions--skin rash, itching, hives, swelling of the face, lips, tongue, or throat Dry cough, shortness of breath or trouble breathing Eye pain, redness, irritation, or discharge with blurry or decreased vision Heart muscle inflammation--unusual weakness or fatigue, shortness of breath, chest pain, fast or irregular heartbeat, dizziness, swelling of the   ankles, feet, or hands Hormone gland problems--headache, sensitivity to light, unusual weakness or fatigue, dizziness, fast or irregular heartbeat, increased sensitivity to cold or heat, excessive sweating, constipation, hair loss, increased thirst or amount of  urine, tremors or shaking, irritability Infusion reactions--chest pain, shortness of breath or trouble breathing, feeling faint or lightheaded Kidney injury (glomerulonephritis)--decrease in the amount of urine, red or dark brown urine, foamy or bubbly urine, swelling of the ankles, hands, or feet Liver injury--right upper belly pain, loss of appetite, nausea, light-colored stool, dark yellow or brown urine, yellowing skin or eyes, unusual weakness or fatigue Pain, tingling, or numbness in the hands or feet, muscle weakness, change in vision, confusion or trouble speaking, loss of balance or coordination, trouble walking, seizures Rash, fever, and swollen lymph nodes Redness, blistering, peeling, or loosening of the skin, including inside the mouth Sudden or severe stomach pain, bloody diarrhea, fever, nausea, vomiting Side effects that usually do not require medical attention (report these to your care team if they continue or are bothersome): Bone, joint, or muscle pain Diarrhea Fatigue Loss of appetite Nausea Skin rash This list may not describe all possible side effects. Call your doctor for medical advice about side effects. You may report side effects to FDA at 1-800-FDA-1088. Where should I keep my medication? This medication is given in a hospital or clinic. It will not be stored at home. NOTE: This sheet is a summary. It may not cover all possible information. If you have questions about this medicine, talk to your doctor, pharmacist, or health care provider.  2024 Elsevier/Gold Standard (2022-03-21 00:00:00)  

## 2023-11-06 ENCOUNTER — Other Ambulatory Visit: Payer: Medicare HMO

## 2023-11-06 ENCOUNTER — Telehealth: Payer: Self-pay

## 2023-11-06 LAB — T4: T4, Total: 7.7 ug/dL (ref 4.5–12.0)

## 2023-11-06 NOTE — Telephone Encounter (Signed)
I spoke with pt's wife, with pt in the room lying on couch. Pt has experienced 2 episodes of diarrhea today. I educated them on making sure he drinks a cup of fluid for every diarrhea episode to prevent dehydration. Also, he should start Imodium up to 8 tabs/day. He will take 2 tabs after initial diarrhea of the day, and 1 after episode thereafter up to 8 tab/day. She will call me tomorrow if diarrhea not controlled. Wife verbalized understanding. Pt had a little nausea this morning, no emesis. He has a little phlegm causing intermittent coughing. No SOB, chest pain, bone pain, fever, chills, flushing, headache, skin rash and itching. I reminded them to call us if he were to develop temp of 100.4 or higher, day or night. She verbalized understanding.

## 2023-11-07 ENCOUNTER — Encounter: Payer: Self-pay | Admitting: Oncology

## 2023-11-26 ENCOUNTER — Encounter: Payer: Self-pay | Admitting: Oncology

## 2023-11-28 ENCOUNTER — Encounter: Payer: Self-pay | Admitting: Oncology

## 2023-12-02 NOTE — Progress Notes (Signed)
 [facility]  [address] [place],  [place]  [address] [phone]  Clinic Day:  12/03/2023  Referring physician: Thurmond Cathlyn LABOR., MD   HISTORY OF PRESENT ILLNESS:  The patient is a 78 y.o. male with limited stage small cell lung cancer.  He completed definitive chemoradiation in October 2024 for his disease.  His chemotherapy consisted of 4 cycles of cisplatin /etoposide .  CT scans afterwards showed essentially a complete response to this therapy.  He recently started maintenance durvalumab  to further increase his chances in preventing his disease from returning.  He comes in today to be evaluated before heading into his 2nd cycle of maintenance Durvalumab .  The patient claims to have tolerated his 1st cycle this agent very well.  He denies having any untoward side effects from his Durvalumab  immunotherapy.  He denies having any respiratory symptoms which concern him for early recurrence of his small cell lung cancer.   PHYSICAL EXAM:  Blood pressure (!) 93/43, pulse 69, temperature 97.7 F (36.5 C), temperature source Oral, resp. rate 16, height 5' 6 (1.676 m), weight 183 lb 9.6 oz (83.3 kg), SpO2 94%. Wt Readings from Last 3 Encounters:  12/03/23 183 lb 9.6 oz (83.3 kg)  11/05/23 184 lb 12.8 oz (83.8 kg)  10/02/23 194 lb 8 oz (88.2 kg)   Body mass index is 29.63 kg/m. Performance status (ECOG): 1 - Symptomatic but completely ambulatory Physical Exam Constitutional:      Appearance: Normal appearance. He is not ill-appearing.  HENT:     Mouth/Throat:     Mouth: Mucous membranes are moist.     Pharynx: Oropharynx is clear. No oropharyngeal exudate or posterior oropharyngeal erythema.  Cardiovascular:     Rate and Rhythm: Normal rate and regular rhythm.     Heart sounds: No murmur heard.    No friction rub. No gallop.  Pulmonary:     Effort: Pulmonary effort is normal. No respiratory distress.     Breath sounds: Normal breath sounds. No  wheezing, rhonchi or rales.  Abdominal:     General: Bowel sounds are normal. There is no distension.     Palpations: Abdomen is soft. There is no mass.     Tenderness: There is no abdominal tenderness.  Musculoskeletal:        General: No swelling.     Right lower leg: No edema.     Left lower leg: No edema.  Lymphadenopathy:     Cervical: No cervical adenopathy.     Upper Body:     Right upper body: No supraclavicular or axillary adenopathy.     Left upper body: No supraclavicular or axillary adenopathy.     Lower Body: No right inguinal adenopathy. No left inguinal adenopathy.  Skin:    General: Skin is warm.     Coloration: Skin is not jaundiced.     Findings: No lesion or rash.  Neurological:     General: No focal deficit present.     Mental Status: He is alert and oriented to person, place, and time. Mental status is at baseline.  Psychiatric:        Mood and Affect: Mood normal.        Behavior: Behavior normal.        Thought Content: Thought content normal.    LABS:      Latest Ref Rng & Units 12/03/2023   10:15 AM 11/05/2023    9:49 AM 08/28/2023   12:00 AM  CBC  WBC  4.0 - 10.5 K/uL 7.0  4.8  0.4      Hemoglobin 13.0 - 17.0 g/dL 89.4  9.7  7.8      Hematocrit 39.0 - 52.0 % 33.6  30.1  22      Platelets 150 - 400 K/uL 239  252  78         This result is from an external source.      Latest Ref Rng & Units 12/03/2023   10:15 AM 11/05/2023    9:49 AM 08/31/2023    9:28 AM  CMP  Glucose 70 - 99 mg/dL 897  885  889   BUN 8 - 23 mg/dL 15  12  15    Creatinine 0.61 - 1.24 mg/dL 9.03  8.99  9.16   Sodium 135 - 145 mmol/L 137  139  132   Potassium 3.5 - 5.1 mmol/L 3.9  3.4  3.6   Chloride 98 - 111 mmol/L 101  100  98   CO2 22 - 32 mmol/L 28  27  26    Calcium 8.9 - 10.3 mg/dL 89.8  89.8  9.0   Total Protein 6.5 - 8.1 g/dL 6.9  6.8  6.4   Total Bilirubin 0.0 - 1.2 mg/dL 0.3  0.3  0.6   Alkaline Phos 38 - 126 U/L 157  109  91   AST 15 - 41 U/L 18  19  14    ALT 0  - 44 U/L <5  <5  11    ASSESSMENT & PLAN:  A 78 y.o. male with limited stage small cell lung cancer.  As mentioned previously, this patient already completed his chemoradiation in October 2024.  He will proceed with his second cycle of maintenance Durvalumab  immunotherapy today.  Clinically, the patient looks the best I have seen him since I have been following him.  It appears he is tolerating his immunotherapy very well.  As that is the case, I will see him back in 4 weeks before he heads into his third cycle of maintenance Durvalumab .  The patient and his family understand all the plans discussed today and are in agreement with them.   Apphia Cropley DELENA Kerns, MD

## 2023-12-03 ENCOUNTER — Inpatient Hospital Stay: Payer: HMO | Attending: Oncology | Admitting: Oncology

## 2023-12-03 ENCOUNTER — Inpatient Hospital Stay: Payer: HMO

## 2023-12-03 VITALS — BP 122/75

## 2023-12-03 VITALS — BP 93/43 | HR 69 | Temp 97.7°F | Resp 16 | Ht 66.0 in | Wt 183.6 lb

## 2023-12-03 DIAGNOSIS — C3412 Malignant neoplasm of upper lobe, left bronchus or lung: Secondary | ICD-10-CM | POA: Insufficient documentation

## 2023-12-03 DIAGNOSIS — Z923 Personal history of irradiation: Secondary | ICD-10-CM | POA: Diagnosis not present

## 2023-12-03 DIAGNOSIS — Z5112 Encounter for antineoplastic immunotherapy: Secondary | ICD-10-CM | POA: Diagnosis present

## 2023-12-03 DIAGNOSIS — Z9221 Personal history of antineoplastic chemotherapy: Secondary | ICD-10-CM | POA: Insufficient documentation

## 2023-12-03 LAB — CBC WITH DIFFERENTIAL (CANCER CENTER ONLY)
Abs Immature Granulocytes: 0.02 10*3/uL (ref 0.00–0.07)
Basophils Absolute: 0.1 10*3/uL (ref 0.0–0.1)
Basophils Relative: 1 %
Eosinophils Absolute: 0.3 10*3/uL (ref 0.0–0.5)
Eosinophils Relative: 4 %
HCT: 33.6 % — ABNORMAL LOW (ref 39.0–52.0)
Hemoglobin: 10.5 g/dL — ABNORMAL LOW (ref 13.0–17.0)
Immature Granulocytes: 0 %
Lymphocytes Relative: 27 %
Lymphs Abs: 1.9 10*3/uL (ref 0.7–4.0)
MCH: 26.9 pg (ref 26.0–34.0)
MCHC: 31.3 g/dL (ref 30.0–36.0)
MCV: 85.9 fL (ref 80.0–100.0)
Monocytes Absolute: 0.8 10*3/uL (ref 0.1–1.0)
Monocytes Relative: 12 %
Neutro Abs: 3.9 10*3/uL (ref 1.7–7.7)
Neutrophils Relative %: 56 %
Platelet Count: 239 10*3/uL (ref 150–400)
RBC: 3.91 MIL/uL — ABNORMAL LOW (ref 4.22–5.81)
RDW: 15.8 % — ABNORMAL HIGH (ref 11.5–15.5)
WBC Count: 7 10*3/uL (ref 4.0–10.5)
nRBC: 0 % (ref 0.0–0.2)
nRBC: 0 /100{WBCs}

## 2023-12-03 LAB — CMP (CANCER CENTER ONLY)
ALT: <5 U/L (ref 0–44)
AST: 18 U/L (ref 15–41)
Albumin: 3.9 g/dL (ref 3.5–5.0)
Alkaline Phosphatase: 157 U/L — ABNORMAL HIGH (ref 38–126)
Anion gap: 9 (ref 5–15)
BUN: 15 mg/dL (ref 8–23)
CO2: 28 mmol/L (ref 22–32)
Calcium: 10.1 mg/dL (ref 8.9–10.3)
Chloride: 101 mmol/L (ref 98–111)
Creatinine: 0.96 mg/dL (ref 0.61–1.24)
GFR, Estimated: >60 mL/min (ref >60)
Glucose, Bld: 102 mg/dL — ABNORMAL HIGH (ref 70–99)
Potassium: 3.9 mmol/L (ref 3.5–5.1)
Sodium: 137 mmol/L (ref 135–145)
Total Bilirubin: 0.3 mg/dL (ref 0.0–1.2)
Total Protein: 6.9 g/dL (ref 6.5–8.1)

## 2023-12-03 MED ORDER — SODIUM CHLORIDE 0.9 % IV SOLN: INTRAVENOUS | Status: DC

## 2023-12-03 MED ORDER — HEPARIN SOD (PORK) LOCK FLUSH 100 UNIT/ML IV SOLN
500.0000 [IU] | Freq: Once | INTRAVENOUS | Status: DC | PRN
Start: 2023-12-03 — End: 2023-12-03

## 2023-12-03 MED ORDER — SODIUM CHLORIDE 0.9 % IV SOLN
1500.0000 mg | Freq: Once | INTRAVENOUS | Status: AC
  Administered 2023-12-03: 1500 mg via INTRAVENOUS
  Filled 2023-12-03: qty 30

## 2023-12-03 MED ORDER — SODIUM CHLORIDE 0.9% FLUSH: 10.0000 mL | INTRAVENOUS | Status: DC | PRN

## 2023-12-30 NOTE — Progress Notes (Signed)
[facility]  [address] [place],  [place]  [address] [phone]  Clinic Day:  12/31/2023  Referring physician: Gordan Payment., MD   HISTORY OF PRESENT ILLNESS:  The patient is a 78 y.o. male with limited stage small cell lung cancer.  He completed definitive chemoradiation in October 2024 for his disease.  His chemotherapy consisted of 4 cycles of cisplatin/etoposide.  CT scans afterwards showed essentially a complete response to this therapy.  He recently started maintenance durvalumab to further increase his chances in preventing his disease from returning.  He comes in today to be evaluated before heading into his 3rd cycle of maintenance Durvalumab.  The patient claims to have tolerated his 2nd cycle of this agent very well.  He denies having any untoward side effects from his Durvalumab immunotherapy.  He denies having any respiratory symptoms which concern him for early recurrence of his small cell lung cancer.   PHYSICAL EXAM:  Blood pressure 113/65, pulse 70, temperature 98.2 F (36.8 C), temperature source Oral, resp. rate 20, height 5\' 6"  (1.676 m), weight 183 lb 3.2 oz (83.1 kg), SpO2 93%. Wt Readings from Last 3 Encounters:  12/31/23 183 lb 3.2 oz (83.1 kg)  12/03/23 183 lb 9.6 oz (83.3 kg)  11/05/23 184 lb 12.8 oz (83.8 kg)   Body mass index is 29.57 kg/m. Performance status (ECOG): 1 - Symptomatic but completely ambulatory Physical Exam Constitutional:      Appearance: Normal appearance. He is not ill-appearing.  HENT:     Mouth/Throat:     Mouth: Mucous membranes are moist.     Pharynx: Oropharynx is clear. No oropharyngeal exudate or posterior oropharyngeal erythema.  Cardiovascular:     Rate and Rhythm: Normal rate and regular rhythm.     Heart sounds: No murmur heard.    No friction rub. No gallop.  Pulmonary:     Effort: Pulmonary effort is normal. No respiratory distress.     Breath sounds: Normal breath sounds. No  wheezing, rhonchi or rales.  Abdominal:     General: Bowel sounds are normal. There is no distension.     Palpations: Abdomen is soft. There is no mass.     Tenderness: There is no abdominal tenderness.  Musculoskeletal:        General: No swelling.     Right lower leg: No edema.     Left lower leg: No edema.  Lymphadenopathy:     Cervical: No cervical adenopathy.     Upper Body:     Right upper body: No supraclavicular or axillary adenopathy.     Left upper body: No supraclavicular or axillary adenopathy.     Lower Body: No right inguinal adenopathy. No left inguinal adenopathy.  Skin:    General: Skin is warm.     Coloration: Skin is not jaundiced.     Findings: No lesion or rash.  Neurological:     General: No focal deficit present.     Mental Status: He is alert and oriented to person, place, and time. Mental status is at baseline.  Psychiatric:        Mood and Affect: Mood normal.        Behavior: Behavior normal.        Thought Content: Thought content normal.    LABS:      Latest Ref Rng & Units 12/31/2023   11:02 AM 12/03/2023   10:15 AM 11/05/2023    9:49 AM  CBC  WBC  4.0 - 10.5 K/uL 6.6  7.0  4.8   Hemoglobin 13.0 - 17.0 g/dL 62.9  52.8  9.7   Hematocrit 39.0 - 52.0 % 35.7  33.6  30.1   Platelets 150 - 400 K/uL 220  239  252       Latest Ref Rng & Units 12/31/2023   11:02 AM 12/03/2023   10:15 AM 11/05/2023    9:49 AM  CMP  Glucose 70 - 99 mg/dL 96  413  244   BUN 8 - 23 mg/dL 12  15  12    Creatinine 0.61 - 1.24 mg/dL 0.10  2.72  5.36   Sodium 135 - 145 mmol/L 140  137  139   Potassium 3.5 - 5.1 mmol/L 4.1  3.9  3.4   Chloride 98 - 111 mmol/L 103  101  100   CO2 22 - 32 mmol/L 28  28  27    Calcium 8.9 - 10.3 mg/dL 9.9  64.4  03.4   Total Protein 6.5 - 8.1 g/dL 6.8  6.9  6.8   Total Bilirubin 0.0 - 1.2 mg/dL 0.2  0.3  0.3   Alkaline Phos 38 - 126 U/L 139  157  109   AST 15 - 41 U/L 16  18  19    ALT 0 - 44 U/L <5  <5  <5    ASSESSMENT & PLAN:  A 78  y.o. male with limited stage small cell lung cancer.  As mentioned previously, this patient already completed his chemoradiation in October 2024.  He will proceed with his 3rd cycle of maintenance Durvalumab immunotherapy today.  Clinically, the patient looks great.  It appears he is tolerating his immunotherapy very well.  I will see him back in 4 weeks before he heads into his 4th cycle of maintenance Durvalumab.  The patient and his family understand all the plans discussed today and are in agreement with them.   Prajna Vanderpool Kirby Funk, MD

## 2023-12-31 ENCOUNTER — Inpatient Hospital Stay: Payer: HMO

## 2023-12-31 ENCOUNTER — Inpatient Hospital Stay: Payer: HMO | Attending: Oncology | Admitting: Oncology

## 2023-12-31 ENCOUNTER — Other Ambulatory Visit: Payer: Self-pay | Admitting: Oncology

## 2023-12-31 VITALS — BP 113/65 | HR 70 | Temp 98.2°F | Resp 20 | Ht 66.0 in | Wt 183.2 lb

## 2023-12-31 DIAGNOSIS — Z5112 Encounter for antineoplastic immunotherapy: Secondary | ICD-10-CM | POA: Diagnosis present

## 2023-12-31 DIAGNOSIS — C3412 Malignant neoplasm of upper lobe, left bronchus or lung: Secondary | ICD-10-CM

## 2023-12-31 DIAGNOSIS — Z79899 Other long term (current) drug therapy: Secondary | ICD-10-CM | POA: Insufficient documentation

## 2023-12-31 LAB — CBC WITH DIFFERENTIAL (CANCER CENTER ONLY)
Abs Immature Granulocytes: 0.02 10*3/uL (ref 0.00–0.07)
Basophils Absolute: 0 10*3/uL (ref 0.0–0.1)
Basophils Relative: 1 %
Eosinophils Absolute: 0.2 10*3/uL (ref 0.0–0.5)
Eosinophils Relative: 4 %
HCT: 35.7 % — ABNORMAL LOW (ref 39.0–52.0)
Hemoglobin: 11.4 g/dL — ABNORMAL LOW (ref 13.0–17.0)
Immature Granulocytes: 0 %
Lymphocytes Relative: 23 %
Lymphs Abs: 1.5 10*3/uL (ref 0.7–4.0)
MCH: 27 pg (ref 26.0–34.0)
MCHC: 31.9 g/dL (ref 30.0–36.0)
MCV: 84.4 fL (ref 80.0–100.0)
Monocytes Absolute: 0.8 10*3/uL (ref 0.1–1.0)
Monocytes Relative: 12 %
Neutro Abs: 4 10*3/uL (ref 1.7–7.7)
Neutrophils Relative %: 60 %
Platelet Count: 220 10*3/uL (ref 150–400)
RBC: 4.23 MIL/uL (ref 4.22–5.81)
RDW: 16.9 % — ABNORMAL HIGH (ref 11.5–15.5)
WBC Count: 6.6 10*3/uL (ref 4.0–10.5)
nRBC: 0 % (ref 0.0–0.2)
nRBC: 0 /100{WBCs}

## 2023-12-31 LAB — TSH: TSH: 2.69 u[IU]/mL (ref 0.350–4.500)

## 2023-12-31 LAB — CMP (CANCER CENTER ONLY)
ALT: <5 U/L (ref 0–44)
AST: 16 U/L (ref 15–41)
Albumin: 3.8 g/dL (ref 3.5–5.0)
Alkaline Phosphatase: 139 U/L — ABNORMAL HIGH (ref 38–126)
Anion gap: 8 (ref 5–15)
BUN: 12 mg/dL (ref 8–23)
CO2: 28 mmol/L (ref 22–32)
Calcium: 9.9 mg/dL (ref 8.9–10.3)
Chloride: 103 mmol/L (ref 98–111)
Creatinine: 0.98 mg/dL (ref 0.61–1.24)
GFR, Estimated: >60 mL/min (ref >60)
Glucose, Bld: 96 mg/dL (ref 70–99)
Potassium: 4.1 mmol/L (ref 3.5–5.1)
Sodium: 140 mmol/L (ref 135–145)
Total Bilirubin: 0.2 mg/dL (ref 0.0–1.2)
Total Protein: 6.8 g/dL (ref 6.5–8.1)

## 2023-12-31 MED ORDER — HEPARIN SOD (PORK) LOCK FLUSH 100 UNIT/ML IV SOLN: 500.0000 [IU] | Freq: Once | INTRAVENOUS | Status: DC | PRN

## 2023-12-31 MED ORDER — GABAPENTIN 300 MG PO CAPS: 300.0000 mg | ORAL_CAPSULE | Freq: Three times a day (TID) | ORAL | 5 refills | Status: DC

## 2023-12-31 MED ORDER — SODIUM CHLORIDE 0.9 % IV SOLN: INTRAVENOUS | Status: DC

## 2023-12-31 MED ORDER — SODIUM CHLORIDE 0.9% FLUSH: 10.0000 mL | INTRAVENOUS | Status: DC | PRN

## 2023-12-31 MED ORDER — SODIUM CHLORIDE 0.9 % IV SOLN
1500.0000 mg | Freq: Once | INTRAVENOUS | Status: AC
  Administered 2023-12-31: 1500 mg via INTRAVENOUS
  Filled 2023-12-31: qty 30

## 2023-12-31 NOTE — Patient Instructions (Signed)
 Durvalumab Injection What is this medication? DURVALUMAB (dur VAL ue mab) treats some types of cancer. It works by helping your immune system slow or stop the spread of cancer cells. It is a monoclonal antibody. This medicine may be used for other purposes; ask your health care provider or pharmacist if you have questions. COMMON BRAND NAME(S): IMFINZI What should I tell my care team before I take this medication? They need to know if you have any of these conditions: Allogeneic stem cell transplant (uses someone else's stem cells) Autoimmune diseases, such as Crohn disease, ulcerative colitis, lupus History of chest radiation Nervous system problems, such as Guillain-Barre syndrome, myasthenia gravis Organ transplant An unusual or allergic reaction to durvalumab, other medications, foods, dyes, or preservatives Pregnant or trying to get pregnant Breast-feeding How should I use this medication? This medication is infused into a vein. It is given by your care team in a hospital or clinic setting. A special MedGuide will be given to you before each treatment. Be sure to read this information carefully each time. Talk to your care team about the use of this medication in children. Special care may be needed. Overdosage: If you think you have taken too much of this medicine contact a poison control center or emergency room at once. NOTE: This medicine is only for you. Do not share this medicine with others. What if I miss a dose? Keep appointments for follow-up doses. It is important not to miss your dose. Call your care team if you are unable to keep an appointment. What may interact with this medication? Interactions have not been studied. This list may not describe all possible interactions. Give your health care provider a list of all the medicines, herbs, non-prescription drugs, or dietary supplements you use. Also tell them if you smoke, drink alcohol, or use illegal drugs. Some items may  interact with your medicine. What should I watch for while using this medication? Your condition will be monitored carefully while you are receiving this medication. You may need blood work while taking this medication. This medication may cause serious skin reactions. They can happen weeks to months after starting the medication. Contact your care team right away if you notice fevers or flu-like symptoms with a rash. The rash may be red or purple and then turn into blisters or peeling of the skin. You may also notice a red rash with swelling of the face, lips, or lymph nodes in your neck or under your arms. Tell your care team right away if you have any change in your eyesight. Talk to your care team if you may be pregnant. Serious birth defects can occur if you take this medication during pregnancy and for 3 months after the last dose. You will need a negative pregnancy test before starting this medication. Contraception is recommended while taking this medication and for 3 months after the last dose. Your care team can help you find the option that works for you. Do not breastfeed while taking this medication and for 3 months after the last dose. What side effects may I notice from receiving this medication? Side effects that you should report to your care team as soon as possible: Allergic reactions--skin rash, itching, hives, swelling of the face, lips, tongue, or throat Dry cough, shortness of breath or trouble breathing Eye pain, redness, irritation, or discharge with blurry or decreased vision Heart muscle inflammation--unusual weakness or fatigue, shortness of breath, chest pain, fast or irregular heartbeat, dizziness, swelling of the  ankles, feet, or hands Hormone gland problems--headache, sensitivity to light, unusual weakness or fatigue, dizziness, fast or irregular heartbeat, increased sensitivity to cold or heat, excessive sweating, constipation, hair loss, increased thirst or amount of  urine, tremors or shaking, irritability Infusion reactions--chest pain, shortness of breath or trouble breathing, feeling faint or lightheaded Kidney injury (glomerulonephritis)--decrease in the amount of urine, red or dark brown urine, foamy or bubbly urine, swelling of the ankles, hands, or feet Liver injury--right upper belly pain, loss of appetite, nausea, light-colored stool, dark yellow or brown urine, yellowing skin or eyes, unusual weakness or fatigue Pain, tingling, or numbness in the hands or feet, muscle weakness, change in vision, confusion or trouble speaking, loss of balance or coordination, trouble walking, seizures Rash, fever, and swollen lymph nodes Redness, blistering, peeling, or loosening of the skin, including inside the mouth Sudden or severe stomach pain, bloody diarrhea, fever, nausea, vomiting Side effects that usually do not require medical attention (report these to your care team if they continue or are bothersome): Bone, joint, or muscle pain Diarrhea Fatigue Loss of appetite Nausea Skin rash This list may not describe all possible side effects. Call your doctor for medical advice about side effects. You may report side effects to FDA at 1-800-FDA-1088. Where should I keep my medication? This medication is given in a hospital or clinic. It will not be stored at home. NOTE: This sheet is a summary. It may not cover all possible information. If you have questions about this medicine, talk to your doctor, pharmacist, or health care provider.  2024 Elsevier/Gold Standard (2022-03-21 00:00:00)

## 2024-01-01 ENCOUNTER — Encounter: Payer: Self-pay | Admitting: Oncology

## 2024-01-01 LAB — T4: T4, Total: 7.8 ug/dL (ref 4.5–12.0)

## 2024-01-07 ENCOUNTER — Encounter: Payer: Self-pay | Admitting: Oncology

## 2024-01-24 ENCOUNTER — Ambulatory Visit (HOSPITAL_BASED_OUTPATIENT_CLINIC_OR_DEPARTMENT_OTHER)
Admission: RE | Admit: 2024-01-24 | Discharge: 2024-01-24 | Disposition: A | Discharge status: Home / Self Care | Payer: HMO | Source: Ambulatory Visit | Attending: Oncology | Admitting: Oncology

## 2024-01-24 DIAGNOSIS — C3412 Malignant neoplasm of upper lobe, left bronchus or lung: Secondary | ICD-10-CM | POA: Diagnosis not present

## 2024-01-24 MED ORDER — IOHEXOL 300 MG/ML  SOLN
75.0000 mL | Freq: Once | INTRAMUSCULAR | Status: AC | PRN
  Administered 2024-01-24: 75 mL via INTRAVENOUS

## 2024-01-25 ENCOUNTER — Other Ambulatory Visit: Payer: Self-pay

## 2024-01-25 DIAGNOSIS — C3412 Malignant neoplasm of upper lobe, left bronchus or lung: Secondary | ICD-10-CM

## 2024-01-27 NOTE — Progress Notes (Unsigned)
 [facility]  [address] [place],  [place]  [address] [phone]  Clinic Day:  01/28/2024  Referring physician: Gordan Payment., MD   HISTORY OF PRESENT ILLNESS:  The patient is a 78 y.o. male with limited stage small cell lung cancer.  He completed definitive chemoradiation in October 2024 for his disease.  His chemotherapy consisted of 4 cycles of cisplatin/etoposide.  CT scans afterwards showed essentially a complete response to this therapy.  He comes in today to be evaluated before heading into his 4th cycle of maintenance Durvalumab.  The patient claims to have tolerated his 3rd cycle of this agent very well.  Of note, he did undergo a chest CT recently to evaluate his new disease baseline.  He denies having any untoward side effects from his Durvalumab immunotherapy.  He also denies having any respiratory symptoms which concern him for early recurrence of his small cell lung cancer.   PHYSICAL EXAM:  Blood pressure (!) 130/59, pulse 72, temperature 98.2 F (36.8 C), temperature source Oral, resp. rate 18, height 5\' 6"  (1.676 m), weight 181 lb 1.6 oz (82.1 kg), SpO2 91%. Wt Readings from Last 3 Encounters:  01/28/24 181 lb 1.6 oz (82.1 kg)  12/31/23 183 lb 3.2 oz (83.1 kg)  12/03/23 183 lb 9.6 oz (83.3 kg)   Body mass index is 29.23 kg/m. Performance status (ECOG): 1 - Symptomatic but completely ambulatory Physical Exam Constitutional:      Appearance: Normal appearance. He is not ill-appearing.  HENT:     Mouth/Throat:     Mouth: Mucous membranes are moist.     Pharynx: Oropharynx is clear. No oropharyngeal exudate or posterior oropharyngeal erythema.  Cardiovascular:     Rate and Rhythm: Normal rate and regular rhythm.     Heart sounds: No murmur heard.    No friction rub. No gallop.  Pulmonary:     Effort: Pulmonary effort is normal. No respiratory distress.     Breath sounds: Normal breath sounds. No wheezing, rhonchi or rales.   Abdominal:     General: Bowel sounds are normal. There is no distension.     Palpations: Abdomen is soft. There is no mass.     Tenderness: There is no abdominal tenderness.  Musculoskeletal:        General: No swelling.     Right lower leg: No edema.     Left lower leg: No edema.  Lymphadenopathy:     Cervical: No cervical adenopathy.     Upper Body:     Right upper body: No supraclavicular or axillary adenopathy.     Left upper body: No supraclavicular or axillary adenopathy.     Lower Body: No right inguinal adenopathy. No left inguinal adenopathy.  Skin:    General: Skin is warm.     Coloration: Skin is not jaundiced.     Findings: No lesion or rash.  Neurological:     General: No focal deficit present.     Mental Status: He is alert and oriented to person, place, and time. Mental status is at baseline.  Psychiatric:        Mood and Affect: Mood normal.        Behavior: Behavior normal.        Thought Content: Thought content normal.   SCANS: His chest CT revealed the following:  LABS:      Latest Ref Rng & Units 01/28/2024   10:29 AM 12/31/2023   11:02 AM 12/03/2023  10:15 AM  CBC  WBC 4.0 - 10.5 K/uL 7.2  6.6  7.0   Hemoglobin 13.0 - 17.0 g/dL 40.9  81.1  91.4   Hematocrit 39.0 - 52.0 % 36.3  35.7  33.6   Platelets 150 - 400 K/uL 226  220  239       Latest Ref Rng & Units 01/28/2024   10:29 AM 12/31/2023   11:02 AM 12/03/2023   10:15 AM  CMP  Glucose 70 - 99 mg/dL 782  96  956   BUN 8 - 23 mg/dL 18  12  15    Creatinine 0.61 - 1.24 mg/dL 2.13  0.86  5.78   Sodium 135 - 145 mmol/L 140  140  137   Potassium 3.5 - 5.1 mmol/L 4.1  4.1  3.9   Chloride 98 - 111 mmol/L 104  103  101   CO2 22 - 32 mmol/L 26  28  28    Calcium 8.9 - 10.3 mg/dL 9.6  9.9  46.9   Total Protein 6.5 - 8.1 g/dL 6.9  6.8  6.9   Total Bilirubin 0.0 - 1.2 mg/dL 0.3  0.2  0.3   Alkaline Phos 38 - 126 U/L 139  139  157   AST 15 - 41 U/L 17  16  18    ALT 0 - 44 U/L <5  <5  <5    ASSESSMENT &  PLAN:  A 78 y.o. male with limited stage small cell lung cancer.  As mentioned previously, this patient already completed his chemoradiation in October 2024.  In clinic today, I went over his chest CT images with him, for which she could see that a lot of his radiation changes have improved over time.  I do not see any new lung nodules, lymphadenopathy, or disease in the upper abdomen which suggest he has a early recurrence of his small cell carcinoma.  Based upon these positive findings, he will proceed with his 4th cycle of maintenance Durvalumab immunotherapy today.  Clinically, the patient looks very good.  I will see him back in 4 weeks before he heads into his 5th cycle of maintenance Durvalumab.  The patient and his family understand all the plans discussed today and are in agreement with them.   Lynde Ludwig Kirby Funk, MD

## 2024-01-28 ENCOUNTER — Inpatient Hospital Stay: Payer: HMO

## 2024-01-28 ENCOUNTER — Inpatient Hospital Stay: Payer: HMO | Attending: Oncology | Admitting: Oncology

## 2024-01-28 VITALS — BP 130/59 | HR 72 | Temp 98.2°F | Resp 18 | Ht 66.0 in | Wt 181.1 lb

## 2024-01-28 DIAGNOSIS — C3412 Malignant neoplasm of upper lobe, left bronchus or lung: Secondary | ICD-10-CM | POA: Diagnosis not present

## 2024-01-28 DIAGNOSIS — Z5112 Encounter for antineoplastic immunotherapy: Secondary | ICD-10-CM | POA: Insufficient documentation

## 2024-01-28 DIAGNOSIS — Z923 Personal history of irradiation: Secondary | ICD-10-CM | POA: Diagnosis not present

## 2024-01-28 DIAGNOSIS — Z9221 Personal history of antineoplastic chemotherapy: Secondary | ICD-10-CM | POA: Insufficient documentation

## 2024-01-28 LAB — CBC WITH DIFFERENTIAL (CANCER CENTER ONLY)
Abs Immature Granulocytes: 0.04 10*3/uL (ref 0.00–0.07)
Basophils Absolute: 0.1 10*3/uL (ref 0.0–0.1)
Basophils Relative: 1 %
Eosinophils Absolute: 0.2 10*3/uL (ref 0.0–0.5)
Eosinophils Relative: 3 %
HCT: 36.3 % — ABNORMAL LOW (ref 39.0–52.0)
Hemoglobin: 11.3 g/dL — ABNORMAL LOW (ref 13.0–17.0)
Immature Granulocytes: 1 %
Lymphocytes Relative: 21 %
Lymphs Abs: 1.5 10*3/uL (ref 0.7–4.0)
MCH: 26.7 pg (ref 26.0–34.0)
MCHC: 31.1 g/dL (ref 30.0–36.0)
MCV: 85.8 fL (ref 80.0–100.0)
Monocytes Absolute: 0.6 10*3/uL (ref 0.1–1.0)
Monocytes Relative: 9 %
Neutro Abs: 4.8 10*3/uL (ref 1.7–7.7)
Neutrophils Relative %: 65 %
Platelet Count: 226 10*3/uL (ref 150–400)
RBC: 4.23 MIL/uL (ref 4.22–5.81)
RDW: 18 % — ABNORMAL HIGH (ref 11.5–15.5)
WBC Count: 7.2 10*3/uL (ref 4.0–10.5)
nRBC: 0 % (ref 0.0–0.2)
nRBC: 0 /100{WBCs}

## 2024-01-28 LAB — CMP (CANCER CENTER ONLY)
ALT: <5 U/L (ref 0–44)
AST: 17 U/L (ref 15–41)
Albumin: 3.9 g/dL (ref 3.5–5.0)
Alkaline Phosphatase: 139 U/L — ABNORMAL HIGH (ref 38–126)
Anion gap: 9 (ref 5–15)
BUN: 18 mg/dL (ref 8–23)
CO2: 26 mmol/L (ref 22–32)
Calcium: 9.6 mg/dL (ref 8.9–10.3)
Chloride: 104 mmol/L (ref 98–111)
Creatinine: 1.14 mg/dL (ref 0.61–1.24)
GFR, Estimated: >60 mL/min (ref >60)
Glucose, Bld: 119 mg/dL — ABNORMAL HIGH (ref 70–99)
Potassium: 4.1 mmol/L (ref 3.5–5.1)
Sodium: 140 mmol/L (ref 135–145)
Total Bilirubin: 0.3 mg/dL (ref 0.0–1.2)
Total Protein: 6.9 g/dL (ref 6.5–8.1)

## 2024-01-28 MED ORDER — SODIUM CHLORIDE 0.9 % IV SOLN
1500.0000 mg | Freq: Once | INTRAVENOUS | Status: AC
  Administered 2024-01-28: 1500 mg via INTRAVENOUS
  Filled 2024-01-28: qty 30

## 2024-01-28 MED ORDER — SODIUM CHLORIDE 0.9 % IV SOLN: INTRAVENOUS | Status: DC

## 2024-01-28 NOTE — Patient Instructions (Signed)
 [facility] - A DEPT OF [facility]  Discharge Instructions: Thank you for choosing [facility] to provide your oncology and hematology care.  If you have a lab appointment with the Cancer Center, please go directly to the Cancer Center and check in at the registration area.   Wear comfortable clothing and clothing appropriate for easy access to any Portacath or PICC line.   We strive to give you quality time with your provider. You may need to reschedule your appointment if you arrive late (15 or more minutes).  Arriving late affects you and other patients whose appointments are after yours.  Also, if you miss three or more appointments without notifying the office, you may be dismissed from the clinic at the provider's discretion.      For prescription refill requests, have your pharmacy contact our office and allow 72 hours for refills to be completed.    Today you received the following chemotherapy and/or immunotherapy agents DURVALUMABDurvalumab Injection What is this medication? DURVALUMAB (dur VAL ue mab) treats some types of cancer. It works by helping your immune system slow or stop the spread of cancer cells. It is a monoclonal antibody. This medicine may be used for other purposes; ask your health care provider or pharmacist if you have questions. COMMON BRAND NAME(S): IMFINZI What should I tell my care team before I take this medication? They need to know if you have any of these conditions: Allogeneic stem cell transplant (uses someone else's stem cells) Autoimmune diseases, such as Crohn disease, ulcerative colitis, lupus History of chest radiation Nervous system problems, such as Guillain-Barre syndrome, myasthenia gravis Organ transplant An unusual or allergic reaction to durvalumab, other medications, foods, dyes, or preservatives Pregnant or trying to get pregnant Breast-feeding How should I use this medication? This medication  is infused into a vein. It is given by your care team in a hospital or clinic setting. A special MedGuide will be given to you before each treatment. Be sure to read this information carefully each time. Talk to your care team about the use of this medication in children. Special care may be needed. Overdosage: If you think you have taken too much of this medicine contact a poison control center or emergency room at once. NOTE: This medicine is only for you. Do not share this medicine with others. What if I miss a dose? Keep appointments for follow-up doses. It is important not to miss your dose. Call your care team if you are unable to keep an appointment. What may interact with this medication? Interactions have not been studied. This list may not describe all possible interactions. Give your health care provider a list of all the medicines, herbs, non-prescription drugs, or dietary supplements you use. Also tell them if you smoke, drink alcohol, or use illegal drugs. Some items may interact with your medicine. What should I watch for while using this medication? Your condition will be monitored carefully while you are receiving this medication. You may need blood work while taking this medication. This medication may cause serious skin reactions. They can happen weeks to months after starting the medication. Contact your care team right away if you notice fevers or flu-like symptoms with a rash. The rash may be red or purple and then turn into blisters or peeling of the skin. You may also notice a red rash with swelling of the face, lips, or lymph nodes in your neck or under your arms.  Tell your care team right away if you have any change in your eyesight. Talk to your care team if you may be pregnant. Serious birth defects can occur if you take this medication during pregnancy and for 3 months after the last dose. You will need a negative pregnancy test before starting this medication.  Contraception is recommended while taking this medication and for 3 months after the last dose. Your care team can help you find the option that works for you. Do not breastfeed while taking this medication and for 3 months after the last dose. What side effects may I notice from receiving this medication? Side effects that you should report to your care team as soon as possible: Allergic reactions--skin rash, itching, hives, swelling of the face, lips, tongue, or throat Dry cough, shortness of breath or trouble breathing Eye pain, redness, irritation, or discharge with blurry or decreased vision Heart muscle inflammation--unusual weakness or fatigue, shortness of breath, chest pain, fast or irregular heartbeat, dizziness, swelling of the ankles, feet, or hands Hormone gland problems--headache, sensitivity to light, unusual weakness or fatigue, dizziness, fast or irregular heartbeat, increased sensitivity to cold or heat, excessive sweating, constipation, hair loss, increased thirst or amount of urine, tremors or shaking, irritability Infusion reactions--chest pain, shortness of breath or trouble breathing, feeling faint or lightheaded Kidney injury (glomerulonephritis)--decrease in the amount of urine, red or dark brown urine, foamy or bubbly urine, swelling of the ankles, hands, or feet Liver injury--right upper belly pain, loss of appetite, nausea, light-colored stool, dark yellow or brown urine, yellowing skin or eyes, unusual weakness or fatigue Pain, tingling, or numbness in the hands or feet, muscle weakness, change in vision, confusion or trouble speaking, loss of balance or coordination, trouble walking, seizures Rash, fever, and swollen lymph nodes Redness, blistering, peeling, or loosening of the skin, including inside the mouth Sudden or severe stomach pain, bloody diarrhea, fever, nausea, vomiting Side effects that usually do not require medical attention (report these to your care team  if they continue or are bothersome): Bone, joint, or muscle pain Diarrhea Fatigue Loss of appetite Nausea Skin rash This list may not describe all possible side effects. Call your doctor for medical advice about side effects. You may report side effects to FDA at 1-800-FDA-1088. Where should I keep my medication? This medication is given in a hospital or clinic. It will not be stored at home. NOTE: This sheet is a summary. It may not cover all possible information. If you have questions about this medicine, talk to your doctor, pharmacist, or health care provider.  2024 Elsevier/Gold Standard (2022-03-21 00:00:00)      To help prevent nausea and vomiting after your treatment, we encourage you to take your nausea medication as directed.  BELOW ARE SYMPTOMS THAT SHOULD BE REPORTED IMMEDIATELY: *FEVER GREATER THAN 100.4 F (38 C) OR HIGHER *CHILLS OR SWEATING *NAUSEA AND VOMITING THAT IS NOT CONTROLLED WITH YOUR NAUSEA MEDICATION *UNUSUAL SHORTNESS OF BREATH *UNUSUAL BRUISING OR BLEEDING *URINARY PROBLEMS (pain or burning when urinating, or frequent urination) *BOWEL PROBLEMS (unusual diarrhea, constipation, pain near the anus) TENDERNESS IN MOUTH AND THROAT WITH OR WITHOUT PRESENCE OF ULCERS (sore throat, sores in mouth, or a toothache) UNUSUAL RASH, SWELLING OR PAIN  UNUSUAL VAGINAL DISCHARGE OR ITCHING   Items with * indicate a potential emergency and should be followed up as soon as possible or go to the Emergency Department if any problems should occur.  Please show the CHEMOTHERAPY ALERT CARD or IMMUNOTHERAPY ALERT  CARD at check-in to the Emergency Department and triage nurse.  Should you have questions after your visit or need to cancel or reschedule your appointment, please contact [facility] [place] - A DEPT OF [facility]  Dept: [phone]  and follow the prompts.  Office hours are 8:00 a.m. to 4:30 p.m. Monday - Friday. Please note that voicemails  left after 4:00 p.m. may not be returned until the following business day.  We are closed weekends and major holidays. You have access to a nurse at all times for urgent questions. Please call the main number to the clinic Dept: [phone] and follow the prompts.  For any non-urgent questions, you may also contact your provider using MyChart. We now offer e-Visits for anyone 72 and older to request care online for non-urgent symptoms. For details visit [email or web address].   Also download the MyChart app! Go to the app store, search "MyChart", open the app, select [place], and log in with your MyChart username and password.

## 2024-01-29 ENCOUNTER — Other Ambulatory Visit: Payer: Self-pay

## 2024-01-30 ENCOUNTER — Ambulatory Visit: Payer: Medicare HMO | Admitting: Oncology

## 2024-02-07 ENCOUNTER — Telehealth: Payer: Self-pay | Admitting: Oncology

## 2024-02-07 NOTE — Telephone Encounter (Signed)
 02/07/24 Spoke with wife and confirmed Hosp f/u appt.

## 2024-02-11 NOTE — Progress Notes (Unsigned)
 [facility]  [address] [place],  [place]  [address] [phone]  Clinic Day:  02/12/2024  Referring physician: Gordan Payment., MD   HISTORY OF PRESENT ILLNESS:  The patient is a 77 y.o. male with limited stage small cell lung cancer.  He completed definitive chemoradiation in October 2024 for his disease.  His chemotherapy consisted of 4 cycles of cisplatin/etoposide.  CT scans afterwards showed essentially a complete response to this therapy.  He recently completed his 4th cycle of maintenance Durvalumab.  He comes in today after being discharged from the hospital for pneumonia.  While in the hospital, CT scans were done, for which there was some question about potential disease recurrence.  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  However, he comes in today 1 week after being discharged to will reassess his status.  Overall, the patient claims to be doing very well.  He coughs only minimally currently.  His wife is deftly noticed an improvement in how he has been doing over the past 24 hours.  PHYSICAL EXAM:  Blood pressure 117/68, pulse (!) 58, temperature 98.2 F (36.8 C), temperature source Oral, resp. rate 18, height 5\' 6"  (1.676 m), weight 177 lb 4.8 oz (80.4 kg), SpO2 97%. Wt Readings from Last 3 Encounters:  02/12/24 177 lb 4.8 oz (80.4 kg)  01/28/24 181 lb 1.6 oz (82.1 kg)  12/31/23 183 lb 3.2 oz (83.1 kg)   Body mass index is 28.62 kg/m. Performance status (ECOG): 1 - Symptomatic but completely ambulatory Physical Exam Constitutional:      Appearance: Normal appearance. He is not ill-appearing.  HENT:     Mouth/Throat:     Mouth: Mucous membranes are moist.     Pharynx: Oropharynx is clear. No oropharyngeal exudate or posterior oropharyngeal erythema.  Cardiovascular:     Rate and Rhythm: Normal rate and regular rhythm.     Heart sounds: No murmur heard.    No friction rub. No gallop.   Pulmonary:     Effort: Pulmonary effort is normal. No respiratory distress.     Breath sounds: Normal breath sounds. No wheezing, rhonchi or rales.  Abdominal:     General: Bowel sounds are normal. There is no distension.     Palpations: Abdomen is soft. There is no mass.     Tenderness: There is no abdominal tenderness.  Musculoskeletal:        General: No swelling.     Right lower leg: No edema.     Left lower leg: No edema.  Lymphadenopathy:     Cervical: No cervical adenopathy.     Upper Body:     Right upper body: No supraclavicular or axillary adenopathy.     Left upper body: No supraclavicular or axillary adenopathy.     Lower Body: No right inguinal adenopathy. No left inguinal adenopathy.  Skin:    General: Skin is warm.     Coloration: Skin is not jaundiced.     Findings: No lesion or rash.  Neurological:     General: No focal deficit present.     Mental Status: He is alert and oriented to person, place, and time. Mental status is at baseline.  Psychiatric:        Mood and Affect: Mood normal.        Behavior: Behavior normal.        Thought Content: Thought content normal.   SCANS: His chest CT on  March 16th  revealed the following: FINDINGS: Lungs/pleura: Since 10/27/2023, no significant change in the spiculated nodular mass in the left upper lobe measuring up to 1.8 cm, which previously demonstrated hypermetabolism on PET-CT. Further slight interval increase in patchy and reticular airspace consolidation elsewhere throughout the left upper lobe, and superior segment of the left lower lobe predominantly about the left perihilar region, with associated peribronchial thickening. This may be infectious/inflammatory or neoplastic. The right lung remains clear without focal nodules or consolidation. 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 A trace left pleural effusion is new/increased from prior exam. No pneumothorax. Patent central  airways.  Mediastinum/nodes: No enlarged mediastinal or hilar lymph nodes. Multiple nonspecific subcentimeter mediastinal and left hilar lymph nodes, no significant change from prior exam. Scattered subcentimeter calcified mediastinal lymph nodes, likely sequelae of prior granulomatous process.  Heart and Vasculature: Normal heart size. No pericardial effusion. Mild aortic root and coronary artery calcifications. Abdominal aorta and central pulmonary arteries are normal in course and caliber. Mild-moderate atherosclerotic disease. No central pulmonary arterial emboli.  Upper Abdomen: No significant abnormality. Several subcentimeter calcified granulomata throughout the liver and spleen, likely sequelae of a prior granulomatous process.  Bones: No acute abnormality. No suspicious osseous lesion. Multiple old rib fracture deformities. Stable chronic mild compression fracture deformity of T6 inferior endplate with sclerotic change and no retropulsion. Prominent Schmorl's node formation at the superior endplate of T8 vertebral body. Qualitative osteopenia.  IMPRESSION: 1. Unchanged 1.8 cm known spiculated mass in the left upper lobe. 2. Interval increased patchy and reticular consolidation in the surrounding left upper lobe and superior segment of left lower lobe mainly in the left perihilar region; this may be infectious or neoplastic. 3. New/increased small left-sided pleural effusion. 4. No enlarged mediastinal or hilar lymph nodes; multiple subcentimeter nodes are nonspecific.  LABS:      Latest Ref Rng & Units 02/12/2024    8:48 AM 01/28/2024   10:29 AM 12/31/2023   11:02 AM  CBC  WBC 4.0 - 10.5 K/uL 8.8  7.2  6.6   Hemoglobin 13.0 - 17.0 g/dL 16.1  09.6  04.5   Hematocrit 39.0 - 52.0 % 37.7  36.3  35.7   Platelets 150 - 400 K/uL 223  226  220       Latest Ref Rng & Units 02/12/2024    8:48 AM 01/28/2024   10:29 AM 12/31/2023   11:02 AM  CMP  Glucose 70 - 99 mg/dL 409  811  96   BUN 8  - 23 mg/dL 21  18  12    Creatinine 0.61 - 1.24 mg/dL 9.14  7.82  9.56   Sodium 135 - 145 mmol/L 138  140  140   Potassium 3.5 - 5.1 mmol/L 4.1  4.1  4.1   Chloride 98 - 111 mmol/L 101  104  103   CO2 22 - 32 mmol/L 28  26  28    Calcium 8.9 - 10.3 mg/dL 21.3  9.6  9.9   Total Protein 6.5 - 8.1 g/dL 7.1  6.9  6.8   Total Bilirubin 0.0 - 1.2 mg/dL 0.3  0.3  0.2   Alkaline Phos 38 - 126 U/L 127  139  139   AST 15 - 41 U/L 13  17  16    ALT 0 - 44 U/L <5  <5  <5    ASSESSMENT & PLAN:  A 78 y.o. male with limited stage small cell lung cancer.  As mentioned previously, this patient  already completed his chemoradiation in October 2024.  In clinic today, I went over his most recent chest CT images with him, which were done per his hospitalization earlier this month.  I still do not see any convincing findings of disease recurrence.  The patient understands his left lung will continue to be followed over time to ensure no new changes have developed to where a biopsy would need to be repeated to check for disease recurrence.  Clinically, he looks very good today.  I will see him back in 2 weeks before he heads into his fifth cycle of maintenance Durvalumab immunotherapy.  The patient and his family understand all the plans discussed today and are in agreement with them.   Aaisha Sliter Kirby Funk, MD

## 2024-02-12 ENCOUNTER — Inpatient Hospital Stay

## 2024-02-12 ENCOUNTER — Inpatient Hospital Stay: Admitting: Oncology

## 2024-02-12 VITALS — BP 117/68 | HR 58 | Temp 98.2°F | Resp 18 | Ht 66.0 in | Wt 177.3 lb

## 2024-02-12 DIAGNOSIS — C3412 Malignant neoplasm of upper lobe, left bronchus or lung: Secondary | ICD-10-CM

## 2024-02-12 DIAGNOSIS — Z5112 Encounter for antineoplastic immunotherapy: Secondary | ICD-10-CM | POA: Diagnosis not present

## 2024-02-12 LAB — CBC WITH DIFFERENTIAL (CANCER CENTER ONLY)
Abs Immature Granulocytes: 0.06 10*3/uL (ref 0.00–0.07)
Basophils Absolute: 0.1 10*3/uL (ref 0.0–0.1)
Basophils Relative: 1 %
Eosinophils Absolute: 0.1 10*3/uL (ref 0.0–0.5)
Eosinophils Relative: 1 %
HCT: 37.7 % — ABNORMAL LOW (ref 39.0–52.0)
Hemoglobin: 11.7 g/dL — ABNORMAL LOW (ref 13.0–17.0)
Immature Granulocytes: 1 %
Lymphocytes Relative: 12 %
Lymphs Abs: 1 10*3/uL (ref 0.7–4.0)
MCH: 26.8 pg (ref 26.0–34.0)
MCHC: 31 g/dL (ref 30.0–36.0)
MCV: 86.5 fL (ref 80.0–100.0)
Monocytes Absolute: 0.7 10*3/uL (ref 0.1–1.0)
Monocytes Relative: 8 %
Neutro Abs: 6.9 10*3/uL (ref 1.7–7.7)
Neutrophils Relative %: 77 %
Platelet Count: 223 10*3/uL (ref 150–400)
RBC: 4.36 MIL/uL (ref 4.22–5.81)
RDW: 18.3 % — ABNORMAL HIGH (ref 11.5–15.5)
WBC Count: 8.8 10*3/uL (ref 4.0–10.5)
nRBC: 0 % (ref 0.0–0.2)
nRBC: 0 /100{WBCs}

## 2024-02-12 LAB — CMP (CANCER CENTER ONLY)
ALT: <5 U/L (ref 0–44)
AST: 13 U/L — ABNORMAL LOW (ref 15–41)
Albumin: 4.1 g/dL (ref 3.5–5.0)
Alkaline Phosphatase: 127 U/L — ABNORMAL HIGH (ref 38–126)
Anion gap: 9 (ref 5–15)
BUN: 21 mg/dL (ref 8–23)
CO2: 28 mmol/L (ref 22–32)
Calcium: 10.1 mg/dL (ref 8.9–10.3)
Chloride: 101 mmol/L (ref 98–111)
Creatinine: 0.94 mg/dL (ref 0.61–1.24)
GFR, Estimated: >60 mL/min (ref >60)
Glucose, Bld: 122 mg/dL — ABNORMAL HIGH (ref 70–99)
Potassium: 4.1 mmol/L (ref 3.5–5.1)
Sodium: 138 mmol/L (ref 135–145)
Total Bilirubin: 0.3 mg/dL (ref 0.0–1.2)
Total Protein: 7.1 g/dL (ref 6.5–8.1)

## 2024-02-25 ENCOUNTER — Ambulatory Visit

## 2024-02-25 ENCOUNTER — Encounter: Payer: Self-pay | Admitting: Physician Assistant

## 2024-02-25 ENCOUNTER — Other Ambulatory Visit

## 2024-02-25 ENCOUNTER — Inpatient Hospital Stay: Admitting: Physician Assistant

## 2024-02-25 ENCOUNTER — Inpatient Hospital Stay

## 2024-02-25 ENCOUNTER — Inpatient Hospital Stay: Attending: Oncology

## 2024-02-25 ENCOUNTER — Ambulatory Visit: Admitting: Oncology

## 2024-02-25 VITALS — BP 125/66 | HR 66 | Temp 97.9°F | Resp 18 | Ht 66.0 in | Wt 178.5 lb

## 2024-02-25 DIAGNOSIS — Z801 Family history of malignant neoplasm of trachea, bronchus and lung: Secondary | ICD-10-CM | POA: Insufficient documentation

## 2024-02-25 DIAGNOSIS — E7800 Pure hypercholesterolemia, unspecified: Secondary | ICD-10-CM | POA: Insufficient documentation

## 2024-02-25 DIAGNOSIS — Z79899 Other long term (current) drug therapy: Secondary | ICD-10-CM | POA: Insufficient documentation

## 2024-02-25 DIAGNOSIS — Z87891 Personal history of nicotine dependence: Secondary | ICD-10-CM | POA: Insufficient documentation

## 2024-02-25 DIAGNOSIS — I10 Essential (primary) hypertension: Secondary | ICD-10-CM | POA: Insufficient documentation

## 2024-02-25 DIAGNOSIS — Z5112 Encounter for antineoplastic immunotherapy: Secondary | ICD-10-CM | POA: Insufficient documentation

## 2024-02-25 DIAGNOSIS — C3412 Malignant neoplasm of upper lobe, left bronchus or lung: Secondary | ICD-10-CM | POA: Diagnosis not present

## 2024-02-25 DIAGNOSIS — T451X5A Adverse effect of antineoplastic and immunosuppressive drugs, initial encounter: Secondary | ICD-10-CM | POA: Diagnosis not present

## 2024-02-25 DIAGNOSIS — J4489 Other specified chronic obstructive pulmonary disease: Secondary | ICD-10-CM | POA: Diagnosis not present

## 2024-02-25 DIAGNOSIS — R739 Hyperglycemia, unspecified: Secondary | ICD-10-CM | POA: Insufficient documentation

## 2024-02-25 DIAGNOSIS — R5383 Other fatigue: Secondary | ICD-10-CM | POA: Insufficient documentation

## 2024-02-25 DIAGNOSIS — G620 Drug-induced polyneuropathy: Secondary | ICD-10-CM | POA: Insufficient documentation

## 2024-02-25 DIAGNOSIS — I2510 Atherosclerotic heart disease of native coronary artery without angina pectoris: Secondary | ICD-10-CM | POA: Insufficient documentation

## 2024-02-25 DIAGNOSIS — K219 Gastro-esophageal reflux disease without esophagitis: Secondary | ICD-10-CM | POA: Insufficient documentation

## 2024-02-25 DIAGNOSIS — Z7951 Long term (current) use of inhaled steroids: Secondary | ICD-10-CM | POA: Diagnosis not present

## 2024-02-25 DIAGNOSIS — E538 Deficiency of other specified B group vitamins: Secondary | ICD-10-CM | POA: Diagnosis not present

## 2024-02-25 DIAGNOSIS — Z85828 Personal history of other malignant neoplasm of skin: Secondary | ICD-10-CM | POA: Diagnosis not present

## 2024-02-25 DIAGNOSIS — M19011 Primary osteoarthritis, right shoulder: Secondary | ICD-10-CM | POA: Diagnosis not present

## 2024-02-25 DIAGNOSIS — Z800 Family history of malignant neoplasm of digestive organs: Secondary | ICD-10-CM | POA: Diagnosis not present

## 2024-02-25 DIAGNOSIS — I493 Ventricular premature depolarization: Secondary | ICD-10-CM | POA: Insufficient documentation

## 2024-02-25 DIAGNOSIS — D509 Iron deficiency anemia, unspecified: Secondary | ICD-10-CM | POA: Diagnosis not present

## 2024-02-25 LAB — CMP (CANCER CENTER ONLY)
ALT: <5 U/L (ref 0–44)
AST: 12 U/L — ABNORMAL LOW (ref 15–41)
Albumin: 4.2 g/dL (ref 3.5–5.0)
Alkaline Phosphatase: 130 U/L — ABNORMAL HIGH (ref 38–126)
Anion gap: 12 (ref 5–15)
BUN: 20 mg/dL (ref 8–23)
CO2: 26 mmol/L (ref 22–32)
Calcium: 9.4 mg/dL (ref 8.9–10.3)
Chloride: 100 mmol/L (ref 98–111)
Creatinine: 0.96 mg/dL (ref 0.61–1.24)
GFR, Estimated: >60 mL/min (ref >60)
Glucose, Bld: 245 mg/dL — ABNORMAL HIGH (ref 70–99)
Potassium: 4.2 mmol/L (ref 3.5–5.1)
Sodium: 137 mmol/L (ref 135–145)
Total Bilirubin: 0.2 mg/dL (ref 0.0–1.2)
Total Protein: 7 g/dL (ref 6.5–8.1)

## 2024-02-25 LAB — CBC WITH DIFFERENTIAL (CANCER CENTER ONLY)
Abs Immature Granulocytes: 0.03 10*3/uL (ref 0.00–0.07)
Basophils Absolute: 0 10*3/uL (ref 0.0–0.1)
Basophils Relative: 0 %
Eosinophils Absolute: 0 10*3/uL (ref 0.0–0.5)
Eosinophils Relative: 0 %
HCT: 36.2 % — ABNORMAL LOW (ref 39.0–52.0)
Hemoglobin: 11.8 g/dL — ABNORMAL LOW (ref 13.0–17.0)
Immature Granulocytes: 0 %
Lymphocytes Relative: 14 %
Lymphs Abs: 1.1 10*3/uL (ref 0.7–4.0)
MCH: 28 pg (ref 26.0–34.0)
MCHC: 32.6 g/dL (ref 30.0–36.0)
MCV: 85.8 fL (ref 80.0–100.0)
Monocytes Absolute: 0.6 10*3/uL (ref 0.1–1.0)
Monocytes Relative: 8 %
Neutro Abs: 6.2 10*3/uL (ref 1.7–7.7)
Neutrophils Relative %: 78 %
Platelet Count: 207 10*3/uL (ref 150–400)
RBC: 4.22 MIL/uL (ref 4.22–5.81)
RDW: 17.9 % — ABNORMAL HIGH (ref 11.5–15.5)
WBC Count: 8 10*3/uL (ref 4.0–10.5)
nRBC: 0 % (ref 0.0–0.2)
nRBC: 0 /100{WBCs}

## 2024-02-25 MED ORDER — SODIUM CHLORIDE 0.9 % IV SOLN
INTRAVENOUS | Status: DC
Start: 2024-02-25 — End: 2024-02-25

## 2024-02-25 MED ORDER — SODIUM CHLORIDE 0.9 % IV SOLN
1500.0000 mg | Freq: Once | INTRAVENOUS | Status: AC
  Administered 2024-02-25: 1500 mg via INTRAVENOUS
  Filled 2024-02-25: qty 30

## 2024-02-25 NOTE — Progress Notes (Signed)
 [facility]    PROGRESS NOTE  Patient Care Team: Gordan Payment., MD as PCP - General (Internal Medicine) Revankar, Aundra Dubin, MD as PCP - Cardiology (Cardiology) Gordan Payment., MD (Internal Medicine) Marcellus Scott, MD as Referring Physician (Specialist)   CHIEF COMPLAINTS/PURPOSE OF CONSULTATION:  Limited stage small cell lung cancer   Oncology History  Small cell lung cancer, left upper lobe (HCC)  06/05/2023 Initial Diagnosis   Small cell lung cancer, left upper lobe (HCC)   06/18/2023 - 08/23/2023 Chemotherapy   Patient is on Treatment Plan : LUNG SMALL CELL Cisplatin (80) D1 + Etoposide (100) D1-3 q21d     10/30/2023 - 10/30/2023 Chemotherapy   Patient is on Treatment Plan : LUNG NSCLC Durvalumab (1500) q28d     11/05/2023 -  Chemotherapy   Patient is on Treatment Plan : LUNG SCLC Durvalumab (1500) q28d       HISTORY OF PRESENTING ILLNESS:  Manuel Sanchez 78 y.o. male returns for follow-up for limited stage small cell lung cancer.  He presents today prior to cycle 5, Day 1 of maintenance Durvalumab.  He is accompanied by his wife for this visit.  On exam today, Mr. Tibbitts reports that he is doing well without any new or concerning symptoms.  His energy levels are fairly stable and he continues to complete his ADLs on his own.  His appetite is unchanged without any weight loss.  He denies nausea, vomiting or bowel habit changes.  He reports having a chronic cough that is unchanged without any infectious symptoms.  He he denies easy bruising or signs of active bleeding.  He he reports stable shortness of breath with exertion.  He has persistent neuropathy involving his fingertips and feet.  He was recently switched to Lyrica approximately 4 weeks ago with minimal improvement. He is otherwise doing well and would like to proceed with the planned treatment today.  He denies fevers, chills, sweats, chest pain, headaches or dizziness.  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 BMI 29.19   COPD (chronic obstructive pulmonary disease) (HCC) 07/23/2019   Coronary artery disease involving native coronary artery of native heart without angina pectoris 04/22/2015   Formatting of this note might be different from the original. Calcification noted on CT of his chest in 2016 Formatting of this note might be different from the original. 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 Bernadette Hoit in past.  Will see again.   Malaise and fatigue 04/24/2016   Mass of lower lobe of left lung 09/10/2019   Formatting of this note might be different from the original. Will need a follow up CT Pt aware   Mediastinal adenopathy 05/29/2023   Other nonspecific abnormal finding of lung field 04/11/2023   2.3 cm CT [facility] 03/2023.   Lymphadenopathy     Peripheral vascular disease (HCC) 04/24/2016   Formatting of this note might be different from the original. 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   Skin cancer of nose 07/23/2019   Vitamin B12 deficiency 07/23/2019    SURGICAL HISTORY: Past Surgical History:  Procedure Laterality Date   COLONOSCOPY W/ POLYPECTOMY     TYMPAMOPLASTY Left     SOCIAL HISTORY: Social History   Socioeconomic History   Marital status: Married    Spouse name: BECKY   Number of children: 4   Years of education: 11   Highest education level: Not on file  Occupational History   Occupation: RETIRED [employer]  Tobacco Use   Smoking status: Former    Current packs/day: 0.00    Average packs/day: 0.8 packs/day for 45.0 years (33.8 ttl pk-yrs)    Types: Cigarettes    Start date: 11/20/1957    Quit date: 11/20/2002    Years since quitting: 21.2   Smokeless tobacco: Never  Vaping Use   Vaping status: Never Used  Substance and Sexual Activity   Alcohol use: No   Drug use: No   Sexual activity: Not Currently  Other Topics Concern   Not on file  Social History Narrative   ** Merged History Encounter **       Social Drivers of Health   Financial Resource Strain: Not on file  Food Insecurity: Low Risk  (08/13/2023)   Received from [facility]   Hunger Vital Sign    Worried About Running Out of Food in the Last Year: Never true    Ran Out of Food in the Last Year: Never true  Transportation Needs: No Transportation Needs (08/13/2023)   Received from [facility]    In the past 12 months, has lack of reliable transportation kept you from medical appointments, meetings, work or from getting things needed for daily living? : No  Physical Activity: Not on file  Stress: Not on file  Social Connections: Unknown  (05/15/2023)   Received from [facility]   Social Network    Social Network: Not on file  Intimate Partner Violence: Unknown (05/15/2023)   Received from [facility], [facility]   HITS    Physically Hurt: Not on file    Insult or Talk Down To: Not on file    Threaten Physical Harm: Not on file    Scream or Curse: Not on file    FAMILY HISTORY: Family History  Problem Relation Age of Onset   Heart disease Mother    Stroke Mother    Hypertension Mother    Heart attack Mother    Hearing loss Mother    Colon cancer Father    Stomach cancer Sister    Lung cancer Sister     ALLERGIES:  is allergic to codeine, levofloxacin, and levofloxacin in d5w.  MEDICATIONS:  Current Outpatient Medications  Medication Sig Dispense Refill   albuterol (ACCUNEB) 0.63 MG/3ML nebulizer solution Inhale 3 mLs into the lungs every 6 (six) hours as needed for wheezing.     albuterol (VENTOLIN HFA) 108 (90 BASE) MCG/ACT inhaler Inhale 2 puffs into the lungs every 4 (four) hours as needed for wheezing or shortness of breath.      atorvastatin (LIPITOR) 40 MG tablet Take 40 mg by mouth daily.     budesonide-formoterol (SYMBICORT) 160-4.5 MCG/ACT inhaler Inhale 2 puffs into the lungs at bedtime.     Calcium Carb-Cholecalciferol (OYSTER SHELL CALCIUM W/D) 500-5 MG-MCG TABS Take 1 tablet by mouth daily.     cholecalciferol (VITAMIN D) 1000 UNITS tablet Take 1,000 Units by mouth daily.     co-enzyme Q-10 30 MG capsule Take 30 mg by mouth daily.     Cyanocobalamin (B-12) 2500 MCG TABS Take 1 tablet by mouth every morning.     docusate sodium (COLACE) 100 MG capsule Take 100 mg by mouth at bedtime.     ferrous sulfate 325 (65 FE) MG EC tablet Take 325 mg by mouth daily with breakfast.     fluticasone (FLONASE) 50 MCG/ACT nasal spray Place 2 sprays into both nostrils daily.     gabapentin (NEURONTIN) 300 MG capsule Take 1 capsule (300 mg total) by mouth 3 (three) times daily. 90 capsule 5    ipratropium-albuterol (DUONEB) 0.5-2.5 (3) MG/3ML SOLN Take 3 mLs by nebulization every 6 (six) hours.     loperamide (IMODIUM A-D) 2 MG tablet Take 2 mg by mouth as directed. Take 2 tabs after initial diarrhea episode very morning, then 1 tab after each diarrhea thereafter, up to 8 tabs per day, while on immunotherapy.     magic mouthwash SOLN Take 5 mLs by mouth. Called into [facility] 07-05-2023.     montelukast (SINGULAIR) 10 MG tablet Take 10 mg by mouth at bedtime.     mupirocin ointment (BACTROBAN) 2 % Apply 1 Application topically 2 (two) times daily.     nitroGLYCERIN (NITROSTAT) 0.4 MG SL tablet Place 0.4 mg under the tongue every 5 (five) minutes as needed.     ondansetron (ZOFRAN) 8 MG tablet Take 8 mg by mouth every 8 (eight) hours as needed.     pantoprazole (PROTONIX) 40 MG tablet Take 40 mg by mouth daily.     pregabalin (LYRICA) 50 MG capsule Take 50 mg by mouth 3 (three) times daily.     prochlorperazine (COMPAZINE) 10 MG tablet Take 10 mg by mouth every 6 (six) hours as needed.     zinc gluconate 3.75 mg/mL SOLN Take 1 tablet by mouth every morning.     No current facility-administered medications for this visit.    REVIEW OF SYSTEMS:   Constitutional: ( - ) fevers, ( - )  chills , ( - ) night sweats Eyes: ( - ) blurriness of vision, ( - ) double vision, ( - ) watery eyes Ears, nose, mouth, throat, and face: ( - ) mucositis, ( - ) sore throat Respiratory: ( - ) cough, ( - ) dyspnea, ( - ) wheezes Cardiovascular: ( - ) palpitation, ( - ) chest discomfort, ( - ) lower extremity swelling Gastrointestinal:  ( - ) nausea, ( - ) heartburn, ( - ) change in bowel habits Skin: ( - ) abnormal skin rashes Lymphatics: ( - ) new lymphadenopathy, ( - ) easy bruising Neurological: ( +) numbness, ( + ) tingling, ( - )  new weaknesses Behavioral/Psych: ( - ) mood change, ( - ) new changes  All other systems were reviewed with the patient and are negative.  PHYSICAL EXAMINATION: ECOG  PERFORMANCE STATUS: 1 - Symptomatic but completely ambulatory  Vitals:   02/25/24 1416  BP: 125/66  Pulse: 66  Resp: 18  Temp: 97.9 F (36.6 C)  SpO2: 96%   Filed Weights   02/25/24 1416  Weight: 178 lb 8 oz (81 kg)    GENERAL: well appearing male in NAD  SKIN: skin color, texture, turgor are normal, no rashes or significant lesions EYES: conjunctiva are pink and non-injected, sclera clear LUNGS: clear to auscultation and percussion with normal breathing effort HEART: regular rate & rhythm and no murmurs and no lower extremity edema Musculoskeletal: no cyanosis of digits and no clubbing  PSYCH: alert & oriented x 3, fluent speech NEURO: no focal motor/sensory deficits  LABORATORY DATA:  I have reviewed the data as listed    Latest Ref Rng & Units 02/25/2024    2:03 PM 02/12/2024    8:48 AM 01/28/2024   10:29 AM  CBC  WBC 4.0 - 10.5 K/uL 8.0  8.8  7.2   Hemoglobin 13.0 - 17.0 g/dL 29.5  28.4  13.2   Hematocrit 39.0 - 52.0 % 36.2  37.7  36.3   Platelets 150 - 400 K/uL 207  223  226        Latest Ref Rng & Units 02/12/2024    8:48 AM 01/28/2024   10:29 AM 12/31/2023   11:02 AM  CMP  Glucose 70 - 99 mg/dL 440  102  96   BUN 8 - 23 mg/dL 21  18  12    Creatinine 0.61 - 1.24 mg/dL 7.25  3.66  4.40   Sodium 135 - 145 mmol/L 138  140  140   Potassium 3.5 - 5.1 mmol/L 4.1  4.1  4.1   Chloride 98 - 111 mmol/L 101  104  103   CO2 22 - 32 mmol/L 28  26  28    Calcium 8.9 - 10.3 mg/dL 34.7  9.6  9.9   Total Protein 6.5 - 8.1 g/dL 7.1  6.9  6.8   Total Bilirubin 0.0 - 1.2 mg/dL 0.3  0.3  0.2   Alkaline Phos 38 - 126 U/L 127  139  139   AST 15 - 41 U/L 13  17  16    ALT 0 - 44 U/L <5  <5  <5    ASSESSMENT & PLAN ADRON GEISEL is a 78 y.o. male who presents to the clinic for continued management for limited stage small cell lung cancer.  #Limited stage small cell lung cancer: -- Completed definitive chemoradiation in October 2024. -- Received 4 cycles of chemotherapy with  cisplatin and etoposide from 06/18/2023-08/22/2023. -- Started maintenance Durvalumab on 11/05/2023. --Most recent CT chest from 01/24/2024 showed radiation changes with stable left upper lobe nodule.  No evidence of recurrence or metastatic disease at this time. PLAN: --Due for Cycle 5 of Durvalumab today. -- Labs from today were reviewed and appropriate for treatment.  WBC 8.0, hemoglobin 11.8, platelet 207K.  Creatinine LFTs in range. -- Proceed with treatment today without any dose modifications. -- RTC in 3 weeks with labs and follow-up with Dr. Melvyn Neth before cycle 6.  #Hyperglycemia: --Glucose levels 245 today, which is higher than his baseline. -- Need to monitor closely at next visit and consider follow-up with PCP to check A1c levels.  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  No interference with balance but some difficulty with grip. --Patient was previously on gabapentin 300 mg 3 times daily but was switched to Lyrica 50 mg 3 times daily with minimal improvement.  Advised patient to try Lyrica for 3 months before dose modification.  No orders of the defined types were placed in this encounter.   All questions were answered. The patient knows to call the clinic with any problems, questions or concerns.  I have spent a total of 30 minutes minutes of face-to-face and non-face-to-face time, preparing to see the patient, performing a medically appropriate examination, counseling and educating the patient, documenting clinical information in the electronic health record, independently interpreting results and communicating results to the patient, and care coordination.   Georga Kaufmann, PA-C Department of Hematology/Oncology [facility] at [facility]

## 2024-02-25 NOTE — Patient Instructions (Signed)
 Durvalumab Injection What is this medication? DURVALUMAB (dur VAL ue mab) treats some types of cancer. It works by helping your immune system slow or stop the spread of cancer cells. It is a monoclonal antibody. This medicine may be used for other purposes; ask your health care provider or pharmacist if you have questions. COMMON BRAND NAME(S): IMFINZI What should I tell my care team before I take this medication? They need to know if you have any of these conditions: Allogeneic stem cell transplant (uses someone else's stem cells) Autoimmune diseases, such as Crohn disease, ulcerative colitis, lupus History of chest radiation Nervous system problems, such as Guillain-Barre syndrome, myasthenia gravis Organ transplant An unusual or allergic reaction to durvalumab, other medications, foods, dyes, or preservatives Pregnant or trying to get pregnant Breast-feeding How should I use this medication? This medication is infused into a vein. It is given by your care team in a hospital or clinic setting. A special MedGuide will be given to you before each treatment. Be sure to read this information carefully each time. Talk to your care team about the use of this medication in children. Special care may be needed. Overdosage: If you think you have taken too much of this medicine contact a poison control center or emergency room at once. NOTE: This medicine is only for you. Do not share this medicine with others. What if I miss a dose? Keep appointments for follow-up doses. It is important not to miss your dose. Call your care team if you are unable to keep an appointment. What may interact with this medication? Interactions have not been studied. This list may not describe all possible interactions. Give your health care provider a list of all the medicines, herbs, non-prescription drugs, or dietary supplements you use. Also tell them if you smoke, drink alcohol, or use illegal drugs. Some items may  interact with your medicine. What should I watch for while using this medication? Your condition will be monitored carefully while you are receiving this medication. You may need blood work while taking this medication. This medication may cause serious skin reactions. They can happen weeks to months after starting the medication. Contact your care team right away if you notice fevers or flu-like symptoms with a rash. The rash may be red or purple and then turn into blisters or peeling of the skin. You may also notice a red rash with swelling of the face, lips, or lymph nodes in your neck or under your arms. Tell your care team right away if you have any change in your eyesight. Talk to your care team if you may be pregnant. Serious birth defects can occur if you take this medication during pregnancy and for 3 months after the last dose. You will need a negative pregnancy test before starting this medication. Contraception is recommended while taking this medication and for 3 months after the last dose. Your care team can help you find the option that works for you. Do not breastfeed while taking this medication and for 3 months after the last dose. What side effects may I notice from receiving this medication? Side effects that you should report to your care team as soon as possible: Allergic reactions--skin rash, itching, hives, swelling of the face, lips, tongue, or throat Dry cough, shortness of breath or trouble breathing Eye pain, redness, irritation, or discharge with blurry or decreased vision Heart muscle inflammation--unusual weakness or fatigue, shortness of breath, chest pain, fast or irregular heartbeat, dizziness, swelling of the  ankles, feet, or hands Hormone gland problems--headache, sensitivity to light, unusual weakness or fatigue, dizziness, fast or irregular heartbeat, increased sensitivity to cold or heat, excessive sweating, constipation, hair loss, increased thirst or amount of  urine, tremors or shaking, irritability Infusion reactions--chest pain, shortness of breath or trouble breathing, feeling faint or lightheaded Kidney injury (glomerulonephritis)--decrease in the amount of urine, red or dark brown urine, foamy or bubbly urine, swelling of the ankles, hands, or feet Liver injury--right upper belly pain, loss of appetite, nausea, light-colored stool, dark yellow or brown urine, yellowing skin or eyes, unusual weakness or fatigue Pain, tingling, or numbness in the hands or feet, muscle weakness, change in vision, confusion or trouble speaking, loss of balance or coordination, trouble walking, seizures Rash, fever, and swollen lymph nodes Redness, blistering, peeling, or loosening of the skin, including inside the mouth Sudden or severe stomach pain, bloody diarrhea, fever, nausea, vomiting Side effects that usually do not require medical attention (report these to your care team if they continue or are bothersome): Bone, joint, or muscle pain Diarrhea Fatigue Loss of appetite Nausea Skin rash This list may not describe all possible side effects. Call your doctor for medical advice about side effects. You may report side effects to FDA at 1-800-FDA-1088. Where should I keep my medication? This medication is given in a hospital or clinic. It will not be stored at home. NOTE: This sheet is a summary. It may not cover all possible information. If you have questions about this medicine, talk to your doctor, pharmacist, or health care provider.  2024 Elsevier/Gold Standard (2022-03-21 00:00:00)

## 2024-03-23 NOTE — Progress Notes (Unsigned)
 [facility]  [address] [place],  [place]  [address] [phone]  Clinic Day:  02/12/2024  Referring physician: Abbe Hoard., MD   HISTORY OF PRESENT ILLNESS:  The patient is a 78 y.o. male with limited stage small cell lung cancer.  He comes in today to be evaluated before heading into his 6th cycle of maintenance durvalumab  immunotherapy.  He initially completed definitive chemoradiation in October 2024 for his disease.  His chemotherapy consisted of 4 cycles of cisplatin /etoposide .  Since his last visit, the patient has been doing fairly well.  He denies having any worsening respiratory symptoms which concern him for disease progression.  His main complaint today is worsening peripheral neuropathy.  Of note, he takes Lyrica 50 mg 3 times daily.   PHYSICAL EXAM:  Blood pressure 120/72. Wt Readings from Last 3 Encounters:  03/24/24 181 lb 8 oz (82.3 kg)  02/25/24 178 lb 8 oz (81 kg)  02/12/24 177 lb 4.8 oz (80.4 kg)   There is no height or weight on file to calculate BMI. Performance status (ECOG): 1 - Symptomatic but completely ambulatory Physical Exam Constitutional:      Appearance: Normal appearance. He is not ill-appearing.  HENT:     Mouth/Throat:     Mouth: Mucous membranes are moist.     Pharynx: Oropharynx is clear. No oropharyngeal exudate or posterior oropharyngeal erythema.  Cardiovascular:     Rate and Rhythm: Normal rate and regular rhythm.     Heart sounds: No murmur heard.    No friction rub. No gallop.  Pulmonary:     Effort: Pulmonary effort is normal. No respiratory distress.     Breath sounds: Normal breath sounds. No wheezing, rhonchi or rales.  Abdominal:     General: Bowel sounds are normal. There is no distension.     Palpations: Abdomen is soft. There is no mass.     Tenderness: There is no abdominal tenderness.  Musculoskeletal:        General: No swelling.     Right lower leg: No edema.     Left lower  leg: No edema.  Lymphadenopathy:     Cervical: No cervical adenopathy.     Upper Body:     Right upper body: No supraclavicular or axillary adenopathy.     Left upper body: No supraclavicular or axillary adenopathy.     Lower Body: No right inguinal adenopathy. No left inguinal adenopathy.  Skin:    General: Skin is warm.     Coloration: Skin is not jaundiced.     Findings: No lesion or rash.  Neurological:     General: No focal deficit present.     Mental Status: He is alert and oriented to person, place, and time. Mental status is at baseline.  Psychiatric:        Mood and Affect: Mood normal.        Behavior: Behavior normal.        Thought Content: Thought content normal.    LABS:      Latest Ref Rng & Units 03/24/2024    8:59 AM 02/25/2024    2:03 PM 02/12/2024    8:48 AM  CBC  WBC 4.0 - 10.5 K/uL 5.9  8.0  8.8   Hemoglobin 13.0 - 17.0 g/dL 91.4  78.2  95.6   Hematocrit 39.0 - 52.0 % 37.0  36.2  37.7   Platelets 150 - 400 K/uL 227  207  223  Latest Ref Rng & Units 03/24/2024    8:59 AM 02/25/2024    2:03 PM 02/12/2024    8:48 AM  CMP  Glucose 70 - 99 mg/dL 161  096  045   BUN 8 - 23 mg/dL 13  20  21    Creatinine 0.61 - 1.24 mg/dL 4.09  8.11  9.14   Sodium 135 - 145 mmol/L 138  137  138   Potassium 3.5 - 5.1 mmol/L 3.9  4.2  4.1   Chloride 98 - 111 mmol/L 100  100  101   CO2 22 - 32 mmol/L 29  26  28    Calcium 8.9 - 10.3 mg/dL 78.2  9.4  95.6   Total Protein 6.5 - 8.1 g/dL 7.1  7.0  7.1   Total Bilirubin 0.0 - 1.2 mg/dL 0.4  0.2  0.3   Alkaline Phos 38 - 126 U/L 150  130  127   AST 15 - 41 U/L 22  12  13    ALT 0 - 44 U/L 7  <5  <5    ASSESSMENT & PLAN:  A 78 y.o. male with limited stage small cell lung cancer.  He will proceed with his 6th cycle of maintenance Durvalumab  immunotherapy today.  As mentioned previously, this patient already completed his chemoradiation in October 2024.  With respect to his peripheral neuropathy, the patient knows to increase his  Lyrica to 100 mg twice daily.  Overall, he looks fine today.  I will see him back in 4 weeks before he heads into his 7th cycle of maintenance durvalumab  immunotherapy.  The patient and his family understand all the plans discussed today and are in agreement with them.   Kaelon Weekes Felicia Horde, MD

## 2024-03-24 ENCOUNTER — Other Ambulatory Visit: Payer: Self-pay | Admitting: Oncology

## 2024-03-24 ENCOUNTER — Inpatient Hospital Stay: Attending: Oncology

## 2024-03-24 ENCOUNTER — Inpatient Hospital Stay: Admitting: Oncology

## 2024-03-24 ENCOUNTER — Inpatient Hospital Stay

## 2024-03-24 VITALS — BP 120/72 | HR 64 | Temp 97.9°F | Resp 18 | Ht 66.0 in | Wt 181.5 lb

## 2024-03-24 VITALS — BP 120/72

## 2024-03-24 DIAGNOSIS — Z923 Personal history of irradiation: Secondary | ICD-10-CM | POA: Diagnosis not present

## 2024-03-24 DIAGNOSIS — G629 Polyneuropathy, unspecified: Secondary | ICD-10-CM | POA: Insufficient documentation

## 2024-03-24 DIAGNOSIS — C3412 Malignant neoplasm of upper lobe, left bronchus or lung: Secondary | ICD-10-CM | POA: Insufficient documentation

## 2024-03-24 DIAGNOSIS — Z79899 Other long term (current) drug therapy: Secondary | ICD-10-CM | POA: Insufficient documentation

## 2024-03-24 DIAGNOSIS — Z9221 Personal history of antineoplastic chemotherapy: Secondary | ICD-10-CM | POA: Insufficient documentation

## 2024-03-24 DIAGNOSIS — Z5112 Encounter for antineoplastic immunotherapy: Secondary | ICD-10-CM | POA: Insufficient documentation

## 2024-03-24 LAB — CMP (CANCER CENTER ONLY)
ALT: 7 U/L (ref 0–44)
AST: 22 U/L (ref 15–41)
Albumin: 3.7 g/dL (ref 3.5–5.0)
Alkaline Phosphatase: 150 U/L — ABNORMAL HIGH (ref 38–126)
Anion gap: 10 (ref 5–15)
BUN: 13 mg/dL (ref 8–23)
CO2: 29 mmol/L (ref 22–32)
Calcium: 10.1 mg/dL (ref 8.9–10.3)
Chloride: 100 mmol/L (ref 98–111)
Creatinine: 0.9 mg/dL (ref 0.61–1.24)
GFR, Estimated: >60 mL/min (ref >60)
Glucose, Bld: 119 mg/dL — ABNORMAL HIGH (ref 70–99)
Potassium: 3.9 mmol/L (ref 3.5–5.1)
Sodium: 138 mmol/L (ref 135–145)
Total Bilirubin: 0.4 mg/dL (ref 0.0–1.2)
Total Protein: 7.1 g/dL (ref 6.5–8.1)

## 2024-03-24 LAB — CBC WITH DIFFERENTIAL (CANCER CENTER ONLY)
Abs Immature Granulocytes: 0.03 10*3/uL (ref 0.00–0.07)
Basophils Absolute: 0.1 10*3/uL (ref 0.0–0.1)
Basophils Relative: 1 %
Eosinophils Absolute: 0.2 10*3/uL (ref 0.0–0.5)
Eosinophils Relative: 3 %
HCT: 37 % — ABNORMAL LOW (ref 39.0–52.0)
Hemoglobin: 11.6 g/dL — ABNORMAL LOW (ref 13.0–17.0)
Immature Granulocytes: 1 %
Lymphocytes Relative: 19 %
Lymphs Abs: 1.1 10*3/uL (ref 0.7–4.0)
MCH: 27.2 pg (ref 26.0–34.0)
MCHC: 31.4 g/dL (ref 30.0–36.0)
MCV: 86.9 fL (ref 80.0–100.0)
Monocytes Absolute: 0.6 10*3/uL (ref 0.1–1.0)
Monocytes Relative: 11 %
Neutro Abs: 3.9 10*3/uL (ref 1.7–7.7)
Neutrophils Relative %: 65 %
Platelet Count: 227 10*3/uL (ref 150–400)
RBC: 4.26 MIL/uL (ref 4.22–5.81)
RDW: 16.6 % — ABNORMAL HIGH (ref 11.5–15.5)
WBC Count: 5.9 10*3/uL (ref 4.0–10.5)
nRBC: 0 % (ref 0.0–0.2)
nRBC: 0 /100{WBCs}

## 2024-03-24 LAB — TSH: TSH: 2.089 u[IU]/mL (ref 0.350–4.500)

## 2024-03-24 MED ORDER — HEPARIN SOD (PORK) LOCK FLUSH 100 UNIT/ML IV SOLN: 500.0000 [IU] | Freq: Once | INTRAVENOUS | Status: DC | PRN

## 2024-03-24 MED ORDER — PREGABALIN 50 MG PO CAPS: ORAL_CAPSULE | ORAL | 3 refills | Status: DC

## 2024-03-24 MED ORDER — SODIUM CHLORIDE 0.9 % IV SOLN
1500.0000 mg | Freq: Once | INTRAVENOUS | Status: AC
  Administered 2024-03-24: 1500 mg via INTRAVENOUS
  Filled 2024-03-24: qty 30

## 2024-03-24 MED ORDER — SODIUM CHLORIDE 0.9% FLUSH
10.0000 mL | INTRAVENOUS | Status: DC | PRN
Start: 2024-03-24 — End: 2024-03-24

## 2024-03-24 MED ORDER — SODIUM CHLORIDE 0.9 % IV SOLN: INTRAVENOUS | Status: DC

## 2024-03-24 NOTE — Patient Instructions (Signed)
 [facility] [place] - A DEPT OF [facility]  Discharge Instructions: Thank you for choosing [facility] to provide your oncology and hematology care.  If you have a lab appointment with the Cancer Center, please go directly to the Cancer Center and check in at the registration area.   Wear comfortable clothing and clothing appropriate for easy access to any Portacath or PICC line.   We strive to give you quality time with your provider. You may need to reschedule your appointment if you arrive late (15 or more minutes).  Arriving late affects you and other patients whose appointments are after yours.  Also, if you miss three or more appointments without notifying the office, you may be dismissed from the clinic at the provider's discretion.      For prescription refill requests, have your pharmacy contact our office and allow 72 hours for refills to be completed.    Today you received the following chemotherapy and/or immunotherapy agents Imfinzi       To help prevent nausea and vomiting after your treatment, we encourage you to take your nausea medication as directed.  BELOW ARE SYMPTOMS THAT SHOULD BE REPORTED IMMEDIATELY: *FEVER GREATER THAN 100.4 F (38 C) OR HIGHER *CHILLS OR SWEATING *NAUSEA AND VOMITING THAT IS NOT CONTROLLED WITH YOUR NAUSEA MEDICATION *UNUSUAL SHORTNESS OF BREATH *UNUSUAL BRUISING OR BLEEDING *URINARY PROBLEMS (pain or burning when urinating, or frequent urination) *BOWEL PROBLEMS (unusual diarrhea, constipation, pain near the anus) TENDERNESS IN MOUTH AND THROAT WITH OR WITHOUT PRESENCE OF ULCERS (sore throat, sores in mouth, or a toothache) UNUSUAL RASH, SWELLING OR PAIN  UNUSUAL VAGINAL DISCHARGE OR ITCHING   Items with * indicate a potential emergency and should be followed up as soon as possible or go to the Emergency Department if any problems should occur.  Please show the CHEMOTHERAPY ALERT CARD or IMMUNOTHERAPY ALERT  CARD at check-in to the Emergency Department and triage nurse.  Should you have questions after your visit or need to cancel or reschedule your appointment, please contact [facility] [place] - A DEPT OF [facility]  Dept: [phone]  and follow the prompts.  Office hours are 8:00 a.m. to 4:30 p.m. Monday - Friday. Please note that voicemails left after 4:00 p.m. may not be returned until the following business day.  We are closed weekends and major holidays. You have access to a nurse at all times for urgent questions. Please call the main number to the clinic Dept: [phone] and follow the prompts.  For any non-urgent questions, you may also contact your provider using MyChart. We now offer e-Visits for anyone 52 and older to request care online for non-urgent symptoms. For details visit [email or web address].   Also download the MyChart app! Go to the app store, search "MyChart", open the app, select [place], and log in with your MyChart username and password.

## 2024-03-25 ENCOUNTER — Other Ambulatory Visit: Payer: Self-pay

## 2024-03-25 ENCOUNTER — Other Ambulatory Visit: Payer: Self-pay | Admitting: Hematology and Oncology

## 2024-03-25 LAB — T4: T4, Total: 8.5 ug/dL (ref 4.5–12.0)

## 2024-03-25 MED ORDER — GABAPENTIN 300 MG PO CAPS: 300.0000 mg | ORAL_CAPSULE | Freq: Two times a day (BID) | ORAL | 0 refills | Status: DC

## 2024-04-21 ENCOUNTER — Encounter: Payer: Self-pay | Admitting: Oncology

## 2024-04-21 ENCOUNTER — Other Ambulatory Visit: Payer: Self-pay

## 2024-04-21 ENCOUNTER — Inpatient Hospital Stay: Attending: Oncology

## 2024-04-21 ENCOUNTER — Inpatient Hospital Stay

## 2024-04-21 ENCOUNTER — Inpatient Hospital Stay: Admitting: Hematology and Oncology

## 2024-04-21 ENCOUNTER — Encounter: Payer: Self-pay | Admitting: Hematology and Oncology

## 2024-04-21 VITALS — BP 123/67 | HR 72 | Temp 98.1°F | Resp 22 | Ht 66.0 in | Wt 182.7 lb

## 2024-04-21 DIAGNOSIS — I739 Peripheral vascular disease, unspecified: Secondary | ICD-10-CM | POA: Insufficient documentation

## 2024-04-21 DIAGNOSIS — D509 Iron deficiency anemia, unspecified: Secondary | ICD-10-CM | POA: Insufficient documentation

## 2024-04-21 DIAGNOSIS — Z87891 Personal history of nicotine dependence: Secondary | ICD-10-CM | POA: Diagnosis not present

## 2024-04-21 DIAGNOSIS — Z800 Family history of malignant neoplasm of digestive organs: Secondary | ICD-10-CM | POA: Diagnosis not present

## 2024-04-21 DIAGNOSIS — Z85828 Personal history of other malignant neoplasm of skin: Secondary | ICD-10-CM | POA: Insufficient documentation

## 2024-04-21 DIAGNOSIS — I2510 Atherosclerotic heart disease of native coronary artery without angina pectoris: Secondary | ICD-10-CM | POA: Diagnosis not present

## 2024-04-21 DIAGNOSIS — C3412 Malignant neoplasm of upper lobe, left bronchus or lung: Secondary | ICD-10-CM | POA: Diagnosis not present

## 2024-04-21 DIAGNOSIS — Z9221 Personal history of antineoplastic chemotherapy: Secondary | ICD-10-CM | POA: Diagnosis not present

## 2024-04-21 DIAGNOSIS — I10 Essential (primary) hypertension: Secondary | ICD-10-CM | POA: Insufficient documentation

## 2024-04-21 DIAGNOSIS — E538 Deficiency of other specified B group vitamins: Secondary | ICD-10-CM | POA: Diagnosis not present

## 2024-04-21 DIAGNOSIS — Z7951 Long term (current) use of inhaled steroids: Secondary | ICD-10-CM | POA: Diagnosis not present

## 2024-04-21 DIAGNOSIS — M129 Arthropathy, unspecified: Secondary | ICD-10-CM | POA: Diagnosis not present

## 2024-04-21 DIAGNOSIS — E785 Hyperlipidemia, unspecified: Secondary | ICD-10-CM | POA: Insufficient documentation

## 2024-04-21 DIAGNOSIS — Z923 Personal history of irradiation: Secondary | ICD-10-CM | POA: Diagnosis not present

## 2024-04-21 DIAGNOSIS — J4489 Other specified chronic obstructive pulmonary disease: Secondary | ICD-10-CM | POA: Insufficient documentation

## 2024-04-21 DIAGNOSIS — E7800 Pure hypercholesterolemia, unspecified: Secondary | ICD-10-CM | POA: Insufficient documentation

## 2024-04-21 DIAGNOSIS — Z5112 Encounter for antineoplastic immunotherapy: Secondary | ICD-10-CM | POA: Diagnosis present

## 2024-04-21 DIAGNOSIS — Z801 Family history of malignant neoplasm of trachea, bronchus and lung: Secondary | ICD-10-CM | POA: Insufficient documentation

## 2024-04-21 DIAGNOSIS — Z79899 Other long term (current) drug therapy: Secondary | ICD-10-CM | POA: Diagnosis not present

## 2024-04-21 DIAGNOSIS — J449 Chronic obstructive pulmonary disease, unspecified: Secondary | ICD-10-CM | POA: Diagnosis not present

## 2024-04-21 LAB — CBC WITH DIFFERENTIAL (CANCER CENTER ONLY)
Abs Immature Granulocytes: 0.02 10*3/uL (ref 0.00–0.07)
Basophils Absolute: 0.1 10*3/uL (ref 0.0–0.1)
Basophils Relative: 1 %
Eosinophils Absolute: 0.2 10*3/uL (ref 0.0–0.5)
Eosinophils Relative: 3 %
HCT: 37.5 % — ABNORMAL LOW (ref 39.0–52.0)
Hemoglobin: 12.1 g/dL — ABNORMAL LOW (ref 13.0–17.0)
Immature Granulocytes: 0 %
Lymphocytes Relative: 20 %
Lymphs Abs: 1.5 10*3/uL (ref 0.7–4.0)
MCH: 28.1 pg (ref 26.0–34.0)
MCHC: 32.3 g/dL (ref 30.0–36.0)
MCV: 87 fL (ref 80.0–100.0)
Monocytes Absolute: 0.8 10*3/uL (ref 0.1–1.0)
Monocytes Relative: 11 %
Neutro Abs: 4.9 10*3/uL (ref 1.7–7.7)
Neutrophils Relative %: 65 %
Platelet Count: 222 10*3/uL (ref 150–400)
RBC: 4.31 MIL/uL (ref 4.22–5.81)
RDW: 16.5 % — ABNORMAL HIGH (ref 11.5–15.5)
WBC Count: 7.5 10*3/uL (ref 4.0–10.5)
nRBC: 0 % (ref 0.0–0.2)

## 2024-04-21 LAB — CMP (CANCER CENTER ONLY)
ALT: 6 U/L (ref 0–44)
AST: 17 U/L (ref 15–41)
Albumin: 4.2 g/dL (ref 3.5–5.0)
Alkaline Phosphatase: 149 U/L — ABNORMAL HIGH (ref 38–126)
Anion gap: 12 (ref 5–15)
BUN: 15 mg/dL (ref 8–23)
CO2: 27 mmol/L (ref 22–32)
Calcium: 10 mg/dL (ref 8.9–10.3)
Chloride: 100 mmol/L (ref 98–111)
Creatinine: 0.97 mg/dL (ref 0.61–1.24)
GFR, Estimated: >60 mL/min (ref >60)
Glucose, Bld: 112 mg/dL — ABNORMAL HIGH (ref 70–99)
Potassium: 4 mmol/L (ref 3.5–5.1)
Sodium: 139 mmol/L (ref 135–145)
Total Bilirubin: 0.3 mg/dL (ref 0.0–1.2)
Total Protein: 7.2 g/dL (ref 6.5–8.1)

## 2024-04-21 MED ORDER — SODIUM CHLORIDE 0.9 % IV SOLN: INTRAVENOUS | Status: DC

## 2024-04-21 MED ORDER — SODIUM CHLORIDE 0.9 % IV SOLN
1500.0000 mg | Freq: Once | INTRAVENOUS | Status: AC
  Administered 2024-04-21: 1500 mg via INTRAVENOUS
  Filled 2024-04-21: qty 30

## 2024-04-21 MED ORDER — SODIUM CHLORIDE 0.9% FLUSH: 10.0000 mL | INTRAVENOUS | Status: DC | PRN

## 2024-04-21 NOTE — Progress Notes (Signed)
 [facility]  [address] [place],  [place]  [address] [phone]  Clinic Day:  04/21/2024  Referring physician: Abbe Hoard., MD  ASSESSMENT & PLAN:   Assessment & Plan:  A 78 y.o. male with limited stage small cell lung cancer.  He will proceed with his 6th cycle of maintenance Durvalumab  immunotherapy today.  As mentioned previously, this patient already completed his chemoradiation in October 2024.  With respect to his peripheral neuropathy, the patient had increased his dose to 100 mg twice daily. He states this is not working. I did advise that we can increase to 100 mg three times daily.  Overall, he looks fine today.  He will return in 4 weeks before he heads into his 8th cycle of maintenance durvalumab  immunotherapy.  The patient and his family understand all the plans discussed today and are in agreement with them.     The patient understands the plans discussed today and is in agreement with them.  He knows to contact our office if he develops concerns prior to his next appointment.   I provided 20 minutes of face-to-face time during this encounter and > 50% was spent counseling as documented under my assessment and plan.    Adelaide Adjutant, NP  [facility] - A DEPT OF [facility]  HOSPITAL [address] [place] [address] Dept: [phone] Dept Fax: [phone]   No orders of the defined types were placed in this encounter.     CHIEF COMPLAINT:  CC: Lung Cancer SCLC   Current Treatment:  Durvalumab   HISTORY OF PRESENT ILLNESS:   Oncology History  Small cell lung cancer, left upper lobe (HCC)  06/05/2023 Initial Diagnosis   Small cell lung cancer, left upper lobe (HCC)   06/18/2023 - 08/23/2023 Chemotherapy   Patient is on Treatment Plan : LUNG SMALL CELL Cisplatin  (80) D1 + Etoposide  (100) D1-3 q21d     10/30/2023 - 10/30/2023 Chemotherapy   Patient is on Treatment Plan : LUNG NSCLC  Durvalumab  (1500) q28d     11/05/2023 -  Chemotherapy   Patient is on Treatment Plan : LUNG SCLC Durvalumab  (1500) q28d         INTERVAL HISTORY:  [name] is here today for repeat clinical assessment. He denies fevers or chills. He denies pain. His appetite is good. His weight has been stable.  REVIEW OF SYSTEMS:  Review of Systems  Constitutional: Negative.   HENT:  Negative.    Eyes: Negative.   Respiratory:  Positive for shortness of breath.   Cardiovascular: Negative.   Gastrointestinal: Negative.   Endocrine: Negative.   Genitourinary: Negative.    Skin: Negative.   Neurological:  Positive for extremity weakness and numbness.  Hematological: Negative.   Psychiatric/Behavioral: Negative.       VITALS:   Blood pressure 123/67, pulse 72, temperature 98.1 F (36.7 C), temperature source Oral, resp. rate (!) 22, height 5\' 6"  (1.676 m), weight 182 lb 11.2 oz (82.9 kg), SpO2 92%.  Wt Readings from Last 3 Encounters:  04/21/24 182 lb 11.2 oz (82.9 kg)  03/24/24 181 lb 8 oz (82.3 kg)  02/25/24 178 lb 8 oz (81 kg)    Body mass index is 29.49 kg/m.  Performance status (ECOG): 1 - Symptomatic but completely ambulatory    PHYSICAL EXAM:  Physical Exam Vitals reviewed.  Constitutional:      Appearance: Normal appearance. He is normal weight.  HENT:  Head: Normocephalic and atraumatic.     Mouth/Throat:     Mouth: Mucous membranes are moist.  Cardiovascular:     Rate and Rhythm: Normal rate and regular rhythm.     Pulses: Normal pulses.     Heart sounds: Normal heart sounds.  Pulmonary:     Effort: Pulmonary effort is normal.     Breath sounds: Normal breath sounds.  Abdominal:     General: Abdomen is flat.  Musculoskeletal:        General: Normal range of motion.  Skin:    General: Skin is warm and dry.  Neurological:     General: No focal deficit present.     Mental Status: He is alert and oriented to person, place, and time. Mental status is at baseline.   Psychiatric:        Mood and Affect: Mood normal.        Behavior: Behavior normal.        Thought Content: Thought content normal.        Judgment: Judgment normal.     LABS:      Latest Ref Rng & Units 04/21/2024    8:02 AM 03/24/2024    8:59 AM 02/25/2024    2:03 PM  CBC  WBC 4.0 - 10.5 K/uL 7.5  5.9  8.0   Hemoglobin 13.0 - 17.0 g/dL 40.9  81.1  91.4   Hematocrit 39.0 - 52.0 % 37.5  37.0  36.2   Platelets 150 - 400 K/uL 222  227  207       Latest Ref Rng & Units 04/21/2024    8:02 AM 03/24/2024    8:59 AM 02/25/2024    2:03 PM  CMP  Glucose 70 - 99 mg/dL 782  956  213   BUN 8 - 23 mg/dL 15  13  20    Creatinine 0.61 - 1.24 mg/dL 0.86  5.78  4.69   Sodium 135 - 145 mmol/L 139  138  137   Potassium 3.5 - 5.1 mmol/L 4.0  3.9  4.2   Chloride 98 - 111 mmol/L 100  100  100   CO2 22 - 32 mmol/L 27  29  26    Calcium 8.9 - 10.3 mg/dL 62.9  52.8  9.4   Total Protein 6.5 - 8.1 g/dL 7.2  7.1  7.0   Total Bilirubin 0.0 - 1.2 mg/dL 0.3  0.4  0.2   Alkaline Phos 38 - 126 U/L 149  150  130   AST 15 - 41 U/L 17  22  12    ALT 0 - 44 U/L 6  7  <5      No results found for: "CEA1", "CEA" / No results found for: "CEA1", "CEA" No results found for: "PSA1" No results found for: "UXL244" No results found for: "CAN125"  No results found for: "TOTALPROTELP", "ALBUMINELP", "A1GS", "A2GS", "BETS", "BETA2SER", "GAMS", "MSPIKE", "SPEI" No results found for: "TIBC", "FERRITIN", "IRONPCTSAT" No results found for: "LDH"  STUDIES:  No results found.    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 BMI 29.19   COPD (chronic obstructive pulmonary disease) (HCC) 07/23/2019   Coronary artery disease involving native coronary artery of native heart without angina pectoris 04/22/2015   Formatting of this note might be different from the original. Calcification noted on CT of his chest in 2016 Formatting of this note might be different from the original. 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 Grace Laura in past.  Will see again.   Malaise and fatigue 04/24/2016   Mass of lower lobe of left lung 09/10/2019   Formatting of this note might be different from the original. Will need a follow up CT Pt aware   Mediastinal adenopathy 05/29/2023   Other nonspecific abnormal finding of lung field 04/11/2023   2.3 cm CT [facility] 03/2023.  Lymphadenopathy     Peripheral vascular disease (HCC) 04/24/2016   Formatting of this note might be different from the original. 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 His smoking use included cigarettes. He started smoking about 66 years ago. He has a 33.8 pack-year smoking history. He has never used smokeless tobacco. He reports that he does not drink alcohol and does not use drugs.The patient is accompanied by wife today.  Allergies:  Allergies  Allergen Reactions   Codeine Nausea And Vomiting, Other (See Comments) and Itching    Hallucinations  Hallucinations    Nausea, jumpy   Levofloxacin  Itching   Levofloxacin  In D5w Itching    Current Medications: Current Outpatient Medications  Medication Sig Dispense Refill   albuterol (ACCUNEB) 0.63 MG/3ML nebulizer solution Inhale 3 mLs into the lungs every 6 (six) hours as needed for wheezing.     albuterol (VENTOLIN HFA) 108 (90 BASE) MCG/ACT inhaler Inhale 2 puffs into the lungs every 4 (four) hours as needed for wheezing or shortness of breath.      atorvastatin (LIPITOR) 40 MG tablet Take 40 mg by mouth daily.     budesonide -formoterol  (SYMBICORT ) 160-4.5 MCG/ACT inhaler Inhale 2 puffs into the lungs at bedtime.  Calcium Carb-Cholecalciferol (OYSTER SHELL CALCIUM W/D) 500-5 MG-MCG TABS Take 1 tablet by mouth daily.     cholecalciferol (VITAMIN D) 1000 UNITS tablet Take 1,000 Units by mouth daily.     co-enzyme Q-10 30 MG capsule Take 30 mg by mouth daily.     Cyanocobalamin (B-12) 2500 MCG TABS Take 1 tablet by mouth every morning.     docusate sodium (COLACE) 100 MG capsule Take 100 mg by mouth at bedtime.     ferrous sulfate 325 (65 FE) MG EC tablet Take 325 mg by mouth daily with breakfast.     fluticasone (FLONASE) 50 MCG/ACT nasal  spray Place 2 sprays into both nostrils daily.     gabapentin  (NEURONTIN ) 300 MG capsule Take 1 capsule (300 mg total) by mouth 2 (two) times daily. 60 capsule 0   ipratropium-albuterol (DUONEB) 0.5-2.5 (3) MG/3ML SOLN Take 3 mLs by nebulization every 6 (six) hours.     loperamide (IMODIUM A-D) 2 MG tablet Take 2 mg by mouth as directed. Take 2 tabs after initial diarrhea episode very morning, then 1 tab after each diarrhea thereafter, up to 8 tabs per day, while on immunotherapy.     magic mouthwash SOLN Take 5 mLs by mouth. Called into [facility] 07-05-2023.     montelukast (SINGULAIR) 10 MG tablet Take 10 mg by mouth at bedtime.     mupirocin ointment (BACTROBAN) 2 % Apply 1 Application topically 2 (two) times daily.     nitroGLYCERIN  (NITROSTAT ) 0.4 MG SL tablet Place 0.4 mg under the tongue every 5 (five) minutes as needed.     ondansetron  (ZOFRAN ) 8 MG tablet Take 8 mg by mouth every 8 (eight) hours as needed.     pantoprazole (PROTONIX) 40 MG tablet Take 40 mg by mouth daily.     prochlorperazine  (COMPAZINE ) 10 MG tablet Take 10 mg by mouth every 6 (six) hours as needed.     zinc gluconate 3.75 mg/mL SOLN Take 1 tablet by mouth every morning.     No current facility-administered medications for this visit.   Facility-Administered Medications Ordered in Other Visits  Medication Dose Route Frequency Provider Last Rate Last Admin   0.9 %  sodium chloride  infusion   Intravenous Continuous Lewis, Dequincy A, MD   Stopped at 04/21/24 1143   sodium chloride  flush (NS) 0.9 % injection 10 mL  10 mL Intracatheter PRN Deloria Fetch, MD

## 2024-04-21 NOTE — Progress Notes (Deleted)
 [facility] [address] [place],  [place]  [address] [phone]  Clinic Day:  04/21/2024   Referring physician: Abbe Hoard., MD  Patient Care Team: Patient Care Team: Abbe Hoard., MD as PCP - General (Internal Medicine) Revankar, Micael Adas, MD as PCP - Cardiology (Cardiology) Abbe Hoard., MD (Internal Medicine) Chodri, Tanvir, MD as Referring Physician (Specialist)   REASON FOR CONSULTATION:    HISTORY OF PRESENT ILLNESS:  Manuel Sanchez is a 78 y.o. male with a history of *** who is referred in consultation by {REFERRING PHYSICIAN} for assessment and management. ***   REVIEW OF SYSTEMS:  Review of Systems - Oncology   VITALS:   There were no vitals taken for this visit.  Wt Readings from Last 3 Encounters:  03/24/24 181 lb 8 oz (82.3 kg)  02/25/24 178 lb 8 oz (81 kg)  02/12/24 177 lb 4.8 oz (80.4 kg)    There is no height or weight on file to calculate BMI.  Performance status (ECOG): {CHL ONC [id number]  PHYSICAL EXAM:  Physical Exam   LABS:      Latest Ref Rng & Units 04/21/2024    8:02 AM 03/24/2024    8:59 AM 02/25/2024    2:03 PM  CBC  WBC 4.0 - 10.5 K/uL 7.5  5.9  8.0   Hemoglobin 13.0 - 17.0 g/dL 91.4  78.2  95.6   Hematocrit 39.0 - 52.0 % 37.5  37.0  36.2   Platelets 150 - 400 K/uL 222  227  207       Latest Ref Rng & Units 03/24/2024    8:59 AM 02/25/2024    2:03 PM 02/12/2024    8:48 AM  CMP  Glucose 70 - 99 mg/dL 213  086  578   BUN 8 - 23 mg/dL 13  20  21    Creatinine 0.61 - 1.24 mg/dL 4.69  6.29  5.28   Sodium 135 - 145 mmol/L 138  137  138   Potassium 3.5 - 5.1 mmol/L 3.9  4.2  4.1   Chloride 98 - 111 mmol/L 100  100  101   CO2 22 - 32 mmol/L 29  26  28    Calcium 8.9 - 10.3 mg/dL 41.3  9.4  24.4   Total Protein 6.5 - 8.1 g/dL 7.1  7.0  7.1   Total Bilirubin 0.0 - 1.2 mg/dL 0.4  0.2  0.3   Alkaline Phos 38 - 126 U/L 150  130  127   AST 15 - 41 U/L 22  12  13    ALT 0 - 44 U/L 7  <5  <5      No  results found for: "CEA1", "CEA" / No results found for: "CEA1", "CEA" No results found for: "PSA1" No results found for: "WNU272" No results found for: "CAN125"  No results found for: "TOTALPROTELP", "ALBUMINELP", "A1GS", "A2GS", "BETS", "BETA2SER", "GAMS", "MSPIKE", "SPEI" No results found for: "TIBC", "FERRITIN", "IRONPCTSAT" No results found for: "LDH"  STUDIES:  No results found.    HISTORY:   Past Medical History:  Diagnosis Date  . Abnormal nuclear stress test 10/20/2019  . Arthritis 07/23/2019   Rt acromioclavicular joint  . 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 Arthritis of right acromioclavicular joint 05/18/2017  . CAD (coronary artery disease) 07/23/2019  . Chest discomfort 07/23/2019  . Chronic obstructive pulmonary disease (HCC) 08/23/2016  . Class 1 obesity due to excess calories with serious comorbidity and  body mass index (BMI) of 30.0 to 30.9 in adult 04/24/2016   Formatting of this note might be different from the original. BMI 29.19  . COPD (chronic obstructive pulmonary disease) (HCC) 07/23/2019  . Coronary artery disease involving native coronary artery of native heart without angina pectoris 04/22/2015   Formatting of this note might be different from the original. Calcification noted on CT of his chest in 2016 Formatting of this note might be different from the original. Overview:  Calcification noted on CT of his chest in 2016  . Cyst of left kidney 07/23/2019  . Dyslipidemia 04/22/2015  . Elevated blood-pressure reading without diagnosis of hypertension 01/22/2020  . Essential hypertension 08/03/2020  . Ex-smoker 07/23/2019  . GERD without esophagitis 08/19/2020  . High risk medication use 04/24/2016  . Hypercholesterolemia 07/23/2019  . 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  Iron deficiency anemia 09/10/2019   Formatting of this note might be different from the original. Grace Laura in past.  Will see again.  Chriss Coup and fatigue 04/24/2016  . Mass of lower lobe of left lung 09/10/2019   Formatting of this note might be different from the original. Will need a follow up CT Pt aware  . Mediastinal adenopathy 05/29/2023  . Other nonspecific abnormal finding of lung field 04/11/2023   2.3 cm CT [facility] 03/2023.  Lymphadenopathy    . Peripheral vascular disease (HCC) 04/24/2016   Formatting of this note might be different from the original. On CT 03/2015  . Peripheral vascular disease, unspecified (HCC) 07/23/2019  . Pre-diabetes 04/24/2016  . 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 Skin cancer of nose 07/23/2019  . Vitamin B12 deficiency 07/23/2019    Past Surgical History:  Procedure Laterality Date  . COLONOSCOPY W/ POLYPECTOMY    . TYMPAMOPLASTY Left     Family History  Problem Relation Age of Onset  . Heart disease Mother   . Stroke Mother   . Hypertension Mother   . Heart attack Mother   . Hearing loss Mother   . Colon cancer Father   . Stomach cancer Sister   . Lung cancer Sister     Social History:  reports that he quit smoking about 21 years ago. His smoking use included cigarettes. He started smoking about 66 years ago. He has a 33.8 pack-year smoking history. He has never used smokeless tobacco. He reports that he does not drink alcohol and does not use drugs.The patient is {Blank single:19197::"alone","accompanied by"} *** today.  Allergies:  Allergies  Allergen Reactions  . Codeine Nausea And Vomiting, Other (See Comments) and Itching    Hallucinations  Hallucinations    Nausea, jumpy  . Levofloxacin  Itching  . Levofloxacin  In D5w Itching    Current Medications: Current Outpatient Medications  Medication Sig Dispense Refill  . albuterol (ACCUNEB) 0.63 MG/3ML nebulizer solution Inhale 3  mLs into the lungs every 6 (six) hours as needed for wheezing.    Aaron Aas albuterol (VENTOLIN HFA) 108 (90 BASE) MCG/ACT inhaler Inhale 2 puffs into the lungs every 4 (four) hours as needed for wheezing or shortness of breath.     Aaron Aas atorvastatin (LIPITOR) 40 MG tablet Take 40 mg by mouth daily.    . budesonide -formoterol  (SYMBICORT ) 160-4.5 MCG/ACT inhaler Inhale 2 puffs  into the lungs at bedtime.    . Calcium Carb-Cholecalciferol (OYSTER SHELL CALCIUM W/D) 500-5 MG-MCG TABS Take 1 tablet by mouth daily.    . cholecalciferol (VITAMIN D) 1000 UNITS tablet Take 1,000 Units by mouth daily.    Aaron Aas co-enzyme Q-10 30 MG capsule Take 30 mg by mouth daily.    . Cyanocobalamin (B-12) 2500 MCG TABS Take 1 tablet by mouth every morning.    . docusate sodium (COLACE) 100 MG capsule Take 100 mg by mouth at bedtime.    . ferrous sulfate 325 (65 FE) MG EC tablet Take 325 mg by mouth daily with breakfast.    . fluticasone (FLONASE) 50 MCG/ACT nasal spray Place 2 sprays into both nostrils daily.    . gabapentin  (NEURONTIN ) 300 MG capsule Take 1 capsule (300 mg total) by mouth 2 (two) times daily. 60 capsule 0  . ipratropium-albuterol (DUONEB) 0.5-2.5 (3) MG/3ML SOLN Take 3 mLs by nebulization every 6 (six) hours.    Aaron Aas loperamide (IMODIUM A-D) 2 MG tablet Take 2 mg by mouth as directed. Take 2 tabs after initial diarrhea episode very morning, then 1 tab after each diarrhea thereafter, up to 8 tabs per day, while on immunotherapy.    . magic mouthwash SOLN Take 5 mLs by mouth. Called into [facility] 07-05-2023.    . montelukast (SINGULAIR) 10 MG tablet Take 10 mg by mouth at bedtime.    . mupirocin ointment (BACTROBAN) 2 % Apply 1 Application topically 2 (two) times daily.    . nitroGLYCERIN  (NITROSTAT ) 0.4 MG SL tablet Place 0.4 mg under the tongue every 5 (five) minutes as needed.    . ondansetron  (ZOFRAN ) 8 MG tablet Take 8 mg by mouth every 8 (eight) hours as needed.    . pantoprazole (PROTONIX) 40 MG tablet Take 40  mg by mouth daily.    . prochlorperazine  (COMPAZINE ) 10 MG tablet Take 10 mg by mouth every 6 (six) hours as needed.    . zinc gluconate 3.75 mg/mL SOLN Take 1 tablet by mouth every morning.     No current facility-administered medications for this visit.     ASSESSMENT & PLAN:   Assessment:  AJAY STRUBEL is a 78 y.o. male ***  Plan: 1.  ***  I discussed the assessment and treatment plan with the patient.  The patient was provided an opportunity to ask questions and all were answered.  The patient agreed with the plan and demonstrated an understanding of the instructions.    Thank you for the referral    *** minutes was spent in patient care.  This included time spent preparing to see the patient (e.g., review of tests), obtaining and/or reviewing separately obtained history, counseling and educating the patient/family/caregiver, ordering medications, tests, or procedures; documenting clinical information in the electronic or other health record, independently interpreting results and communicating results to the patient/family/caregiver as well as coordination of care.      Adelaide Adjutant, NP   Physician Assistant [facility] [phone]

## 2024-04-21 NOTE — Patient Instructions (Signed)
 Durvalumab Injection What is this medication? DURVALUMAB (dur VAL ue mab) treats some types of cancer. It works by helping your immune system slow or stop the spread of cancer cells. It is a monoclonal antibody. This medicine may be used for other purposes; ask your health care provider or pharmacist if you have questions. COMMON BRAND NAME(S): IMFINZI What should I tell my care team before I take this medication? They need to know if you have any of these conditions: Allogeneic stem cell transplant (uses someone else's stem cells) Autoimmune diseases, such as Crohn disease, ulcerative colitis, lupus History of chest radiation Nervous system problems, such as Guillain-Barre syndrome, myasthenia gravis Organ transplant An unusual or allergic reaction to durvalumab, other medications, foods, dyes, or preservatives Pregnant or trying to get pregnant Breast-feeding How should I use this medication? This medication is infused into a vein. It is given by your care team in a hospital or clinic setting. A special MedGuide will be given to you before each treatment. Be sure to read this information carefully each time. Talk to your care team about the use of this medication in children. Special care may be needed. Overdosage: If you think you have taken too much of this medicine contact a poison control center or emergency room at once. NOTE: This medicine is only for you. Do not share this medicine with others. What if I miss a dose? Keep appointments for follow-up doses. It is important not to miss your dose. Call your care team if you are unable to keep an appointment. What may interact with this medication? Interactions have not been studied. This list may not describe all possible interactions. Give your health care provider a list of all the medicines, herbs, non-prescription drugs, or dietary supplements you use. Also tell them if you smoke, drink alcohol, or use illegal drugs. Some items may  interact with your medicine. What should I watch for while using this medication? Your condition will be monitored carefully while you are receiving this medication. You may need blood work while taking this medication. This medication may cause serious skin reactions. They can happen weeks to months after starting the medication. Contact your care team right away if you notice fevers or flu-like symptoms with a rash. The rash may be red or purple and then turn into blisters or peeling of the skin. You may also notice a red rash with swelling of the face, lips, or lymph nodes in your neck or under your arms. Tell your care team right away if you have any change in your eyesight. Talk to your care team if you may be pregnant. Serious birth defects can occur if you take this medication during pregnancy and for 3 months after the last dose. You will need a negative pregnancy test before starting this medication. Contraception is recommended while taking this medication and for 3 months after the last dose. Your care team can help you find the option that works for you. Do not breastfeed while taking this medication and for 3 months after the last dose. What side effects may I notice from receiving this medication? Side effects that you should report to your care team as soon as possible: Allergic reactions--skin rash, itching, hives, swelling of the face, lips, tongue, or throat Dry cough, shortness of breath or trouble breathing Eye pain, redness, irritation, or discharge with blurry or decreased vision Heart muscle inflammation--unusual weakness or fatigue, shortness of breath, chest pain, fast or irregular heartbeat, dizziness, swelling of the  ankles, feet, or hands Hormone gland problems--headache, sensitivity to light, unusual weakness or fatigue, dizziness, fast or irregular heartbeat, increased sensitivity to cold or heat, excessive sweating, constipation, hair loss, increased thirst or amount of  urine, tremors or shaking, irritability Infusion reactions--chest pain, shortness of breath or trouble breathing, feeling faint or lightheaded Kidney injury (glomerulonephritis)--decrease in the amount of urine, red or dark brown urine, foamy or bubbly urine, swelling of the ankles, hands, or feet Liver injury--right upper belly pain, loss of appetite, nausea, light-colored stool, dark yellow or brown urine, yellowing skin or eyes, unusual weakness or fatigue Pain, tingling, or numbness in the hands or feet, muscle weakness, change in vision, confusion or trouble speaking, loss of balance or coordination, trouble walking, seizures Rash, fever, and swollen lymph nodes Redness, blistering, peeling, or loosening of the skin, including inside the mouth Sudden or severe stomach pain, bloody diarrhea, fever, nausea, vomiting Side effects that usually do not require medical attention (report these to your care team if they continue or are bothersome): Bone, joint, or muscle pain Diarrhea Fatigue Loss of appetite Nausea Skin rash This list may not describe all possible side effects. Call your doctor for medical advice about side effects. You may report side effects to FDA at 1-800-FDA-1088. Where should I keep my medication? This medication is given in a hospital or clinic. It will not be stored at home. NOTE: This sheet is a summary. It may not cover all possible information. If you have questions about this medicine, talk to your doctor, pharmacist, or health care provider.  2024 Elsevier/Gold Standard (2022-03-21 00:00:00)

## 2024-04-28 ENCOUNTER — Other Ambulatory Visit: Payer: Self-pay | Admitting: Hematology and Oncology

## 2024-04-28 DIAGNOSIS — G620 Drug-induced polyneuropathy: Secondary | ICD-10-CM

## 2024-05-18 NOTE — Progress Notes (Deleted)
 [facility]  [address] [place],  [place]  [address] [phone]  Clinic Day:  02/12/2024  Referring physician: Thurmond Cathlyn LABOR., MD   HISTORY OF PRESENT ILLNESS:  The patient is a 78 y.o. male with limited stage small cell lung cancer.  He comes in today to be evaluated before heading into his 7th cycle of maintenance durvalumab  immunotherapy.  He initially completed definitive chemoradiation in October 2024 for his disease.  His chemotherapy consisted of 4 cycles of cisplatin /etoposide .  Since his last visit, the patient has been doing fairly well.  He denies having any worsening respiratory symptoms which concern him for disease progression.  His main complaint today is worsening peripheral neuropathy.  Of note, he takes Lyrica  50 mg 3 times daily.   PHYSICAL EXAM:  There were no vitals taken for this visit. Wt Readings from Last 3 Encounters:  04/21/24 182 lb 11.2 oz (82.9 kg)  03/24/24 181 lb 8 oz (82.3 kg)  02/25/24 178 lb 8 oz (81 kg)   There is no height or weight on file to calculate BMI. Performance status (ECOG): 1 - Symptomatic but completely ambulatory Physical Exam Constitutional:      Appearance: Normal appearance. He is not ill-appearing.  HENT:     Mouth/Throat:     Mouth: Mucous membranes are moist.     Pharynx: Oropharynx is clear. No oropharyngeal exudate or posterior oropharyngeal erythema.   Cardiovascular:     Rate and Rhythm: Normal rate and regular rhythm.     Heart sounds: No murmur heard.    No friction rub. No gallop.  Pulmonary:     Effort: Pulmonary effort is normal. No respiratory distress.     Breath sounds: Normal breath sounds. No wheezing, rhonchi or rales.  Abdominal:     General: Bowel sounds are normal. There is no distension.     Palpations: Abdomen is soft. There is no mass.     Tenderness: There is no abdominal tenderness.   Musculoskeletal:        General: No swelling.     Right lower leg: No  edema.     Left lower leg: No edema.  Lymphadenopathy:     Cervical: No cervical adenopathy.     Upper Body:     Right upper body: No supraclavicular or axillary adenopathy.     Left upper body: No supraclavicular or axillary adenopathy.     Lower Body: No right inguinal adenopathy. No left inguinal adenopathy.   Skin:    General: Skin is warm.     Coloration: Skin is not jaundiced.     Findings: No lesion or rash.   Neurological:     General: No focal deficit present.     Mental Status: He is alert and oriented to person, place, and time. Mental status is at baseline.   Psychiatric:        Mood and Affect: Mood normal.        Behavior: Behavior normal.        Thought Content: Thought content normal.    LABS:      Latest Ref Rng & Units 04/21/2024    8:02 AM 03/24/2024    8:59 AM 02/25/2024    2:03 PM  CBC  WBC 4.0 - 10.5 K/uL 7.5  5.9  8.0   Hemoglobin 13.0 - 17.0 g/dL 87.8  88.3  88.1   Hematocrit 39.0 - 52.0 % 37.5  37.0  36.2   Platelets 150 -  400 K/uL 222  227  207       Latest Ref Rng & Units 04/21/2024    8:02 AM 03/24/2024    8:59 AM 02/25/2024    2:03 PM  CMP  Glucose 70 - 99 mg/dL 887  880  754   BUN 8 - 23 mg/dL 15  13  20    Creatinine 0.61 - 1.24 mg/dL 9.02  9.09  9.03   Sodium 135 - 145 mmol/L 139  138  137   Potassium 3.5 - 5.1 mmol/L 4.0  3.9  4.2   Chloride 98 - 111 mmol/L 100  100  100   CO2 22 - 32 mmol/L 27  29  26    Calcium 8.9 - 10.3 mg/dL 89.9  89.8  9.4   Total Protein 6.5 - 8.1 g/dL 7.2  7.1  7.0   Total Bilirubin 0.0 - 1.2 mg/dL 0.3  0.4  0.2   Alkaline Phos 38 - 126 U/L 149  150  130   AST 15 - 41 U/L 17  22  12    ALT 0 - 44 U/L 6  7  <5    ASSESSMENT & PLAN:  A 78 y.o. male with limited stage small cell lung cancer.  He will proceed with his 6th cycle of maintenance Durvalumab  immunotherapy today.  As mentioned previously, this patient already completed his chemoradiation in October 2024.  With respect to his peripheral neuropathy, the patient  knows to increase his Lyrica  to 100 mg twice daily.  Overall, he looks fine today.  I will see him back in 4 weeks before he heads into his 7th cycle of maintenance durvalumab  immunotherapy.  The patient and his family understand all the plans discussed today and are in agreement with them.   Aarica Wax DELENA Kerns, MD

## 2024-05-19 ENCOUNTER — Inpatient Hospital Stay

## 2024-05-19 ENCOUNTER — Telehealth: Payer: Self-pay | Admitting: Oncology

## 2024-05-19 ENCOUNTER — Inpatient Hospital Stay: Admitting: Oncology

## 2024-05-19 ENCOUNTER — Encounter: Payer: Self-pay | Admitting: Oncology

## 2024-05-19 NOTE — Telephone Encounter (Signed)
 Patient has been scheduled for follow-up visit per 05/19/24 LOS.  Pt aware of scheduled appt details.

## 2024-05-20 ENCOUNTER — Other Ambulatory Visit: Payer: Self-pay

## 2024-05-26 ENCOUNTER — Inpatient Hospital Stay: Attending: Oncology

## 2024-05-26 ENCOUNTER — Inpatient Hospital Stay (HOSPITAL_BASED_OUTPATIENT_CLINIC_OR_DEPARTMENT_OTHER): Admitting: Hematology and Oncology

## 2024-05-26 ENCOUNTER — Inpatient Hospital Stay

## 2024-05-26 ENCOUNTER — Other Ambulatory Visit: Payer: Self-pay

## 2024-05-26 ENCOUNTER — Telehealth: Payer: Self-pay | Admitting: Hematology and Oncology

## 2024-05-26 VITALS — BP 109/68 | HR 91 | Temp 98.4°F | Resp 22 | Ht 66.0 in | Wt 179.9 lb

## 2024-05-26 DIAGNOSIS — Z79899 Other long term (current) drug therapy: Secondary | ICD-10-CM | POA: Insufficient documentation

## 2024-05-26 DIAGNOSIS — C3412 Malignant neoplasm of upper lobe, left bronchus or lung: Secondary | ICD-10-CM

## 2024-05-26 DIAGNOSIS — G629 Polyneuropathy, unspecified: Secondary | ICD-10-CM | POA: Insufficient documentation

## 2024-05-26 DIAGNOSIS — Z9221 Personal history of antineoplastic chemotherapy: Secondary | ICD-10-CM | POA: Insufficient documentation

## 2024-05-26 DIAGNOSIS — Z923 Personal history of irradiation: Secondary | ICD-10-CM | POA: Insufficient documentation

## 2024-05-26 DIAGNOSIS — Z5112 Encounter for antineoplastic immunotherapy: Secondary | ICD-10-CM | POA: Insufficient documentation

## 2024-05-26 LAB — CMP (CANCER CENTER ONLY)
ALT: <5 U/L (ref 0–44)
AST: 15 U/L (ref 15–41)
Albumin: 3.8 g/dL (ref 3.5–5.0)
Alkaline Phosphatase: 153 U/L — ABNORMAL HIGH (ref 38–126)
Anion gap: 11 (ref 5–15)
BUN: 13 mg/dL (ref 8–23)
CO2: 24 mmol/L (ref 22–32)
Calcium: 9.6 mg/dL (ref 8.9–10.3)
Chloride: 104 mmol/L (ref 98–111)
Creatinine: 1.06 mg/dL (ref 0.61–1.24)
GFR, Estimated: >60 mL/min (ref >60)
Glucose, Bld: 95 mg/dL (ref 70–99)
Potassium: 4.2 mmol/L (ref 3.5–5.1)
Sodium: 139 mmol/L (ref 135–145)
Total Bilirubin: 0.5 mg/dL (ref 0.0–1.2)
Total Protein: 7 g/dL (ref 6.5–8.1)

## 2024-05-26 LAB — CBC WITH DIFFERENTIAL (CANCER CENTER ONLY)
Abs Immature Granulocytes: 0.03 K/uL (ref 0.00–0.07)
Basophils Absolute: 0.1 K/uL (ref 0.0–0.1)
Basophils Relative: 1 %
Eosinophils Absolute: 0.2 K/uL (ref 0.0–0.5)
Eosinophils Relative: 2 %
HCT: 37.2 % — ABNORMAL LOW (ref 39.0–52.0)
Hemoglobin: 12 g/dL — ABNORMAL LOW (ref 13.0–17.0)
Immature Granulocytes: 0 %
Lymphocytes Relative: 17 %
Lymphs Abs: 1.3 K/uL (ref 0.7–4.0)
MCH: 28.5 pg (ref 26.0–34.0)
MCHC: 32.3 g/dL (ref 30.0–36.0)
MCV: 88.4 fL (ref 80.0–100.0)
Monocytes Absolute: 0.7 K/uL (ref 0.1–1.0)
Monocytes Relative: 9 %
Neutro Abs: 5.5 K/uL (ref 1.7–7.7)
Neutrophils Relative %: 71 %
Platelet Count: 199 K/uL (ref 150–400)
RBC: 4.21 MIL/uL — ABNORMAL LOW (ref 4.22–5.81)
RDW: 15.5 % (ref 11.5–15.5)
WBC Count: 7.7 K/uL (ref 4.0–10.5)
nRBC: 0 % (ref 0.0–0.2)

## 2024-05-26 MED ORDER — SODIUM CHLORIDE 0.9 % IV SOLN: INTRAVENOUS | Status: DC

## 2024-05-26 MED ORDER — SODIUM CHLORIDE 0.9 % IV SOLN
1500.0000 mg | Freq: Once | INTRAVENOUS | Status: AC
  Administered 2024-05-26: 1500 mg via INTRAVENOUS
  Filled 2024-05-26: qty 30

## 2024-05-26 NOTE — Progress Notes (Signed)
 [facility]  [address] [place],  [place]  [address] [phone]  Clinic Day:  02/12/2024  Referring physician: Thurmond Cathlyn LABOR., MD   HISTORY OF PRESENT ILLNESS:  The patient is a 78 y.o. male with limited stage small cell lung cancer.  He comes in today to be evaluated before heading into his 9th cycle of maintenance durvalumab  immunotherapy.  He initially completed definitive chemoradiation in October 2024 for his disease.  His chemotherapy consisted of 4 cycles of cisplatin /etoposide .  Since his last visit, the patient has been doing fairly well.  He denies having any worsening respiratory symptoms which concern him for disease progression.  His main complaint today is worsening peripheral neuropathy. He has been taking gabapentin  100 mg twice daily.   PHYSICAL EXAM:  There were no vitals taken for this visit. Wt Readings from Last 3 Encounters:  04/21/24 182 lb 11.2 oz (82.9 kg)  03/24/24 181 lb 8 oz (82.3 kg)  02/25/24 178 lb 8 oz (81 kg)   There is no height or weight on file to calculate BMI. Performance status (ECOG): 1 - Symptomatic but completely ambulatory Physical Exam Constitutional:      Appearance: Normal appearance. He is not ill-appearing.  HENT:     Mouth/Throat:     Mouth: Mucous membranes are moist.     Pharynx: Oropharynx is clear. No oropharyngeal exudate or posterior oropharyngeal erythema.  Cardiovascular:     Rate and Rhythm: Normal rate and regular rhythm.     Heart sounds: No murmur heard.    No friction rub. No gallop.  Pulmonary:     Effort: Pulmonary effort is normal. No respiratory distress.     Breath sounds: Normal breath sounds. No wheezing, rhonchi or rales.  Abdominal:     General: Bowel sounds are normal. There is no distension.     Palpations: Abdomen is soft. There is no mass.     Tenderness: There is no abdominal tenderness.  Musculoskeletal:        General: No swelling.     Right lower leg: No  edema.     Left lower leg: No edema.  Lymphadenopathy:     Cervical: No cervical adenopathy.     Upper Body:     Right upper body: No supraclavicular or axillary adenopathy.     Left upper body: No supraclavicular or axillary adenopathy.     Lower Body: No right inguinal adenopathy. No left inguinal adenopathy.  Skin:    General: Skin is warm.     Coloration: Skin is not jaundiced.     Findings: No lesion or rash.  Neurological:     General: No focal deficit present.     Mental Status: He is alert and oriented to person, place, and time. Mental status is at baseline.  Psychiatric:        Mood and Affect: Mood normal.        Behavior: Behavior normal.        Thought Content: Thought content normal.    LABS:      Latest Ref Rng & Units 05/26/2024    9:34 AM 04/21/2024    8:02 AM 03/24/2024    8:59 AM  CBC  WBC 4.0 - 10.5 K/uL 7.7  7.5  5.9   Hemoglobin 13.0 - 17.0 g/dL 87.9  87.8  88.3   Hematocrit 39.0 - 52.0 % 37.2  37.5  37.0   Platelets 150 - 400 K/uL 199  222  227       Latest Ref Rng & Units 04/21/2024    8:02 AM 03/24/2024    8:59 AM 02/25/2024    2:03 PM  CMP  Glucose 70 - 99 mg/dL 887  880  754   BUN 8 - 23 mg/dL 15  13  20    Creatinine 0.61 - 1.24 mg/dL 9.02  9.09  9.03   Sodium 135 - 145 mmol/L 139  138  137   Potassium 3.5 - 5.1 mmol/L 4.0  3.9  4.2   Chloride 98 - 111 mmol/L 100  100  100   CO2 22 - 32 mmol/L 27  29  26    Calcium 8.9 - 10.3 mg/dL 89.9  89.8  9.4   Total Protein 6.5 - 8.1 g/dL 7.2  7.1  7.0   Total Bilirubin 0.0 - 1.2 mg/dL 0.3  0.4  0.2   Alkaline Phos 38 - 126 U/L 149  150  130   AST 15 - 41 U/L 17  22  12    ALT 0 - 44 U/L 6  7  <5    ASSESSMENT & PLAN:  A 78 y.o. male with limited stage small cell lung cancer.  He will proceed with his 9th cycle of maintenance Durvalumab  immunotherapy today.  As mentioned previously, this patient already completed his chemoradiation in October 2024.  With respect to his peripheral neuropathy, the patient knows  to increase his gabapentin  to 100 mg three times daily.  Overall, he looks fine today.  I will see him back in 4 weeks before he heads into his 9th cycle of maintenance durvalumab  immunotherapy.  The patient and his family understand all the plans discussed today and are in agreement with them.   Eleanor DELENA Bach, NP

## 2024-05-26 NOTE — Telephone Encounter (Signed)
 Patient has been scheduled for follow-up visit per 05/26/24 LOS.  Pt given an appt calendar with date and time.

## 2024-05-26 NOTE — Patient Instructions (Signed)
 Durvalumab Injection What is this medication? DURVALUMAB (dur VAL ue mab) treats some types of cancer. It works by helping your immune system slow or stop the spread of cancer cells. It is a monoclonal antibody. This medicine may be used for other purposes; ask your health care provider or pharmacist if you have questions. COMMON BRAND NAME(S): IMFINZI What should I tell my care team before I take this medication? They need to know if you have any of these conditions: Allogeneic stem cell transplant (uses someone else's stem cells) Autoimmune diseases, such as Crohn disease, ulcerative colitis, lupus History of chest radiation Nervous system problems, such as Guillain-Barre syndrome, myasthenia gravis Organ transplant An unusual or allergic reaction to durvalumab, other medications, foods, dyes, or preservatives Pregnant or trying to get pregnant Breast-feeding How should I use this medication? This medication is infused into a vein. It is given by your care team in a hospital or clinic setting. A special MedGuide will be given to you before each treatment. Be sure to read this information carefully each time. Talk to your care team about the use of this medication in children. Special care may be needed. Overdosage: If you think you have taken too much of this medicine contact a poison control center or emergency room at once. NOTE: This medicine is only for you. Do not share this medicine with others. What if I miss a dose? Keep appointments for follow-up doses. It is important not to miss your dose. Call your care team if you are unable to keep an appointment. What may interact with this medication? Interactions have not been studied. This list may not describe all possible interactions. Give your health care provider a list of all the medicines, herbs, non-prescription drugs, or dietary supplements you use. Also tell them if you smoke, drink alcohol, or use illegal drugs. Some items may  interact with your medicine. What should I watch for while using this medication? Your condition will be monitored carefully while you are receiving this medication. You may need blood work while taking this medication. This medication may cause serious skin reactions. They can happen weeks to months after starting the medication. Contact your care team right away if you notice fevers or flu-like symptoms with a rash. The rash may be red or purple and then turn into blisters or peeling of the skin. You may also notice a red rash with swelling of the face, lips, or lymph nodes in your neck or under your arms. Tell your care team right away if you have any change in your eyesight. Talk to your care team if you may be pregnant. Serious birth defects can occur if you take this medication during pregnancy and for 3 months after the last dose. You will need a negative pregnancy test before starting this medication. Contraception is recommended while taking this medication and for 3 months after the last dose. Your care team can help you find the option that works for you. Do not breastfeed while taking this medication and for 3 months after the last dose. What side effects may I notice from receiving this medication? Side effects that you should report to your care team as soon as possible: Allergic reactions--skin rash, itching, hives, swelling of the face, lips, tongue, or throat Dry cough, shortness of breath or trouble breathing Eye pain, redness, irritation, or discharge with blurry or decreased vision Heart muscle inflammation--unusual weakness or fatigue, shortness of breath, chest pain, fast or irregular heartbeat, dizziness, swelling of the  ankles, feet, or hands Hormone gland problems--headache, sensitivity to light, unusual weakness or fatigue, dizziness, fast or irregular heartbeat, increased sensitivity to cold or heat, excessive sweating, constipation, hair loss, increased thirst or amount of  urine, tremors or shaking, irritability Infusion reactions--chest pain, shortness of breath or trouble breathing, feeling faint or lightheaded Kidney injury (glomerulonephritis)--decrease in the amount of urine, red or dark brown urine, foamy or bubbly urine, swelling of the ankles, hands, or feet Liver injury--right upper belly pain, loss of appetite, nausea, light-colored stool, dark yellow or brown urine, yellowing skin or eyes, unusual weakness or fatigue Pain, tingling, or numbness in the hands or feet, muscle weakness, change in vision, confusion or trouble speaking, loss of balance or coordination, trouble walking, seizures Rash, fever, and swollen lymph nodes Redness, blistering, peeling, or loosening of the skin, including inside the mouth Sudden or severe stomach pain, bloody diarrhea, fever, nausea, vomiting Side effects that usually do not require medical attention (report these to your care team if they continue or are bothersome): Bone, joint, or muscle pain Diarrhea Fatigue Loss of appetite Nausea Skin rash This list may not describe all possible side effects. Call your doctor for medical advice about side effects. You may report side effects to FDA at 1-800-FDA-1088. Where should I keep my medication? This medication is given in a hospital or clinic. It will not be stored at home. NOTE: This sheet is a summary. It may not cover all possible information. If you have questions about this medicine, talk to your doctor, pharmacist, or health care provider.  2024 Elsevier/Gold Standard (2022-03-21 00:00:00)

## 2024-05-27 ENCOUNTER — Other Ambulatory Visit: Payer: Self-pay

## 2024-05-29 ENCOUNTER — Telehealth: Payer: Self-pay

## 2024-05-29 NOTE — Telephone Encounter (Signed)
 Discussed with Dr. Ezzard, he didn't feel this would be possible to say unless any imaging was done. Patient's wife confirmed that none was done, but I did advise that Dr. Ezzard said if they wanted to have imaging ordered we could do that. They would like for imaging on his back, advised I would confirm with Dr. Ezzard, and give them a call.

## 2024-05-29 NOTE — Telephone Encounter (Signed)
 Becky, patient's wife, left voicemail to advise that she had taken Jamile to [facility] due to his back pain. She said that they wouldn't do anything because they were concerned that his cancer had spread to his back.

## 2024-05-30 ENCOUNTER — Other Ambulatory Visit: Payer: Self-pay

## 2024-05-30 ENCOUNTER — Encounter: Payer: Self-pay | Admitting: Oncology

## 2024-05-30 DIAGNOSIS — C3412 Malignant neoplasm of upper lobe, left bronchus or lung: Secondary | ICD-10-CM

## 2024-05-30 DIAGNOSIS — M545 Low back pain, unspecified: Secondary | ICD-10-CM

## 2024-05-30 NOTE — Telephone Encounter (Signed)
 Dr. Ezzard had me confirm the exact location of patient's back pain. Called and spoke with both [place] and [place]. She advised that he was showing the lower part of his back. I have ordered him to have a CT in the lumbar spine her at the [facility].

## 2024-06-01 ENCOUNTER — Other Ambulatory Visit: Payer: Self-pay

## 2024-06-05 ENCOUNTER — Ambulatory Visit (INDEPENDENT_AMBULATORY_CARE_PROVIDER_SITE_OTHER)
Admission: RE | Admit: 2024-06-05 | Discharge: 2024-06-05 | Disposition: A | Discharge status: Home / Self Care | Source: Ambulatory Visit | Attending: Oncology | Admitting: Oncology

## 2024-06-05 ENCOUNTER — Other Ambulatory Visit: Payer: Self-pay | Admitting: Hematology and Oncology

## 2024-06-05 DIAGNOSIS — G620 Drug-induced polyneuropathy: Secondary | ICD-10-CM

## 2024-06-05 DIAGNOSIS — C3412 Malignant neoplasm of upper lobe, left bronchus or lung: Secondary | ICD-10-CM

## 2024-06-05 DIAGNOSIS — M545 Low back pain, unspecified: Secondary | ICD-10-CM

## 2024-06-05 MED ORDER — IOHEXOL 300 MG/ML  SOLN
100.0000 mL | Freq: Once | INTRAMUSCULAR | Status: AC | PRN
  Administered 2024-06-05: 100 mL via INTRAVENOUS

## 2024-06-10 ENCOUNTER — Telehealth: Payer: Self-pay

## 2024-06-10 NOTE — Telephone Encounter (Signed)
 Per Dr. Ezzard, no signs of cancer in spine, does reveal compression fracture, which if pain in significant could refer to Interventional Radiology for evaluation for  kyphoplasty.    Spoke with Mr. Manuel Sanchez gave him the above information.  He stated that pain is tolerable at this time.

## 2024-06-22 NOTE — Progress Notes (Unsigned)
 [facility]  [address] [place],  [place]  [address] [phone]  Clinic Day:  06/23/2024  Referring physician: Thurmond Cathlyn LABOR., MD   HISTORY OF PRESENT ILLNESS:  The patient is a 78 y.o. male with limited stage small cell lung cancer.  He comes in today to be evaluated before heading into his 9th cycle of maintenance durvalumab  immunotherapy.  His 8th cycle was delayed due to eye surgery in June. He initially completed definitive chemoradiation in October 2024 for his disease.  His chemotherapy consisted of 4 cycles of cisplatin /etoposide .  Since his last visit, the patient has been doing fairly well.  He reports stable, chronic shortness of breath and cough occasionally productive of clear sputum.  He had severe lower back pain earlier this month.  CT lumbar spine revealed an acute to subacute compression fracture of L1.  There was no evidence of metastatic disease.  The patient states his pain has improved greatly.  He occasionally uses ibuprofen. He states his peripheral neuropathy has not improved with increasing gabapentin  300 mg to three times a day. His wife accompanies him today and states does very little during the day. He often falls asleep in his recliner.  She states they have not been able to get his inhaler that helps the most with his breathing.  VITALS:   Blood pressure 118/61, pulse 66, temperature 97.6 F (36.4 C), temperature source Oral, resp. rate 20, height 5' 6 (1.676 m), weight 179 lb 3.2 oz (81.3 kg), SpO2 98%. Wt Readings from Last 3 Encounters:  06/23/24 179 lb 3.2 oz (81.3 kg)  05/26/24 179 lb 14.4 oz (81.6 kg)  04/21/24 182 lb 11.2 oz (82.9 kg)   Body mass index is 28.92 kg/m.  Performance status (ECOG): 2 - Symptomatic, <50% confined to bed  PHYSICAL EXAM:   Physical Exam Vitals and nursing note reviewed.  Constitutional:      General: He is not in acute distress.    Appearance: Normal appearance. He is normal weight.  HENT:      Head: Normocephalic and atraumatic.     Mouth/Throat:     Mouth: Mucous membranes are moist.     Pharynx: Oropharynx is clear. No oropharyngeal exudate or posterior oropharyngeal erythema.  Eyes:     General: No scleral icterus.    Extraocular Movements: Extraocular movements intact.     Conjunctiva/sclera: Conjunctivae normal.     Pupils: Pupils are equal, round, and reactive to light.  Cardiovascular:     Rate and Rhythm: Normal rate and regular rhythm.     Heart sounds: Normal heart sounds. No murmur heard.    No friction rub. No gallop.  Pulmonary:     Effort: Pulmonary effort is normal.     Breath sounds: Transmitted upper airway sounds present. Wheezing present. No rhonchi or rales.  Abdominal:     General: Bowel sounds are normal. There is no distension.     Palpations: Abdomen is soft. There is no mass.     Tenderness: There is no abdominal tenderness.  Musculoskeletal:        General: Normal range of motion.     Cervical back: Normal range of motion and neck supple. No tenderness.     Right lower leg: No edema.     Left lower leg: No edema.  Lymphadenopathy:     Cervical: No cervical adenopathy.  Skin:    General: Skin is warm and dry.     Coloration: Skin is not  jaundiced.     Findings: No rash.  Neurological:     Mental Status: He is alert and oriented to person, place, and time.     Cranial Nerves: No cranial nerve deficit.  Psychiatric:        Mood and Affect: Mood normal.        Behavior: Behavior normal.        Thought Content: Thought content normal.      LABS:      Latest Ref Rng & Units 06/23/2024    9:33 AM 05/26/2024    9:34 AM 04/21/2024    8:02 AM  CBC  WBC 4.0 - 10.5 K/uL 6.9  7.7  7.5   Hemoglobin 13.0 - 17.0 g/dL 87.5  87.9  87.8   Hematocrit 39.0 - 52.0 % 39.2  37.2  37.5   Platelets 150 - 400 K/uL 207  199  222       Latest Ref Rng & Units 05/26/2024    9:34 AM 04/21/2024    8:02 AM 03/24/2024    8:59 AM  CMP  Glucose 70 - 99 mg/dL 95  887   880   BUN 8 - 23 mg/dL 13  15  13    Creatinine 0.61 - 1.24 mg/dL 8.93  9.02  9.09   Sodium 135 - 145 mmol/L 139  139  138   Potassium 3.5 - 5.1 mmol/L 4.2  4.0  3.9   Chloride 98 - 111 mmol/L 104  100  100   CO2 22 - 32 mmol/L 24  27  29    Calcium 8.9 - 10.3 mg/dL 9.6  89.9  89.8   Total Protein 6.5 - 8.1 g/dL 7.0  7.2  7.1   Total Bilirubin 0.0 - 1.2 mg/dL 0.5  0.3  0.4   Alkaline Phos 38 - 126 U/L 153  149  150   AST 15 - 41 U/L 15  17  22    ALT 0 - 44 U/L 5  6  7       Review Flowsheet        No data to display           STUDIES:   CT Lumbar Spine W Contrast Result Date: 06/05/2024 CLINICAL DATA:  78 year old male with 1 week of low back pain. History of lung cancer. EXAM: CT LUMBAR SPINE WITH CONTRAST TECHNIQUE: Multidetector CT imaging of the lumbar spine was performed with intravenous contrast administration. RADIATION DOSE REDUCTION: This exam was performed according to the departmental dose-optimization program which includes automated exposure control, adjustment of the mA and/or kV according to patient size and/or use of iterative reconstruction technique. CONTRAST:  OMNIPAQUE  IOHEXOL  300 MG/ML  SOLN COMPARISON:  Chest CT 01/24/2024. CT Abdomen and Pelvis 10/27/2023. FINDINGS: Segmentation: Normal on the comparisons. Alignment: Stable lumbar lordosis from last year. Vertebrae: L1 inferior endplate compression fracture with 20% loss of height is new since 01/24/2024. Un healed inferior endplate fracture fragments (series 603, image 42). No retropulsion. L1 posterior elements appear intact and aligned. Underlying generalized osteopenia. T12 vertebra appears stable and intact. L2 through L5 appears stable and intact. Intact visible sacrum and SI joints. Paraspinal and other soft tissues: Aortoiliac calcified atherosclerosis. Diverticulosis of the distal large bowel. Negative lumbar paraspinal soft tissues. Disc levels: Mild for age lumbar spine degeneration above L5-S1.  L5-S1 chronic disc space loss, vacuum disc. Mild multifactorial spinal stenosis at L3-L4 related to disc bulging and posterior element hypertrophy. No other significant lumbar spinal stenosis. IMPRESSION:  1. Acute or subacute appearing L1 inferior endplate compression fracture with 20% loss of height. Favor benign osteoporotic fracture. No retropulsion or other complicating features. 2. Osteopenia. No other acute osseous abnormality in the Lumbar spine. 3. Generally mild for age lumbar spine degeneration. Mild multifactorial spinal stenosis suspected at L3-L4. 4.  Aortic Atherosclerosis (ICD10-I70.0). Electronically Signed   By: VEAR Hurst M.D.   On: 06/05/2024 09:41      ASSESSMENT & PLAN:   Assessment/Plan:  78 y.o. male with limited stage small cell lung cancer.  He has a new compression fracture of L1, but his pain has improved.  He has persistent peripheral neuropathy despite increasing his gabapentin  to 300 mg 3 times daily.  I told him he could increase this and 2 capsules at bedtime.  Clinically, he is otherwise doing fairly well.  He will proceed with an 9th cycle of durvalumab  today.  We will plan to see him back in 4 weeks prior to his 10th cycle the patient and his wife understand all the plans discussed today and are in agreement with them.  They know to contact our office if he develops concerns prior to his next appointment.     Andrez DELENA Foy, PA-C   Physician Assistant [facility] [phone]

## 2024-06-23 ENCOUNTER — Inpatient Hospital Stay

## 2024-06-23 ENCOUNTER — Inpatient Hospital Stay: Attending: Oncology

## 2024-06-23 ENCOUNTER — Inpatient Hospital Stay: Admitting: Hematology and Oncology

## 2024-06-23 ENCOUNTER — Encounter: Payer: Self-pay | Admitting: Hematology and Oncology

## 2024-06-23 VITALS — BP 118/61 | HR 66 | Temp 97.6°F | Resp 20 | Ht 66.0 in | Wt 179.2 lb

## 2024-06-23 DIAGNOSIS — C3412 Malignant neoplasm of upper lobe, left bronchus or lung: Secondary | ICD-10-CM | POA: Diagnosis not present

## 2024-06-23 DIAGNOSIS — Z79620 Long term (current) use of immunosuppressive biologic: Secondary | ICD-10-CM | POA: Insufficient documentation

## 2024-06-23 DIAGNOSIS — Z9221 Personal history of antineoplastic chemotherapy: Secondary | ICD-10-CM | POA: Diagnosis not present

## 2024-06-23 DIAGNOSIS — G629 Polyneuropathy, unspecified: Secondary | ICD-10-CM | POA: Diagnosis not present

## 2024-06-23 DIAGNOSIS — I700 Atherosclerosis of aorta: Secondary | ICD-10-CM | POA: Insufficient documentation

## 2024-06-23 DIAGNOSIS — I2510 Atherosclerotic heart disease of native coronary artery without angina pectoris: Secondary | ICD-10-CM | POA: Diagnosis not present

## 2024-06-23 DIAGNOSIS — K5730 Diverticulosis of large intestine without perforation or abscess without bleeding: Secondary | ICD-10-CM | POA: Diagnosis not present

## 2024-06-23 DIAGNOSIS — M8580 Other specified disorders of bone density and structure, unspecified site: Secondary | ICD-10-CM | POA: Insufficient documentation

## 2024-06-23 DIAGNOSIS — Z923 Personal history of irradiation: Secondary | ICD-10-CM | POA: Diagnosis not present

## 2024-06-23 DIAGNOSIS — C7951 Secondary malignant neoplasm of bone: Secondary | ICD-10-CM | POA: Insufficient documentation

## 2024-06-23 DIAGNOSIS — Z5112 Encounter for antineoplastic immunotherapy: Secondary | ICD-10-CM | POA: Insufficient documentation

## 2024-06-23 LAB — CBC WITH DIFFERENTIAL (CANCER CENTER ONLY)
Abs Immature Granulocytes: 0.03 K/uL (ref 0.00–0.07)
Basophils Absolute: 0.1 K/uL (ref 0.0–0.1)
Basophils Relative: 1 %
Eosinophils Absolute: 0.3 K/uL (ref 0.0–0.5)
Eosinophils Relative: 4 %
HCT: 39.2 % (ref 39.0–52.0)
Hemoglobin: 12.4 g/dL — ABNORMAL LOW (ref 13.0–17.0)
Immature Granulocytes: 0 %
Lymphocytes Relative: 21 %
Lymphs Abs: 1.5 K/uL (ref 0.7–4.0)
MCH: 28.1 pg (ref 26.0–34.0)
MCHC: 31.6 g/dL (ref 30.0–36.0)
MCV: 88.7 fL (ref 80.0–100.0)
Monocytes Absolute: 0.6 K/uL (ref 0.1–1.0)
Monocytes Relative: 9 %
Neutro Abs: 4.5 K/uL (ref 1.7–7.7)
Neutrophils Relative %: 65 %
Platelet Count: 207 K/uL (ref 150–400)
RBC: 4.42 MIL/uL (ref 4.22–5.81)
RDW: 15.2 % (ref 11.5–15.5)
WBC Count: 6.9 K/uL (ref 4.0–10.5)
nRBC: 0 % (ref 0.0–0.2)

## 2024-06-23 LAB — CMP (CANCER CENTER ONLY)
ALT: 5 U/L (ref 0–44)
AST: 18 U/L (ref 15–41)
Albumin: 4 g/dL (ref 3.5–5.0)
Alkaline Phosphatase: 161 U/L — ABNORMAL HIGH (ref 38–126)
Anion gap: 11 (ref 5–15)
BUN: 20 mg/dL (ref 8–23)
CO2: 27 mmol/L (ref 22–32)
Calcium: 9.6 mg/dL (ref 8.9–10.3)
Chloride: 101 mmol/L (ref 98–111)
Creatinine: 0.95 mg/dL (ref 0.61–1.24)
GFR, Estimated: >60 mL/min (ref >60)
Glucose, Bld: 100 mg/dL — ABNORMAL HIGH (ref 70–99)
Potassium: 3.9 mmol/L (ref 3.5–5.1)
Sodium: 139 mmol/L (ref 135–145)
Total Bilirubin: 0.5 mg/dL (ref 0.0–1.2)
Total Protein: 7.3 g/dL (ref 6.5–8.1)

## 2024-06-23 LAB — TSH: TSH: 3.398 u[IU]/mL (ref 0.350–4.500)

## 2024-06-23 MED ORDER — SODIUM CHLORIDE 0.9 % IV SOLN: INTRAVENOUS | Status: DC

## 2024-06-23 MED ORDER — SODIUM CHLORIDE 0.9% FLUSH
10.0000 mL | INTRAVENOUS | Status: DC | PRN
Start: 2024-06-23 — End: 2024-06-23

## 2024-06-23 MED ORDER — SODIUM CHLORIDE 0.9 % IV SOLN
1500.0000 mg | Freq: Once | INTRAVENOUS | Status: AC
  Administered 2024-06-23: 1500 mg via INTRAVENOUS
  Filled 2024-06-23: qty 30

## 2024-06-23 NOTE — Patient Instructions (Signed)
 Durvalumab Injection What is this medication? DURVALUMAB (dur VAL ue mab) treats some types of cancer. It works by helping your immune system slow or stop the spread of cancer cells. It is a monoclonal antibody. This medicine may be used for other purposes; ask your health care provider or pharmacist if you have questions. COMMON BRAND NAME(S): IMFINZI What should I tell my care team before I take this medication? They need to know if you have any of these conditions: Allogeneic stem cell transplant (uses someone else's stem cells) Autoimmune diseases, such as Crohn disease, ulcerative colitis, lupus History of chest radiation Nervous system problems, such as Guillain-Barre syndrome, myasthenia gravis Organ transplant An unusual or allergic reaction to durvalumab, other medications, foods, dyes, or preservatives Pregnant or trying to get pregnant Breast-feeding How should I use this medication? This medication is infused into a vein. It is given by your care team in a hospital or clinic setting. A special MedGuide will be given to you before each treatment. Be sure to read this information carefully each time. Talk to your care team about the use of this medication in children. Special care may be needed. Overdosage: If you think you have taken too much of this medicine contact a poison control center or emergency room at once. NOTE: This medicine is only for you. Do not share this medicine with others. What if I miss a dose? Keep appointments for follow-up doses. It is important not to miss your dose. Call your care team if you are unable to keep an appointment. What may interact with this medication? Interactions have not been studied. This list may not describe all possible interactions. Give your health care provider a list of all the medicines, herbs, non-prescription drugs, or dietary supplements you use. Also tell them if you smoke, drink alcohol, or use illegal drugs. Some items may  interact with your medicine. What should I watch for while using this medication? Your condition will be monitored carefully while you are receiving this medication. You may need blood work while taking this medication. This medication may cause serious skin reactions. They can happen weeks to months after starting the medication. Contact your care team right away if you notice fevers or flu-like symptoms with a rash. The rash may be red or purple and then turn into blisters or peeling of the skin. You may also notice a red rash with swelling of the face, lips, or lymph nodes in your neck or under your arms. Tell your care team right away if you have any change in your eyesight. Talk to your care team if you may be pregnant. Serious birth defects can occur if you take this medication during pregnancy and for 3 months after the last dose. You will need a negative pregnancy test before starting this medication. Contraception is recommended while taking this medication and for 3 months after the last dose. Your care team can help you find the option that works for you. Do not breastfeed while taking this medication and for 3 months after the last dose. What side effects may I notice from receiving this medication? Side effects that you should report to your care team as soon as possible: Allergic reactions--skin rash, itching, hives, swelling of the face, lips, tongue, or throat Dry cough, shortness of breath or trouble breathing Eye pain, redness, irritation, or discharge with blurry or decreased vision Heart muscle inflammation--unusual weakness or fatigue, shortness of breath, chest pain, fast or irregular heartbeat, dizziness, swelling of the  ankles, feet, or hands Hormone gland problems--headache, sensitivity to light, unusual weakness or fatigue, dizziness, fast or irregular heartbeat, increased sensitivity to cold or heat, excessive sweating, constipation, hair loss, increased thirst or amount of  urine, tremors or shaking, irritability Infusion reactions--chest pain, shortness of breath or trouble breathing, feeling faint or lightheaded Kidney injury (glomerulonephritis)--decrease in the amount of urine, red or dark brown urine, foamy or bubbly urine, swelling of the ankles, hands, or feet Liver injury--right upper belly pain, loss of appetite, nausea, light-colored stool, dark yellow or brown urine, yellowing skin or eyes, unusual weakness or fatigue Pain, tingling, or numbness in the hands or feet, muscle weakness, change in vision, confusion or trouble speaking, loss of balance or coordination, trouble walking, seizures Rash, fever, and swollen lymph nodes Redness, blistering, peeling, or loosening of the skin, including inside the mouth Sudden or severe stomach pain, bloody diarrhea, fever, nausea, vomiting Side effects that usually do not require medical attention (report these to your care team if they continue or are bothersome): Bone, joint, or muscle pain Diarrhea Fatigue Loss of appetite Nausea Skin rash This list may not describe all possible side effects. Call your doctor for medical advice about side effects. You may report side effects to FDA at 1-800-FDA-1088. Where should I keep my medication? This medication is given in a hospital or clinic. It will not be stored at home. NOTE: This sheet is a summary. It may not cover all possible information. If you have questions about this medicine, talk to your doctor, pharmacist, or health care provider.  2024 Elsevier/Gold Standard (2022-03-21 00:00:00)

## 2024-06-24 ENCOUNTER — Other Ambulatory Visit: Payer: Self-pay

## 2024-06-24 LAB — T4: T4, Total: 8.5 ug/dL (ref 4.5–12.0)

## 2024-07-20 ENCOUNTER — Other Ambulatory Visit: Payer: Self-pay

## 2024-07-21 NOTE — Progress Notes (Unsigned)
 [facility]  [address] [place],  [place]  [address] [phone]  Clinic Day:  07/22/2024  Referring physician: Thurmond Cathlyn LABOR., MD   HISTORY OF PRESENT ILLNESS:  The patient is a 78 y.o. male with limited stage small cell lung cancer.  He comes in today to be evaluated before heading into his 10th cycle of maintenance durvalumab  immunotherapy.  He initially completed definitive chemoradiation in October 2024 for his disease.  His chemotherapy consisted of 4 cycles of cisplatin /etoposide .  Since his last visit, the patient has been doing fairly well.  He denies having any worsening respiratory symptoms which concern him for disease progression.  His main complaint today is worsening peripheral neuropathy.  Of note, he takes Lyrica  50 mg 3 times daily.   PHYSICAL EXAM:  There were no vitals taken for this visit. Wt Readings from Last 3 Encounters:  06/23/24 179 lb 3.2 oz (81.3 kg)  05/26/24 179 lb 14.4 oz (81.6 kg)  04/21/24 182 lb 11.2 oz (82.9 kg)   There is no height or weight on file to calculate BMI. Performance status (ECOG): 1 - Symptomatic but completely ambulatory Physical Exam Constitutional:      Appearance: Normal appearance. He is not ill-appearing.  HENT:     Mouth/Throat:     Mouth: Mucous membranes are moist.     Pharynx: Oropharynx is clear. No oropharyngeal exudate or posterior oropharyngeal erythema.  Cardiovascular:     Rate and Rhythm: Normal rate and regular rhythm.     Heart sounds: No murmur heard.    No friction rub. No gallop.  Pulmonary:     Effort: Pulmonary effort is normal. No respiratory distress.     Breath sounds: Normal breath sounds. No wheezing, rhonchi or rales.  Abdominal:     General: Bowel sounds are normal. There is no distension.     Palpations: Abdomen is soft. There is no mass.     Tenderness: There is no abdominal tenderness.  Musculoskeletal:        General: No swelling.     Right lower leg:  No edema.     Left lower leg: No edema.  Lymphadenopathy:     Cervical: No cervical adenopathy.     Upper Body:     Right upper body: No supraclavicular or axillary adenopathy.     Left upper body: No supraclavicular or axillary adenopathy.     Lower Body: No right inguinal adenopathy. No left inguinal adenopathy.  Skin:    General: Skin is warm.     Coloration: Skin is not jaundiced.     Findings: No lesion or rash.  Neurological:     General: No focal deficit present.     Mental Status: He is alert and oriented to person, place, and time. Mental status is at baseline.  Psychiatric:        Mood and Affect: Mood normal.        Behavior: Behavior normal.        Thought Content: Thought content normal.    LABS:      Latest Ref Rng & Units 06/23/2024    9:33 AM 05/26/2024    9:34 AM 04/21/2024    8:02 AM  CBC  WBC 4.0 - 10.5 K/uL 6.9  7.7  7.5   Hemoglobin 13.0 - 17.0 g/dL 87.5  87.9  87.8   Hematocrit 39.0 - 52.0 % 39.2  37.2  37.5   Platelets 150 - 400 K/uL 207  199  222       Latest Ref Rng & Units 06/23/2024    9:33 AM 05/26/2024    9:34 AM 04/21/2024    8:02 AM  CMP  Glucose 70 - 99 mg/dL 899  95  887   BUN 8 - 23 mg/dL 20  13  15    Creatinine 0.61 - 1.24 mg/dL 9.04  8.93  9.02   Sodium 135 - 145 mmol/L 139  139  139   Potassium 3.5 - 5.1 mmol/L 3.9  4.2  4.0   Chloride 98 - 111 mmol/L 101  104  100   CO2 22 - 32 mmol/L 27  24  27    Calcium 8.9 - 10.3 mg/dL 9.6  9.6  89.9   Total Protein 6.5 - 8.1 g/dL 7.3  7.0  7.2   Total Bilirubin 0.0 - 1.2 mg/dL 0.5  0.5  0.3   Alkaline Phos 38 - 126 U/L 161  153  149   AST 15 - 41 U/L 18  15  17    ALT 0 - 44 U/L 5  <5  6    ASSESSMENT & PLAN:  A 78 y.o. male with limited stage small cell lung cancer.  He will proceed with his 6th cycle of maintenance Durvalumab  immunotherapy today.  As mentioned previously, this patient already completed his chemoradiation in October 2024.  With respect to his peripheral neuropathy, the patient knows  to increase his Lyrica  to 100 mg twice daily.  Overall, he looks fine today.  I will see him back in 4 weeks before he heads into his 7th cycle of maintenance durvalumab  immunotherapy.  The patient and his family understand all the plans discussed today and are in agreement with them.   Kathaleya Mcduffee DELENA Kerns, MD

## 2024-07-22 ENCOUNTER — Telehealth: Payer: Self-pay | Admitting: Oncology

## 2024-07-22 ENCOUNTER — Inpatient Hospital Stay

## 2024-07-22 ENCOUNTER — Inpatient Hospital Stay: Attending: Oncology

## 2024-07-22 ENCOUNTER — Inpatient Hospital Stay (HOSPITAL_BASED_OUTPATIENT_CLINIC_OR_DEPARTMENT_OTHER): Admitting: Oncology

## 2024-07-22 VITALS — BP 128/67 | HR 62 | Temp 98.0°F | Resp 22 | Ht 66.0 in | Wt 179.6 lb

## 2024-07-22 DIAGNOSIS — Z9221 Personal history of antineoplastic chemotherapy: Secondary | ICD-10-CM | POA: Insufficient documentation

## 2024-07-22 DIAGNOSIS — C3412 Malignant neoplasm of upper lobe, left bronchus or lung: Secondary | ICD-10-CM | POA: Insufficient documentation

## 2024-07-22 DIAGNOSIS — Z5112 Encounter for antineoplastic immunotherapy: Secondary | ICD-10-CM | POA: Diagnosis present

## 2024-07-22 LAB — CBC WITH DIFFERENTIAL (CANCER CENTER ONLY)
Abs Immature Granulocytes: 0.02 K/uL (ref 0.00–0.07)
Basophils Absolute: 0.1 K/uL (ref 0.0–0.1)
Basophils Relative: 1 %
Eosinophils Absolute: 0.3 K/uL (ref 0.0–0.5)
Eosinophils Relative: 4 %
HCT: 39.4 % (ref 39.0–52.0)
Hemoglobin: 12.7 g/dL — ABNORMAL LOW (ref 13.0–17.0)
Immature Granulocytes: 0 %
Lymphocytes Relative: 26 %
Lymphs Abs: 1.8 K/uL (ref 0.7–4.0)
MCH: 28.4 pg (ref 26.0–34.0)
MCHC: 32.2 g/dL (ref 30.0–36.0)
MCV: 88.1 fL (ref 80.0–100.0)
Monocytes Absolute: 0.8 K/uL (ref 0.1–1.0)
Monocytes Relative: 11 %
Neutro Abs: 3.9 K/uL (ref 1.7–7.7)
Neutrophils Relative %: 58 %
Platelet Count: 192 K/uL (ref 150–400)
RBC: 4.47 MIL/uL (ref 4.22–5.81)
RDW: 15.1 % (ref 11.5–15.5)
WBC Count: 6.7 K/uL (ref 4.0–10.5)
nRBC: 0 % (ref 0.0–0.2)

## 2024-07-22 LAB — CMP (CANCER CENTER ONLY)
ALT: <5 U/L (ref 0–44)
AST: 14 U/L — ABNORMAL LOW (ref 15–41)
Albumin: 4.2 g/dL (ref 3.5–5.0)
Alkaline Phosphatase: 145 U/L — ABNORMAL HIGH (ref 38–126)
Anion gap: 10 (ref 5–15)
BUN: 12 mg/dL (ref 8–23)
CO2: 27 mmol/L (ref 22–32)
Calcium: 10 mg/dL (ref 8.9–10.3)
Chloride: 103 mmol/L (ref 98–111)
Creatinine: 0.99 mg/dL (ref 0.61–1.24)
GFR, Estimated: >60 mL/min (ref >60)
Glucose, Bld: 63 mg/dL — ABNORMAL LOW (ref 70–99)
Potassium: 4 mmol/L (ref 3.5–5.1)
Sodium: 140 mmol/L (ref 135–145)
Total Bilirubin: 0.3 mg/dL (ref 0.0–1.2)
Total Protein: 7.4 g/dL (ref 6.5–8.1)

## 2024-07-22 MED ORDER — SODIUM CHLORIDE 0.9 % IV SOLN
INTRAVENOUS | Status: DC
Start: 2024-07-22 — End: 2024-07-22

## 2024-07-22 MED ORDER — SODIUM CHLORIDE 0.9 % IV SOLN
1500.0000 mg | Freq: Once | INTRAVENOUS | Status: AC
  Administered 2024-07-22: 1500 mg via INTRAVENOUS
  Filled 2024-07-22: qty 30

## 2024-07-22 NOTE — Telephone Encounter (Signed)
 Patient has been scheduled for follow-up visit per 07/22/24 LOS.  Pt given an appt calendar with date and time.

## 2024-07-22 NOTE — Patient Instructions (Signed)
 Durvalumab Injection What is this medication? DURVALUMAB (dur VAL ue mab) treats some types of cancer. It works by helping your immune system slow or stop the spread of cancer cells. It is a monoclonal antibody. This medicine may be used for other purposes; ask your health care provider or pharmacist if you have questions. COMMON BRAND NAME(S): IMFINZI What should I tell my care team before I take this medication? They need to know if you have any of these conditions: Allogeneic stem cell transplant (uses someone else's stem cells) Autoimmune diseases, such as Crohn disease, ulcerative colitis, lupus History of chest radiation Nervous system problems, such as Guillain-Barre syndrome, myasthenia gravis Organ transplant An unusual or allergic reaction to durvalumab, other medications, foods, dyes, or preservatives Pregnant or trying to get pregnant Breast-feeding How should I use this medication? This medication is infused into a vein. It is given by your care team in a hospital or clinic setting. A special MedGuide will be given to you before each treatment. Be sure to read this information carefully each time. Talk to your care team about the use of this medication in children. Special care may be needed. Overdosage: If you think you have taken too much of this medicine contact a poison control center or emergency room at once. NOTE: This medicine is only for you. Do not share this medicine with others. What if I miss a dose? Keep appointments for follow-up doses. It is important not to miss your dose. Call your care team if you are unable to keep an appointment. What may interact with this medication? Interactions have not been studied. This list may not describe all possible interactions. Give your health care provider a list of all the medicines, herbs, non-prescription drugs, or dietary supplements you use. Also tell them if you smoke, drink alcohol, or use illegal drugs. Some items may  interact with your medicine. What should I watch for while using this medication? Your condition will be monitored carefully while you are receiving this medication. You may need blood work while taking this medication. This medication may cause serious skin reactions. They can happen weeks to months after starting the medication. Contact your care team right away if you notice fevers or flu-like symptoms with a rash. The rash may be red or purple and then turn into blisters or peeling of the skin. You may also notice a red rash with swelling of the face, lips, or lymph nodes in your neck or under your arms. Tell your care team right away if you have any change in your eyesight. Talk to your care team if you may be pregnant. Serious birth defects can occur if you take this medication during pregnancy and for 3 months after the last dose. You will need a negative pregnancy test before starting this medication. Contraception is recommended while taking this medication and for 3 months after the last dose. Your care team can help you find the option that works for you. Do not breastfeed while taking this medication and for 3 months after the last dose. What side effects may I notice from receiving this medication? Side effects that you should report to your care team as soon as possible: Allergic reactions--skin rash, itching, hives, swelling of the face, lips, tongue, or throat Dry cough, shortness of breath or trouble breathing Eye pain, redness, irritation, or discharge with blurry or decreased vision Heart muscle inflammation--unusual weakness or fatigue, shortness of breath, chest pain, fast or irregular heartbeat, dizziness, swelling of the  ankles, feet, or hands Hormone gland problems--headache, sensitivity to light, unusual weakness or fatigue, dizziness, fast or irregular heartbeat, increased sensitivity to cold or heat, excessive sweating, constipation, hair loss, increased thirst or amount of  urine, tremors or shaking, irritability Infusion reactions--chest pain, shortness of breath or trouble breathing, feeling faint or lightheaded Kidney injury (glomerulonephritis)--decrease in the amount of urine, red or dark brown urine, foamy or bubbly urine, swelling of the ankles, hands, or feet Liver injury--right upper belly pain, loss of appetite, nausea, light-colored stool, dark yellow or brown urine, yellowing skin or eyes, unusual weakness or fatigue Pain, tingling, or numbness in the hands or feet, muscle weakness, change in vision, confusion or trouble speaking, loss of balance or coordination, trouble walking, seizures Rash, fever, and swollen lymph nodes Redness, blistering, peeling, or loosening of the skin, including inside the mouth Sudden or severe stomach pain, bloody diarrhea, fever, nausea, vomiting Side effects that usually do not require medical attention (report these to your care team if they continue or are bothersome): Bone, joint, or muscle pain Diarrhea Fatigue Loss of appetite Nausea Skin rash This list may not describe all possible side effects. Call your doctor for medical advice about side effects. You may report side effects to FDA at 1-800-FDA-1088. Where should I keep my medication? This medication is given in a hospital or clinic. It will not be stored at home. NOTE: This sheet is a summary. It may not cover all possible information. If you have questions about this medicine, talk to your doctor, pharmacist, or health care provider.  2024 Elsevier/Gold Standard (2022-03-21 00:00:00)

## 2024-07-23 ENCOUNTER — Other Ambulatory Visit: Payer: Self-pay

## 2024-08-03 ENCOUNTER — Encounter (HOSPITAL_BASED_OUTPATIENT_CLINIC_OR_DEPARTMENT_OTHER): Payer: Self-pay

## 2024-08-03 ENCOUNTER — Ambulatory Visit (HOSPITAL_BASED_OUTPATIENT_CLINIC_OR_DEPARTMENT_OTHER): Admit: 2024-08-03 | Discharge: 2024-08-03 | Disposition: A | Discharge status: Home / Self Care | Admitting: Radiology

## 2024-08-03 ENCOUNTER — Ambulatory Visit (HOSPITAL_BASED_OUTPATIENT_CLINIC_OR_DEPARTMENT_OTHER)
Admission: EM | Admit: 2024-08-03 | Discharge: 2024-08-03 | Disposition: A | Discharge status: Home / Self Care | Attending: Family Medicine | Admitting: Family Medicine

## 2024-08-03 DIAGNOSIS — R051 Acute cough: Secondary | ICD-10-CM

## 2024-08-03 DIAGNOSIS — S2231XA Fracture of one rib, right side, initial encounter for closed fracture: Secondary | ICD-10-CM

## 2024-08-03 DIAGNOSIS — J441 Chronic obstructive pulmonary disease with (acute) exacerbation: Secondary | ICD-10-CM

## 2024-08-03 MED ORDER — TRIAMCINOLONE ACETONIDE 40 MG/ML IJ SUSP
40.0000 mg | Freq: Once | INTRAMUSCULAR | Status: AC
  Administered 2024-08-03: 40 mg via INTRAMUSCULAR

## 2024-08-03 MED ORDER — IPRATROPIUM-ALBUTEROL 0.5-2.5 (3) MG/3ML IN SOLN
3.0000 mL | Freq: Once | RESPIRATORY_TRACT | Status: AC
  Administered 2024-08-03: 3 mL via RESPIRATORY_TRACT

## 2024-08-03 MED ORDER — PREDNISONE 20 MG PO TABS: 40.0000 mg | ORAL_TABLET | Freq: Every day | ORAL | 0 refills | Status: AC

## 2024-08-03 MED ORDER — TRIAMCINOLONE ACETONIDE 40 MG/ML IJ SUSP
40.0000 mg | Freq: Once | INTRAMUSCULAR | Status: DC
Start: 2024-08-03 — End: 2024-08-03

## 2024-08-03 NOTE — ED Provider Notes (Signed)
 [facility]    CSN: [id number] Arrival date & time: 08/03/24  0900      History   Chief Complaint Chief Complaint  Patient presents with   Shortness of Breath   Cough    HPI Manuel Sanchez is a 78 y.o. male.   Patient is a 78 year old male who presents today with cough, shortness of breath, right rib pain.  Patient has hx of COPD /asthma. Increasing shortness of breath over last 3 weeks. Pain with deep breath to right rib area. Audible wheezing. Patient unable to speak in complete sentences without having to stop and catch breath.  No fevers, chills or bodyaches.  Low sats upon arrival.  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 BMI 29.19   COPD (chronic obstructive pulmonary disease) (HCC) 07/23/2019   Coronary artery disease involving native coronary artery of native heart without angina pectoris 04/22/2015   Formatting of this note might be different from the original. Calcification noted on CT of his chest in 2016 Formatting of this note might be different from the original. 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 [facility] in past.  Will see again.   Malaise and fatigue 04/24/2016   Mass of lower lobe of left lung 09/10/2019   Formatting of this note might be different from the original. Will need a follow up CT Pt aware   Mediastinal adenopathy 05/29/2023   Other nonspecific abnormal finding of lung field 04/11/2023   2.3 cm CT [facility] 03/2023.  Lymphadenopathy     Peripheral vascular disease (HCC) 04/24/2016   Formatting of this note might be different from the original. 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   Mediastinal adenopathy 05/29/2023   Other nonspecific abnormal finding of lung field 04/11/2023  GERD without esophagitis 08/19/2020   Essential hypertension 08/03/2020   Arthritis of foot, right, 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   Elevated blood-pressure reading without diagnosis of hypertension 01/22/2020   Abnormal nuclear stress test 10/20/2019   Iron deficiency anemia 09/10/2019   Mass of lower lobe of left lung 09/10/2019   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   Arthritis 07/23/2019   Chest discomfort 07/23/2019   Ex-smoker 07/23/2019   CAD (coronary artery  disease) 07/23/2019   COPD (chronic obstructive pulmonary disease) (HCC) 07/23/2019   Peripheral vascular disease, unspecified (HCC) 07/23/2019   Skin cancer of nose 02/13/2018   Arthritis of right acromioclavicular joint 05/18/2017   Chronic obstructive pulmonary disease (HCC) 08/23/2016   Peripheral vascular disease (HCC) 04/24/2016   Cyst of left kidney 04/24/2016   Hypercholesterolemia 04/24/2016   Vitamin B12 deficiency 04/24/2016   Class 1 obesity due to excess calories with serious comorbidity and body mass index (BMI) of 30.0 to 30.9 in adult 04/24/2016   High risk medication use 04/24/2016   Malaise and fatigue 04/24/2016   Pre-diabetes 04/24/2016   Coronary artery disease involving native coronary artery of native heart without angina pectoris 04/22/2015   Dyslipidemia 04/22/2015   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 Authorizing Provider  predniSONE  (DELTASONE ) 20 MG tablet Take 2 tablets (40 mg total) by mouth daily with breakfast for 5 days. 08/03/24 08/08/24 Yes Wiliam Cauthorn A, FNP  albuterol  (ACCUNEB ) 0.63 MG/3ML nebulizer solution Inhale 3 mLs into the lungs every 6 (six) hours as needed for wheezing. 08/19/20   [provider]  albuterol  (VENTOLIN  HFA) 108 (90 BASE) MCG/ACT inhaler Inhale 2 puffs into the lungs every 4 (four) hours as needed for wheezing or shortness of breath.     [provider]  atorvastatin (LIPITOR) 40 MG tablet Take 40 mg by mouth daily. 01/06/22   [provider]  budesonide -formoterol  (SYMBICORT ) 160-4.5 MCG/ACT inhaler Inhale 2 puffs into the lungs at bedtime. 01/03/22   [provider]  Calcium Carb-Cholecalciferol (OYSTER SHELL CALCIUM W/D) 500-5 MG-MCG TABS Take 1  tablet by mouth daily. 09/10/19   [provider]  cholecalciferol (VITAMIN D) 1000 UNITS tablet Take 1,000 Units by mouth daily.    [provider]  co-enzyme Q-10 30 MG capsule Take 30 mg by mouth daily.    [provider]  Cyanocobalamin (B-12) 2500 MCG TABS Take 1 tablet by mouth every morning.    [provider]  docusate sodium (COLACE) 100 MG capsule Take 100 mg by mouth at bedtime. 06/18/23   [provider]  ferrous sulfate 325 (65 FE) MG EC tablet Take 325 mg by mouth daily with breakfast.    [provider]  fluticasone (FLONASE) 50 MCG/ACT nasal spray Place 2 sprays into both nostrils daily. 04/09/23   [provider]  gabapentin  (NEURONTIN ) 300 MG capsule Take 1 capsule by mouth twice daily 06/05/24   Parsons, Melissa A, NP  ipratropium-albuterol  (DUONEB) 0.5-2.5 (3) MG/3ML SOLN Take 3 mLs by nebulization every 6 (six) hours.    [provider]  loperamide (IMODIUM A-D) 2 MG tablet Take 2 mg by mouth as directed. Take 2 tabs after initial diarrhea episode very morning, then 1 tab after each diarrhea  thereafter, up to 8 tabs per day, while on immunotherapy. 11/06/23   [provider]  magic mouthwash SOLN Take 5 mLs by mouth. Called into [facility] 07-05-2023.    [provider]  montelukast (SINGULAIR) 10 MG tablet Take 10 mg by mouth at bedtime. 01/03/22   [provider]  mupirocin ointment (BACTROBAN) 2 % Apply 1 Application topically 2 (two) times daily.    [provider]  nitroGLYCERIN  (NITROSTAT ) 0.4 MG SL tablet Place 0.4 mg under the tongue every 5 (five) minutes as needed. 04/27/20   [provider]  ondansetron  (ZOFRAN ) 8 MG tablet Take 8 mg by mouth every 8 (eight) hours as needed. 10/23/23   [provider]  pantoprazole (PROTONIX) 40 MG tablet Take 40 mg by mouth daily.    [provider]  Polyethylene Glycol 3350 (MIRALAX PO) Take by mouth as  needed.    [provider]  prochlorperazine  (COMPAZINE ) 10 MG tablet Take 10 mg by mouth every 6 (six) hours as needed. 10/23/23   [provider]  zinc gluconate 3.75 mg/mL SOLN Take 1 tablet by mouth every morning.    [provider]    Family History Family History  Problem Relation Age of Onset   Heart disease Mother    Stroke Mother    Hypertension Mother    Heart attack Mother    Hearing loss Mother    Colon cancer Father    Stomach cancer Sister    Lung cancer Sister     Social History Social History   Tobacco Use   Smoking status: Former    Current packs/day: 0.00    Average packs/day: 0.8 packs/day for 45.0 years (33.8 ttl pk-yrs)    Types: Cigarettes    Start date: 11/20/1957    Quit date: 11/20/2002    Years since quitting: 21.7   Smokeless tobacco: Never  Vaping Use   Vaping status: Never Used  Substance Use Topics   Alcohol use: No   Drug use: No     Allergies   Codeine, Levofloxacin , and Levofloxacin  in d5w   Review of Systems Review of Systems  Respiratory:  Positive for cough and shortness of breath.    See HPI  Physical Exam Triage Vital Signs ED Triage Vitals [08/03/24 1032]  Encounter Vitals Group     BP 135/63     Girls Systolic BP Percentile      Girls Diastolic BP Percentile      Boys Systolic BP Percentile      Boys Diastolic BP Percentile      Pulse Rate 61     Resp 20     Temp 97.8 F (36.6 C)     Temp Source Oral     SpO2 (!) 88 %     Weight      Height      Head Circumference      Peak Flow      Pain Score 9     Pain Loc      Pain Education      Exclude from Growth Chart    No data found.  Updated Vital Signs BP 135/63 (BP Location: Right Arm)   Pulse 67   Temp 97.8 F (36.6 C) (Oral)   Resp 20   SpO2 91%   Visual Acuity Right Eye Distance:   Left Eye Distance:   Bilateral Distance:    Right Eye Near:   Left Eye Near:    Bilateral Near:  Physical Exam Vitals and nursing  note reviewed.  Constitutional:      Appearance: He is not toxic-appearing or diaphoretic.  Pulmonary:     Effort: Tachypnea and accessory muscle usage present.     Breath sounds: Examination of the right-upper field reveals decreased breath sounds, wheezing and rhonchi. Examination of the left-upper field reveals decreased breath sounds, wheezing and rhonchi. Examination of the right-middle field reveals decreased breath sounds, wheezing and rhonchi. Examination of the left-middle field reveals decreased breath sounds, wheezing and rhonchi. Examination of the right-lower field reveals decreased breath sounds, wheezing and rhonchi. Examination of the left-lower field reveals decreased breath sounds, wheezing and rhonchi. Decreased breath sounds, wheezing and rhonchi present.  Skin:    General: Skin is warm and dry.  Neurological:     Mental Status: He is alert.      UC Treatments / Results  Labs (all labs ordered are listed, but only abnormal results are displayed) Labs Reviewed - No data to display  EKG   Radiology No results found.  Procedures Procedures (including critical care time)  Medications Ordered in UC Medications  ipratropium-albuterol  (DUONEB) 0.5-2.5 (3) MG/3ML nebulizer solution 3 mL (3 mLs Nebulization Given 08/03/24 1035)  triamcinolone  acetonide (KENALOG -40) injection 40 mg (40 mg Intramuscular Given 08/03/24 1137)    Initial Impression / Assessment and Plan / UC Course  I have reviewed the triage vital signs and the nursing notes.  Pertinent labs & imaging results that were available during my care of the patient were reviewed by me and considered in my medical decision making (see chart for details).  Clinical Course as of 08/06/24 9185  [place] Aug 03, 2024  1123 DG Ribs Unilateral W/Chest Right [TB]    Clinical Course User Index [TB] Adah Corning A, FNP    COPD exacerbation, rib fracture-x-ray positive for rib fracture.  Duo Neb given here today with  some improvement.   No concerns on x-ray for pneumonia.  Steroid injection given here for cough, wheezing. May use albuterol  at home as needed. Also sent to the pharmacy for daily doses of the next 5 days. Follow-up as needed  Final Clinical Impressions(s) / UC Diagnoses   Final diagnoses:  Acute cough  COPD exacerbation (HCC)  Closed fracture of one rib of right side, initial encounter     Discharge Instructions      You do have a rib fracture that is why you are having pain in the rib area.  You can take Tylenol extra strength for the pain as needed.  This should resolve over time Steroid injection given here today for lung inflammation No concerns for pneumonia Use your albuterol  at home as needed Follow-up with your doctor for any continued issues     ED Prescriptions     Medication Sig Dispense Auth. Provider   predniSONE  (DELTASONE ) 20 MG tablet Take 2 tablets (40 mg total) by mouth daily with breakfast for 5 days. 10 tablet Adah Corning LABOR, FNP      PDMP not reviewed this encounter.   Adah Corning LABOR, FNP 08/06/24 [phone]

## 2024-08-03 NOTE — ED Triage Notes (Signed)
 Patient has hx of asthma. Increasing shortness of breath over last 3 weeks. Pain with deep breath to right rib area. Audible wheezing. Patient unable to speak in complete sentences without having to stop and catch breath.

## 2024-08-03 NOTE — Discharge Instructions (Addendum)
 You do have a rib fracture that is why you are having pain in the rib area.  You can take Tylenol extra strength for the pain as needed.  This should resolve over time Steroid injection given here today for lung inflammation No concerns for pneumonia Use your albuterol  at home as needed Follow-up with your doctor for any continued issues

## 2024-08-15 ENCOUNTER — Other Ambulatory Visit: Payer: Self-pay

## 2024-08-18 NOTE — Progress Notes (Unsigned)
 [facility] at [facility] [address] [place],  [place]  [address] [phone]  Clinic Day:  08/19/2024  Referring physician: Thurmond Cathlyn LABOR., MD   HISTORY OF PRESENT ILLNESS:  The patient is a 78 y.o. male with limited stage small cell lung cancer.  He comes in today to be evaluated before heading into his 11th cycle of maintenance durvalumab  immunotherapy.  He initially completed definitive chemoradiation in October 2024 for his disease.  His chemotherapy consisted of 4 cycles of cisplatin /etoposide .  Since his last visit, the patient has been doing okay.  However, he recently cracked a few ribs as he landed onto the middle console of his car while reaching for something in the backseat.  However, he is also concerned about developing lower back pain which has come about without any premeditating factor.  With respect to his small cell lung cancer, he denies having any worsening respiratory symptoms which concern him for disease progression.   PHYSICAL EXAM:  Blood pressure (!) 145/68, pulse 66, temperature 98.6 F (37 C), temperature source Oral, resp. rate 18, height 5' 6 (1.676 m), weight 179 lb 8 oz (81.4 kg), SpO2 99%. Wt Readings from Last 3 Encounters:  08/19/24 180 lb (81.6 kg)  08/15/24 179 lb 8 oz (81.4 kg)  07/22/24 179 lb 9.6 oz (81.5 kg)   Body mass index is 28.97 kg/m. Performance status (ECOG): 1 - Symptomatic but completely ambulatory Physical Exam Constitutional:      Appearance: Normal appearance. He is not ill-appearing.  HENT:     Mouth/Throat:     Mouth: Mucous membranes are moist.     Pharynx: Oropharynx is clear. No oropharyngeal exudate or posterior oropharyngeal erythema.  Cardiovascular:     Rate and Rhythm: Normal rate and regular rhythm.     Heart sounds: No murmur heard.    No friction rub. No gallop.  Pulmonary:     Effort: Pulmonary effort is normal. No respiratory distress.     Breath sounds: No wheezing, rhonchi or rales.   Abdominal:     General: Bowel sounds are normal. There is no distension.     Palpations: Abdomen is soft. There is no mass.     Tenderness: There is no abdominal tenderness.  Musculoskeletal:        General: No swelling.     Right lower leg: No edema.     Left lower leg: No edema.  Lymphadenopathy:     Cervical: No cervical adenopathy.     Upper Body:     Right upper body: No supraclavicular or axillary adenopathy.     Left upper body: No supraclavicular or axillary adenopathy.     Lower Body: No right inguinal adenopathy. No left inguinal adenopathy.  Skin:    General: Skin is warm.     Coloration: Skin is not jaundiced.     Findings: No lesion or rash.  Neurological:     General: No focal deficit present.     Mental Status: He is alert and oriented to person, place, and time. Mental status is at baseline.  Psychiatric:        Mood and Affect: Mood normal.        Behavior: Behavior normal.        Thought Content: Thought content normal.    LABS:      Latest Ref Rng & Units 08/19/2024    9:24 AM 07/22/2024    9:10 AM 06/23/2024    9:33 AM  CBC  WBC 4.0 - 10.5 K/uL 7.3  6.7  6.9   Hemoglobin 13.0 - 17.0 g/dL 88.0  87.2  87.5   Hematocrit 39.0 - 52.0 % 37.6  39.4  39.2   Platelets 150 - 400 K/uL 181  192  207       Latest Ref Rng & Units 08/19/2024    9:24 AM 07/22/2024    9:10 AM 06/23/2024    9:33 AM  CMP  Glucose 70 - 99 mg/dL 896  63  899   BUN 8 - 23 mg/dL 17  12  20    Creatinine 0.61 - 1.24 mg/dL 8.94  9.00  9.04   Sodium 135 - 145 mmol/L 139  140  139   Potassium 3.5 - 5.1 mmol/L 3.9  4.0  3.9   Chloride 98 - 111 mmol/L 102  103  101   CO2 22 - 32 mmol/L 26  27  27    Calcium 8.9 - 10.3 mg/dL 9.8  89.9  9.6   Total Protein 6.5 - 8.1 g/dL 7.0  7.4  7.3   Total Bilirubin 0.0 - 1.2 mg/dL 0.3  0.3  0.5   Alkaline Phos 38 - 126 U/L 141  145  161   AST 15 - 41 U/L 23  14  18    ALT 0 - 44 U/L 8  <5  5    ASSESSMENT & PLAN:  A 78 y.o. male with limited stage small  cell lung cancer.  He will proceed with his 11th cycle of maintenance durvalumab  immunotherapy today.  With respect to his back pain, as small cell lung cancer has a predilection to spread to the bones, an MRI of his lumbosacral spine will be done within the next few days to rule out bone metastasis.  I will prescribe this gentleman hydrocodone/Tylenol 10/325 today, which he knows to take as needed for breakthrough pain.  Otherwise, I will see him back in 4 weeks before he heads into his 12th cycle of maintenance durvalumab  immunotherapy.  The patient and his family understand all the plans discussed today and are in agreement with them.   Yunique Dearcos DELENA Kerns, MD

## 2024-08-19 ENCOUNTER — Inpatient Hospital Stay (HOSPITAL_BASED_OUTPATIENT_CLINIC_OR_DEPARTMENT_OTHER): Admitting: Oncology

## 2024-08-19 ENCOUNTER — Inpatient Hospital Stay

## 2024-08-19 ENCOUNTER — Other Ambulatory Visit: Payer: Self-pay | Admitting: Oncology

## 2024-08-19 VITALS — BP 129/67 | HR 70 | Temp 98.0°F | Resp 18 | Ht 66.0 in | Wt 180.0 lb

## 2024-08-19 VITALS — BP 145/68 | HR 66 | Temp 98.6°F | Resp 18 | Ht 66.0 in | Wt 179.5 lb

## 2024-08-19 DIAGNOSIS — C3412 Malignant neoplasm of upper lobe, left bronchus or lung: Secondary | ICD-10-CM

## 2024-08-19 DIAGNOSIS — Z5112 Encounter for antineoplastic immunotherapy: Secondary | ICD-10-CM | POA: Diagnosis not present

## 2024-08-19 LAB — CBC WITH DIFFERENTIAL (CANCER CENTER ONLY)
Abs Immature Granulocytes: 0.03 K/uL (ref 0.00–0.07)
Basophils Absolute: 0.1 K/uL (ref 0.0–0.1)
Basophils Relative: 1 %
Eosinophils Absolute: 0.2 K/uL (ref 0.0–0.5)
Eosinophils Relative: 3 %
HCT: 37.6 % — ABNORMAL LOW (ref 39.0–52.0)
Hemoglobin: 11.9 g/dL — ABNORMAL LOW (ref 13.0–17.0)
Immature Granulocytes: 0 %
Lymphocytes Relative: 21 %
Lymphs Abs: 1.6 K/uL (ref 0.7–4.0)
MCH: 28.1 pg (ref 26.0–34.0)
MCHC: 31.6 g/dL (ref 30.0–36.0)
MCV: 88.7 fL (ref 80.0–100.0)
Monocytes Absolute: 0.7 K/uL (ref 0.1–1.0)
Monocytes Relative: 10 %
Neutro Abs: 4.7 K/uL (ref 1.7–7.7)
Neutrophils Relative %: 65 %
Platelet Count: 181 K/uL (ref 150–400)
RBC: 4.24 MIL/uL (ref 4.22–5.81)
RDW: 15.8 % — ABNORMAL HIGH (ref 11.5–15.5)
WBC Count: 7.3 K/uL (ref 4.0–10.5)
nRBC: 0 % (ref 0.0–0.2)

## 2024-08-19 LAB — CMP (CANCER CENTER ONLY)
ALT: 8 U/L (ref 0–44)
AST: 23 U/L (ref 15–41)
Albumin: 3.8 g/dL (ref 3.5–5.0)
Alkaline Phosphatase: 141 U/L — ABNORMAL HIGH (ref 38–126)
Anion gap: 11 (ref 5–15)
BUN: 17 mg/dL (ref 8–23)
CO2: 26 mmol/L (ref 22–32)
Calcium: 9.8 mg/dL (ref 8.9–10.3)
Chloride: 102 mmol/L (ref 98–111)
Creatinine: 1.05 mg/dL (ref 0.61–1.24)
GFR, Estimated: >60 mL/min (ref >60)
Glucose, Bld: 103 mg/dL — ABNORMAL HIGH (ref 70–99)
Potassium: 3.9 mmol/L (ref 3.5–5.1)
Sodium: 139 mmol/L (ref 135–145)
Total Bilirubin: 0.3 mg/dL (ref 0.0–1.2)
Total Protein: 7 g/dL (ref 6.5–8.1)

## 2024-08-19 MED ORDER — HYDROCODONE-ACETAMINOPHEN 10-325 MG PO TABS: 1.0000 | ORAL_TABLET | Freq: Four times a day (QID) | ORAL | 0 refills | Status: DC | PRN

## 2024-08-19 MED ORDER — SODIUM CHLORIDE 0.9% FLUSH: 10.0000 mL | INTRAVENOUS | Status: DC | PRN

## 2024-08-19 MED ORDER — SODIUM CHLORIDE 0.9 % IV SOLN
1500.0000 mg | Freq: Once | INTRAVENOUS | Status: AC
  Administered 2024-08-19: 1500 mg via INTRAVENOUS
  Filled 2024-08-19: qty 30

## 2024-08-19 MED ORDER — SODIUM CHLORIDE 0.9 % IV SOLN: INTRAVENOUS | Status: DC

## 2024-08-21 ENCOUNTER — Inpatient Hospital Stay (HOSPITAL_BASED_OUTPATIENT_CLINIC_OR_DEPARTMENT_OTHER): Admission: RE | Admit: 2024-08-21 | Discharge: 2024-08-21 | Attending: Oncology | Admitting: Oncology

## 2024-08-21 ENCOUNTER — Ambulatory Visit (INDEPENDENT_AMBULATORY_CARE_PROVIDER_SITE_OTHER)
Admission: RE | Admit: 2024-08-21 | Discharge: 2024-08-21 | Disposition: A | Discharge status: Home / Self Care | Source: Ambulatory Visit | Attending: Oncology | Admitting: Oncology

## 2024-08-21 ENCOUNTER — Telehealth: Payer: Self-pay

## 2024-08-21 DIAGNOSIS — C3412 Malignant neoplasm of upper lobe, left bronchus or lung: Secondary | ICD-10-CM

## 2024-08-21 MED ORDER — GADOBUTROL 1 MMOL/ML IV SOLN
8.0000 mL | Freq: Once | INTRAVENOUS | Status: AC | PRN
Start: 2024-08-21 — End: 2024-08-21
  Administered 2024-08-21: 8 mL via INTRAVENOUS

## 2024-08-21 NOTE — Telephone Encounter (Addendum)
 MRI of lumbar and sacrum completed. Message sent to Dr Ezzard, Eleanor Harl [facility]ETY and [facility].  Dr Ezzard verbalized seeing test results.

## 2024-09-15 NOTE — Progress Notes (Unsigned)
 [facility] at [facility] [address] [place],  [place]  [address] [phone]  Clinic Day:  08/19/2024  Referring physician: Thurmond Cathlyn LABOR., MD   HISTORY OF PRESENT ILLNESS:  The patient is a 78 y.o. male with limited stage small cell lung cancer.  He comes in today to be evaluated before heading into his 12th cycle of maintenance durvalumab  immunotherapy.  He initially completed definitive chemoradiation in October 2024 for his disease.  His chemotherapy consisted of 4 cycles of cisplatin /etoposide .  Since his last visit, the patient has been doing okay.  However, he recently cracked a few ribs as he landed onto the middle console of his car while reaching for something in the backseat.  However, he is also concerned about developing lower back pain which has come about without any premeditating factor.  With respect to his small cell lung cancer, he denies having any worsening respiratory symptoms which concern him for disease progression.   PHYSICAL EXAM:  There were no vitals taken for this visit. Wt Readings from Last 3 Encounters:  08/19/24 180 lb (81.6 kg)  08/15/24 179 lb 8 oz (81.4 kg)  07/22/24 179 lb 9.6 oz (81.5 kg)   There is no height or weight on file to calculate BMI. Performance status (ECOG): 1 - Symptomatic but completely ambulatory Physical Exam Constitutional:      Appearance: Normal appearance. He is not ill-appearing.  HENT:     Mouth/Throat:     Mouth: Mucous membranes are moist.     Pharynx: Oropharynx is clear. No oropharyngeal exudate or posterior oropharyngeal erythema.  Cardiovascular:     Rate and Rhythm: Normal rate and regular rhythm.     Heart sounds: No murmur heard.    No friction rub. No gallop.  Pulmonary:     Effort: Pulmonary effort is normal. No respiratory distress.     Breath sounds: No wheezing, rhonchi or rales.  Abdominal:     General: Bowel sounds are normal. There is no distension.     Palpations: Abdomen is  soft. There is no mass.     Tenderness: There is no abdominal tenderness.  Musculoskeletal:        General: No swelling.     Right lower leg: No edema.     Left lower leg: No edema.  Lymphadenopathy:     Cervical: No cervical adenopathy.     Upper Body:     Right upper body: No supraclavicular or axillary adenopathy.     Left upper body: No supraclavicular or axillary adenopathy.     Lower Body: No right inguinal adenopathy. No left inguinal adenopathy.  Skin:    General: Skin is warm.     Coloration: Skin is not jaundiced.     Findings: No lesion or rash.  Neurological:     General: No focal deficit present.     Mental Status: He is alert and oriented to person, place, and time. Mental status is at baseline.  Psychiatric:        Mood and Affect: Mood normal.        Behavior: Behavior normal.        Thought Content: Thought content normal.   SCANS:  MRIs of his lumbar and sacral spine on 08-21-24 revealed the following: FINDINGS: Segmentation: 5 lumbar vertebrae. The caudal most well-formed intervertebral disc space is designated L5-S1.   Alignment:  No significant spondylolisthesis or bony retropulsion.   Vertebrae: No significant spondylolisthesis. L1 inferior endplate compression fracture  with 30% vertebral body height loss, similar to the prior CT of 06/05/2024. There is edema and enhancement at this site and this fracture is likely subacute. L4 superior endplate vertebral new from the prior CT (with 40% vertebral body height loss). There is prominent edema and enhancement within the L4 vertebral body (predominantly abutting the superior endplate) and this is consistent with an acute fracture. Ill-defined STIR hyperintense signal abnormality and enhancement also present within the L5, L3, L2 and partially imaged T12 vertebral bodies. Trace degenerative endplate edema and enhancement at L5-S1.   Conus medullaris and cauda equina: Conus extends to the L1-L2 level. No  signal abnormality identified within the visualized distal spinal cord. No pathologic enhancement of the visualized distal spinal cord.   Paraspinal and other soft tissues: No acute finding within included portions of the abdomen/retroperitoneum. No paraspinal mass or collection.   Disc levels:   Moderate disc degeneration at L5-S1. No more than mild disc degeneration at the remaining lumbar levels.   T12-L1: This level is imaged in the sagittal plane only. No significant disc herniation or stenosis.   L1-L2: Small disc bulge. No significant spinal canal or foraminal stenosis.   L2-L3: Small disc bulge. Mild facet and ligamentum flavum hypertrophy. No significant spinal canal or foraminal stenosis.   L3-L4: Central posterior annular fissure. Disc bulge. Facet and ligamentum flavum hypertrophy. Mild relative bilateral subarticular and central canal stenosis. Mild relative bilateral neural foraminal narrowing.   L4-L5: Small disc bulge. Mild facet and ligamentum flavum hypertrophy. No significant spinal canal stenosis. Mild relative bilateral neural foraminal narrowing.   L5-S1: Disc bulge with endplate spurring. Mild facet arthropathy. No significant spinal canal stenosis. Mild bilateral neural foraminal narrowing.   Impressions #1, #2 and #3 will be called to the ordering clinician or representative by the Radiologist Assistant, and communication documented in the PACS or [employer].   IMPRESSION: 1. Subacute L1 inferior endplate compression fracture. 30% vertebral body height loss has not significantly changed from the lumbar spine CT of 06/05/2024. 2. Acute L4 superior endplate vertebral compression fracture (with 40% vertebral body height loss), new from the prior CT. 3. Nonspecific patchy signal abnormality and enhancement within the L5, L3, L2 and partially imaged T12 vertebral bodies. The appearance is not typical of osseous metastatic disease. However given  the patient's history, a short-interval follow-up MRI (with and without contrast) is recommended in 2 months for surveillance. Additionally, consider a nuclear medicine bone scan for further evaluation. The follow-up MRI can also ensure expected evolution of the L1 and L4 compression fractures described above. 4. Background lumbar spondylosis as outlined within the body of the report. No more than mild subarticular, central canal or neural foraminal stenosis. 5. Same day sacral spine MRI separately reported. ------------------------------------------------------------------------ FINDINGS:   URINARY TRACT: No abnormality visualized.   BOWEL: Unremarkable visualized pelvic bowel loops.   VASCULAR AND LYMPHATIC: No pathologically enlarged lymph nodes. No significant vascular abnormality seen.   REPRODUCTIVE: No mass or other significant abnormality   OTHER:   Musculoskeletal: Superior endplate compression fracture at L4. This will be described in greater detail on the dedicated lumbar spine MRI report. Mild degenerative findings along the sacral side of the right sacroiliac joint including low level marrow edema along the lateral margin of the right sacral ala on image 22 series 6. No SI joint effusion. Small focus of accentuated T2 signal superiorly in the L5 vertebral body is nonspecific. No suspicious bone lesions identified.   IMPRESSION: 1. Acute or subacute  superior endplate compression fracture at L4. 2. Mild degenerative findings at the right sacroiliac joint. LABS:      Latest Ref Rng & Units 08/19/2024    9:24 AM 07/22/2024    9:10 AM 06/23/2024    9:33 AM  CBC  WBC 4.0 - 10.5 K/uL 7.3  6.7  6.9   Hemoglobin 13.0 - 17.0 g/dL 88.0  87.2  87.5   Hematocrit 39.0 - 52.0 % 37.6  39.4  39.2   Platelets 150 - 400 K/uL 181  192  207       Latest Ref Rng & Units 08/19/2024    9:24 AM 07/22/2024    9:10 AM 06/23/2024    9:33 AM  CMP  Glucose 70 - 99 mg/dL 896  63  899    BUN 8 - 23 mg/dL 17  12  20    Creatinine 0.61 - 1.24 mg/dL 8.94  9.00  9.04   Sodium 135 - 145 mmol/L 139  140  139   Potassium 3.5 - 5.1 mmol/L 3.9  4.0  3.9   Chloride 98 - 111 mmol/L 102  103  101   CO2 22 - 32 mmol/L 26  27  27    Calcium 8.9 - 10.3 mg/dL 9.8  89.9  9.6   Total Protein 6.5 - 8.1 g/dL 7.0  7.4  7.3   Total Bilirubin 0.0 - 1.2 mg/dL 0.3  0.3  0.5   Alkaline Phos 38 - 126 U/L 141  145  161   AST 15 - 41 U/L 23  14  18    ALT 0 - 44 U/L 8  <5  5    ASSESSMENT & PLAN:  A 78 y.o. male with limited stage small cell lung cancer.  He will proceed with his 11th cycle of maintenance durvalumab  immunotherapy today.  With respect to his back pain, as small cell lung cancer has a predilection to spread to the bones, an MRI of his lumbosacral spine will be done within the next few days to rule out bone metastasis.  I will prescribe this gentleman hydrocodone/Tylenol 10/325 today, which he knows to take as needed for breakthrough pain.  Otherwise, I will see him back in 4 weeks before he heads into his 12th cycle of maintenance durvalumab  immunotherapy.  The patient and his family understand all the plans discussed today and are in agreement with them.   Tynia Wiers DELENA Kerns, MD

## 2024-09-16 ENCOUNTER — Inpatient Hospital Stay

## 2024-09-16 ENCOUNTER — Inpatient Hospital Stay: Admitting: Oncology

## 2024-09-16 ENCOUNTER — Other Ambulatory Visit: Payer: Self-pay | Admitting: Oncology

## 2024-09-16 ENCOUNTER — Telehealth: Payer: Self-pay | Admitting: Oncology

## 2024-09-16 ENCOUNTER — Inpatient Hospital Stay: Attending: Oncology

## 2024-09-16 VITALS — BP 122/59 | HR 79 | Temp 98.2°F | Resp 16 | Ht 66.0 in | Wt 179.5 lb

## 2024-09-16 DIAGNOSIS — C3412 Malignant neoplasm of upper lobe, left bronchus or lung: Secondary | ICD-10-CM

## 2024-09-16 DIAGNOSIS — M4856XA Collapsed vertebra, not elsewhere classified, lumbar region, initial encounter for fracture: Secondary | ICD-10-CM | POA: Diagnosis not present

## 2024-09-16 DIAGNOSIS — Z79899 Other long term (current) drug therapy: Secondary | ICD-10-CM | POA: Diagnosis not present

## 2024-09-16 DIAGNOSIS — M533 Sacrococcygeal disorders, not elsewhere classified: Secondary | ICD-10-CM | POA: Insufficient documentation

## 2024-09-16 DIAGNOSIS — Z5112 Encounter for antineoplastic immunotherapy: Secondary | ICD-10-CM | POA: Insufficient documentation

## 2024-09-16 DIAGNOSIS — M47816 Spondylosis without myelopathy or radiculopathy, lumbar region: Secondary | ICD-10-CM | POA: Diagnosis not present

## 2024-09-16 DIAGNOSIS — M51379 Other intervertebral disc degeneration, lumbosacral region without mention of lumbar back pain or lower extremity pain: Secondary | ICD-10-CM | POA: Diagnosis not present

## 2024-09-16 LAB — CBC WITH DIFFERENTIAL (CANCER CENTER ONLY)
Abs Immature Granulocytes: 0.02 K/uL (ref 0.00–0.07)
Basophils Absolute: 0 K/uL (ref 0.0–0.1)
Basophils Relative: 0 %
Eosinophils Absolute: 0.1 K/uL (ref 0.0–0.5)
Eosinophils Relative: 2 %
HCT: 38.5 % — ABNORMAL LOW (ref 39.0–52.0)
Hemoglobin: 12.5 g/dL — ABNORMAL LOW (ref 13.0–17.0)
Immature Granulocytes: 0 %
Lymphocytes Relative: 25 %
Lymphs Abs: 1.7 K/uL (ref 0.7–4.0)
MCH: 29.1 pg (ref 26.0–34.0)
MCHC: 32.5 g/dL (ref 30.0–36.0)
MCV: 89.7 fL (ref 80.0–100.0)
Monocytes Absolute: 0.7 K/uL (ref 0.1–1.0)
Monocytes Relative: 11 %
Neutro Abs: 4.3 K/uL (ref 1.7–7.7)
Neutrophils Relative %: 62 %
Platelet Count: 193 K/uL (ref 150–400)
RBC: 4.29 MIL/uL (ref 4.22–5.81)
RDW: 16 % — ABNORMAL HIGH (ref 11.5–15.5)
WBC Count: 6.8 K/uL (ref 4.0–10.5)
nRBC: 0 % (ref 0.0–0.2)

## 2024-09-16 LAB — CMP (CANCER CENTER ONLY)
ALT: 7 U/L (ref 0–44)
AST: 20 U/L (ref 15–41)
Albumin: 4.2 g/dL (ref 3.5–5.0)
Alkaline Phosphatase: 145 U/L — ABNORMAL HIGH (ref 38–126)
Anion gap: 7 (ref 5–15)
BUN: 16 mg/dL (ref 8–23)
CO2: 30 mmol/L (ref 22–32)
Calcium: 9.9 mg/dL (ref 8.9–10.3)
Chloride: 104 mmol/L (ref 98–111)
Creatinine: 1 mg/dL (ref 0.61–1.24)
GFR, Estimated: >60 mL/min (ref >60)
Glucose, Bld: 67 mg/dL — ABNORMAL LOW (ref 70–99)
Potassium: 4.1 mmol/L (ref 3.5–5.1)
Sodium: 141 mmol/L (ref 135–145)
Total Bilirubin: 0.4 mg/dL (ref 0.0–1.2)
Total Protein: 7 g/dL (ref 6.5–8.1)

## 2024-09-16 LAB — TSH: TSH: 2.08 u[IU]/mL (ref 0.350–4.500)

## 2024-09-16 MED ORDER — SODIUM CHLORIDE 0.9 % IV SOLN: INTRAVENOUS | Status: DC

## 2024-09-16 MED ORDER — SODIUM CHLORIDE 0.9 % IV SOLN
1500.0000 mg | Freq: Once | INTRAVENOUS | Status: AC
  Administered 2024-09-16: 1500 mg via INTRAVENOUS
  Filled 2024-09-16: qty 30

## 2024-09-16 NOTE — Patient Instructions (Signed)
 Durvalumab Injection What is this medication? DURVALUMAB (dur VAL ue mab) treats some types of cancer. It works by helping your immune system slow or stop the spread of cancer cells. It is a monoclonal antibody. This medicine may be used for other purposes; ask your health care provider or pharmacist if you have questions. COMMON BRAND NAME(S): IMFINZI What should I tell my care team before I take this medication? They need to know if you have any of these conditions: Allogeneic stem cell transplant (uses someone else's stem cells) Autoimmune diseases, such as Crohn disease, ulcerative colitis, lupus History of chest radiation Nervous system problems, such as Guillain-Barre syndrome, myasthenia gravis Organ transplant An unusual or allergic reaction to durvalumab, other medications, foods, dyes, or preservatives Pregnant or trying to get pregnant Breast-feeding How should I use this medication? This medication is infused into a vein. It is given by your care team in a hospital or clinic setting. A special MedGuide will be given to you before each treatment. Be sure to read this information carefully each time. Talk to your care team about the use of this medication in children. Special care may be needed. Overdosage: If you think you have taken too much of this medicine contact a poison control center or emergency room at once. NOTE: This medicine is only for you. Do not share this medicine with others. What if I miss a dose? Keep appointments for follow-up doses. It is important not to miss your dose. Call your care team if you are unable to keep an appointment. What may interact with this medication? Interactions have not been studied. This list may not describe all possible interactions. Give your health care provider a list of all the medicines, herbs, non-prescription drugs, or dietary supplements you use. Also tell them if you smoke, drink alcohol, or use illegal drugs. Some items may  interact with your medicine. What should I watch for while using this medication? Your condition will be monitored carefully while you are receiving this medication. You may need blood work while taking this medication. This medication may cause serious skin reactions. They can happen weeks to months after starting the medication. Contact your care team right away if you notice fevers or flu-like symptoms with a rash. The rash may be red or purple and then turn into blisters or peeling of the skin. You may also notice a red rash with swelling of the face, lips, or lymph nodes in your neck or under your arms. Tell your care team right away if you have any change in your eyesight. Talk to your care team if you may be pregnant. Serious birth defects can occur if you take this medication during pregnancy and for 3 months after the last dose. You will need a negative pregnancy test before starting this medication. Contraception is recommended while taking this medication and for 3 months after the last dose. Your care team can help you find the option that works for you. Do not breastfeed while taking this medication and for 3 months after the last dose. What side effects may I notice from receiving this medication? Side effects that you should report to your care team as soon as possible: Allergic reactions--skin rash, itching, hives, swelling of the face, lips, tongue, or throat Dry cough, shortness of breath or trouble breathing Eye pain, redness, irritation, or discharge with blurry or decreased vision Heart muscle inflammation--unusual weakness or fatigue, shortness of breath, chest pain, fast or irregular heartbeat, dizziness, swelling of the  ankles, feet, or hands Hormone gland problems--headache, sensitivity to light, unusual weakness or fatigue, dizziness, fast or irregular heartbeat, increased sensitivity to cold or heat, excessive sweating, constipation, hair loss, increased thirst or amount of  urine, tremors or shaking, irritability Infusion reactions--chest pain, shortness of breath or trouble breathing, feeling faint or lightheaded Kidney injury (glomerulonephritis)--decrease in the amount of urine, red or dark brown urine, foamy or bubbly urine, swelling of the ankles, hands, or feet Liver injury--right upper belly pain, loss of appetite, nausea, light-colored stool, dark yellow or brown urine, yellowing skin or eyes, unusual weakness or fatigue Pain, tingling, or numbness in the hands or feet, muscle weakness, change in vision, confusion or trouble speaking, loss of balance or coordination, trouble walking, seizures Rash, fever, and swollen lymph nodes Redness, blistering, peeling, or loosening of the skin, including inside the mouth Sudden or severe stomach pain, bloody diarrhea, fever, nausea, vomiting Side effects that usually do not require medical attention (report these to your care team if they continue or are bothersome): Bone, joint, or muscle pain Diarrhea Fatigue Loss of appetite Nausea Skin rash This list may not describe all possible side effects. Call your doctor for medical advice about side effects. You may report side effects to FDA at 1-800-FDA-1088. Where should I keep my medication? This medication is given in a hospital or clinic. It will not be stored at home. NOTE: This sheet is a summary. It may not cover all possible information. If you have questions about this medicine, talk to your doctor, pharmacist, or health care provider.  2024 Elsevier/Gold Standard (2022-03-21 00:00:00)

## 2024-09-16 NOTE — Telephone Encounter (Signed)
 Patient has been scheduled for follow-up visit per 09/16/24 LOS.  Pt given an appt calendar with date and time.

## 2024-09-17 ENCOUNTER — Other Ambulatory Visit: Payer: Self-pay

## 2024-09-17 LAB — T4: T4, Total: 9.3 ug/dL (ref 4.5–12.0)

## 2024-09-29 ENCOUNTER — Telehealth: Payer: Self-pay

## 2024-09-29 NOTE — Telephone Encounter (Addendum)
 Pt's labs drawn on 09/16/2024 in office with Dr Ezzard have been reviewed. Those labs are more recent than the labs done @ PCP on 09/03/2024. Additional lab results from 09/03/2024 @ [facility]- (not drawn here) are Iron 54, transferrin  228  TIBC  326 Sat% 17  Ferritin 150  B12 greater than 1500, A1c 6.6 - will notify Dr. Ezzard of those.  Dr Ezzard aware of all labs, no new concerns/orders.

## 2024-10-07 ENCOUNTER — Other Ambulatory Visit: Payer: Self-pay

## 2024-10-09 ENCOUNTER — Inpatient Hospital Stay (HOSPITAL_BASED_OUTPATIENT_CLINIC_OR_DEPARTMENT_OTHER)
Admission: RE | Admit: 2024-10-09 | Discharge: 2024-10-09 | Disposition: A | Source: Ambulatory Visit | Attending: Oncology | Admitting: Oncology

## 2024-10-09 DIAGNOSIS — C3412 Malignant neoplasm of upper lobe, left bronchus or lung: Secondary | ICD-10-CM

## 2024-10-09 MED ORDER — IOHEXOL 300 MG/ML  SOLN
100.0000 mL | Freq: Once | INTRAMUSCULAR | Status: AC | PRN
  Administered 2024-10-09: 60 mL via INTRAVENOUS

## 2024-10-12 ENCOUNTER — Other Ambulatory Visit: Payer: Self-pay | Admitting: Oncology

## 2024-10-12 DIAGNOSIS — C3412 Malignant neoplasm of upper lobe, left bronchus or lung: Secondary | ICD-10-CM

## 2024-10-13 ENCOUNTER — Other Ambulatory Visit: Payer: Self-pay

## 2024-10-13 NOTE — Progress Notes (Unsigned)
 [facility] at [facility] [address] [place],  [place]  [address] [phone]  Clinic Day:  09/16/2024  Referring physician: Thurmond Cathlyn LABOR., MD   HISTORY OF PRESENT ILLNESS:  The patient is a 78 y.o. male with limited stage small cell lung cancer.  He comes in today to go over this chest CT after receiving 12 cycles of maintenance durvalumab  immunotherapy.  He initially completed definitive chemoradiation in October 2024 for his disease management.  His chemotherapy consisted of 4 cycles of cisplatin /etoposide .  Since his last visit, the patient has been doing better.  The previous back pain he complained of essentially no longer exists.  Of note, an MRI was recently done of his lumbosacral spine, which fortunately showed no evidence of metastatic disease.  However, degenerative changes and compression fractures were clearly appreciated.  PHYSICAL EXAM:  There were no vitals taken for this visit. Wt Readings from Last 3 Encounters:  09/16/24 179 lb 8 oz (81.4 kg)  08/19/24 180 lb (81.6 kg)  08/15/24 179 lb 8 oz (81.4 kg)   There is no height or weight on file to calculate BMI. Performance status (ECOG): 1 - Symptomatic but completely ambulatory Physical Exam Constitutional:      Appearance: Normal appearance. He is not ill-appearing.  HENT:     Mouth/Throat:     Mouth: Mucous membranes are moist.     Pharynx: Oropharynx is clear. No oropharyngeal exudate or posterior oropharyngeal erythema.  Cardiovascular:     Rate and Rhythm: Normal rate and regular rhythm.     Heart sounds: No murmur heard.    No friction rub. No gallop.  Pulmonary:     Effort: Pulmonary effort is normal. No respiratory distress.     Breath sounds: No wheezing, rhonchi or rales.  Abdominal:     General: Bowel sounds are normal. There is no distension.     Palpations: Abdomen is soft. There is no mass.     Tenderness: There is no abdominal tenderness.  Musculoskeletal:         General: No swelling.     Right lower leg: No edema.     Left lower leg: No edema.  Lymphadenopathy:     Cervical: No cervical adenopathy.     Upper Body:     Right upper body: No supraclavicular or axillary adenopathy.     Left upper body: No supraclavicular or axillary adenopathy.     Lower Body: No right inguinal adenopathy. No left inguinal adenopathy.  Skin:    General: Skin is warm.     Coloration: Skin is not jaundiced.     Findings: No lesion or rash.  Neurological:     General: No focal deficit present.     Mental Status: He is alert and oriented to person, place, and time. Mental status is at baseline.  Psychiatric:        Mood and Affect: Mood normal.        Behavior: Behavior normal.        Thought Content: Thought content normal.   SCANS:  His chest CT on 10-09-24 revealed the following: FINDINGS:   MEDIASTINUM: Heart and pericardium are unremarkable. The central airways are clear. Thoracic aortic, coronary artery, and branch vessel atheromatous vascular calcifications.   LYMPH NODES: No current pathologic adenopathy in the chest.   LUNGS AND PLEURA: Bandlike consolidation in the left upper lobe appears unchanged and compatible with prior radiation therapy. This has a mixed appearance with slightly more  confluence anteriorly (image 28 series 302) but with reduced conspicuity of central nodularity in the vicinity of image 33 series 302. Overall no compelling findings of progressive or active malignancy. Emphysema. Airway thickening suggest bronchitis or reactive airways disease. Airway plugging in the left lower lobe. No pleural effusion or pneumothorax.   SOFT TISSUES/BONES: Right greater than left degenerative glenohumeral arthropathy. Stable old posterior rib deformities. Interval 30% compression fracture at T5 with superior and inferior endplate involvement. Remote inferior endplate compression fracture at T6 and remote superior endplate compression  fracture at T8. Interval 33 percent inferior endplate compression fracture at L1 compared to 02/03/2024, as shown on lumbar MRI of 08/21/2024. No acute abnormality of the soft tissues.   UPPER ABDOMEN: Limited images of the upper abdomen demonstrates no acute abnormality.   IMPRESSION: 1. No findings of active malignancy. 2. Interval subacute 30 percent compression fracture at T5. Also subacute inferior endplate compression fracture at L1 (the latter was shown on lumbar MRI from 08/21/2024.) . 3. Bandlike consolidation in the left upper lobe compatible with prior radiation therapy. 4. Airway thickening suggestive of bronchitis or reactive airways disease with airway plugging in the left lower lobe. 5. Emphysema. 6. Thoracic aortic, coronary artery, and branch vessel atheromatous vascular calcifications.     MRIs of his lumbar and sacral spine on 08-21-24 revealed the following: FINDINGS: Segmentation: 5 lumbar vertebrae. The caudal most well-formed intervertebral disc space is designated L5-S1.   Alignment:  No significant spondylolisthesis or bony retropulsion.   Vertebrae: No significant spondylolisthesis. L1 inferior endplate compression fracture with 30% vertebral body height loss, similar to the prior CT of 06/05/2024. There is edema and enhancement at this site and this fracture is likely subacute. L4 superior endplate vertebral new from the prior CT (with 40% vertebral body height loss). There is prominent edema and enhancement within the L4 vertebral body (predominantly abutting the superior endplate) and this is consistent with an acute fracture. Ill-defined STIR hyperintense signal abnormality and enhancement also present within the L5, L3, L2 and partially imaged T12 vertebral bodies. Trace degenerative endplate edema and enhancement at L5-S1.   Conus medullaris and cauda equina: Conus extends to the L1-L2 level. No signal abnormality identified within the  visualized distal spinal cord. No pathologic enhancement of the visualized distal spinal cord.   Paraspinal and other soft tissues: No acute finding within included portions of the abdomen/retroperitoneum. No paraspinal mass or collection.   Disc levels:   Moderate disc degeneration at L5-S1. No more than mild disc degeneration at the remaining lumbar levels.   T12-L1: This level is imaged in the sagittal plane only. No significant disc herniation or stenosis.   L1-L2: Small disc bulge. No significant spinal canal or foraminal stenosis.   L2-L3: Small disc bulge. Mild facet and ligamentum flavum hypertrophy. No significant spinal canal or foraminal stenosis.   L3-L4: Central posterior annular fissure. Disc bulge. Facet and ligamentum flavum hypertrophy. Mild relative bilateral subarticular and central canal stenosis. Mild relative bilateral neural foraminal narrowing.   L4-L5: Small disc bulge. Mild facet and ligamentum flavum hypertrophy. No significant spinal canal stenosis. Mild relative bilateral neural foraminal narrowing.   L5-S1: Disc bulge with endplate spurring. Mild facet arthropathy. No significant spinal canal stenosis. Mild bilateral neural foraminal narrowing.   Impressions #1, #2 and #3 will be called to the ordering clinician or representative by the Radiologist Assistant, and communication documented in the PACS or [employer].   IMPRESSION: 1. Subacute L1 inferior endplate compression fracture. 30% vertebral  body height loss has not significantly changed from the lumbar spine CT of 06/05/2024. 2. Acute L4 superior endplate vertebral compression fracture (with 40% vertebral body height loss), new from the prior CT. 3. Nonspecific patchy signal abnormality and enhancement within the L5, L3, L2 and partially imaged T12 vertebral bodies. The appearance is not typical of osseous metastatic disease. However given the patient's history, a short-interval  follow-up MRI (with and without contrast) is recommended in 2 months for surveillance. Additionally, consider a nuclear medicine bone scan for further evaluation. The follow-up MRI can also ensure expected evolution of the L1 and L4 compression fractures described above. 4. Background lumbar spondylosis as outlined within the body of the report. No more than mild subarticular, central canal or neural foraminal stenosis. 5. Same day sacral spine MRI separately reported. ------------------------------------------------------------------------ FINDINGS:   URINARY TRACT: No abnormality visualized.   BOWEL: Unremarkable visualized pelvic bowel loops.   VASCULAR AND LYMPHATIC: No pathologically enlarged lymph nodes. No significant vascular abnormality seen.   REPRODUCTIVE: No mass or other significant abnormality   OTHER:   Musculoskeletal: Superior endplate compression fracture at L4. This will be described in greater detail on the dedicated lumbar spine MRI report. Mild degenerative findings along the sacral side of the right sacroiliac joint including low level marrow edema along the lateral margin of the right sacral ala on image 22 series 6. No SI joint effusion. Small focus of accentuated T2 signal superiorly in the L5 vertebral body is nonspecific. No suspicious bone lesions identified.   IMPRESSION: 1. Acute or subacute superior endplate compression fracture at L4. 2. Mild degenerative findings at the right sacroiliac joint.  LABS:      Latest Ref Rng & Units 09/16/2024    9:04 AM 08/19/2024    9:24 AM 07/22/2024    9:10 AM  CBC  WBC 4.0 - 10.5 K/uL 6.8  7.3  6.7   Hemoglobin 13.0 - 17.0 g/dL 87.4  88.0  87.2   Hematocrit 39.0 - 52.0 % 38.5  37.6  39.4   Platelets 150 - 400 K/uL 193  181  192       Latest Ref Rng & Units 09/16/2024    9:04 AM 08/19/2024    9:24 AM 07/22/2024    9:10 AM  CMP  Glucose 70 - 99 mg/dL 67  896  63   BUN 8 - 23 mg/dL 16  17  12     Creatinine 0.61 - 1.24 mg/dL 8.99  8.94  9.00   Sodium 135 - 145 mmol/L 141  139  140   Potassium 3.5 - 5.1 mmol/L 4.1  3.9  4.0   Chloride 98 - 111 mmol/L 104  102  103   CO2 22 - 32 mmol/L 30  26  27    Calcium 8.9 - 10.3 mg/dL 9.9  9.8  89.9   Total Protein 6.5 - 8.1 g/dL 7.0  7.0  7.4   Total Bilirubin 0.0 - 1.2 mg/dL 0.4  0.3  0.3   Alkaline Phos 38 - 126 U/L 145  141  145   AST 15 - 41 U/L 20  23  14    ALT 0 - 44 U/L 7  8  <5    ASSESSMENT & PLAN:  A 78 y.o. male with limited stage small cell lung cancer. Review of CT imaging revealed   IMPRESSION: 1. No findings of active malignancy. 2. Interval subacute 30 percent compression fracture at T5. Also subacute inferior endplate compression fracture at L1 (  the latter was shown on lumbar MRI from 08/21/2024.) . 3. Bandlike consolidation in the left upper lobe compatible with prior radiation therapy. 4. Airway thickening suggestive of bronchitis or reactive airways disease with airway plugging in the left lower lobe. 5. Emphysema. 6. Thoracic aortic, coronary artery   He will proceed with his 13th cycle of maintenance durvalumab  immunotherapy today.  Clinically, the patient appears to be doing much better as his previous back pain has essentially resolved.  I will see him back in 4 weeks before he heads into his 14th cycle of maintenance durvalumab  immunotherapy.    The patient understands all the plans discussed today and is in agreement with them.  Eleanor Bach, FNP- BC

## 2024-10-14 ENCOUNTER — Inpatient Hospital Stay

## 2024-10-14 ENCOUNTER — Inpatient Hospital Stay: Attending: Oncology

## 2024-10-14 ENCOUNTER — Inpatient Hospital Stay: Admitting: Hematology and Oncology

## 2024-10-14 ENCOUNTER — Other Ambulatory Visit: Payer: Self-pay

## 2024-10-14 ENCOUNTER — Telehealth: Payer: Self-pay | Admitting: Hematology and Oncology

## 2024-10-14 ENCOUNTER — Encounter: Payer: Self-pay | Admitting: Oncology

## 2024-10-14 VITALS — BP 131/64 | HR 64 | Temp 97.7°F | Resp 20 | Ht 66.0 in | Wt 181.5 lb

## 2024-10-14 DIAGNOSIS — M533 Sacrococcygeal disorders, not elsewhere classified: Secondary | ICD-10-CM | POA: Diagnosis not present

## 2024-10-14 DIAGNOSIS — I700 Atherosclerosis of aorta: Secondary | ICD-10-CM | POA: Diagnosis not present

## 2024-10-14 DIAGNOSIS — C3412 Malignant neoplasm of upper lobe, left bronchus or lung: Secondary | ICD-10-CM

## 2024-10-14 DIAGNOSIS — I2510 Atherosclerotic heart disease of native coronary artery without angina pectoris: Secondary | ICD-10-CM | POA: Diagnosis not present

## 2024-10-14 DIAGNOSIS — Z5112 Encounter for antineoplastic immunotherapy: Secondary | ICD-10-CM | POA: Insufficient documentation

## 2024-10-14 DIAGNOSIS — C7951 Secondary malignant neoplasm of bone: Secondary | ICD-10-CM | POA: Diagnosis not present

## 2024-10-14 DIAGNOSIS — Z9221 Personal history of antineoplastic chemotherapy: Secondary | ICD-10-CM | POA: Diagnosis not present

## 2024-10-14 DIAGNOSIS — J439 Emphysema, unspecified: Secondary | ICD-10-CM | POA: Insufficient documentation

## 2024-10-14 DIAGNOSIS — Z79899 Other long term (current) drug therapy: Secondary | ICD-10-CM | POA: Diagnosis not present

## 2024-10-14 DIAGNOSIS — Z923 Personal history of irradiation: Secondary | ICD-10-CM | POA: Diagnosis not present

## 2024-10-14 LAB — CBC WITH DIFFERENTIAL (CANCER CENTER ONLY)
Abs Immature Granulocytes: 0.01 K/uL (ref 0.00–0.07)
Basophils Absolute: 0.1 K/uL (ref 0.0–0.1)
Basophils Relative: 1 %
Eosinophils Absolute: 0.2 K/uL (ref 0.0–0.5)
Eosinophils Relative: 3 %
HCT: 37.1 % — ABNORMAL LOW (ref 39.0–52.0)
Hemoglobin: 12 g/dL — ABNORMAL LOW (ref 13.0–17.0)
Immature Granulocytes: 0 %
Lymphocytes Relative: 27 %
Lymphs Abs: 1.6 K/uL (ref 0.7–4.0)
MCH: 29.3 pg (ref 26.0–34.0)
MCHC: 32.3 g/dL (ref 30.0–36.0)
MCV: 90.5 fL (ref 80.0–100.0)
Monocytes Absolute: 0.6 K/uL (ref 0.1–1.0)
Monocytes Relative: 10 %
Neutro Abs: 3.6 K/uL (ref 1.7–7.7)
Neutrophils Relative %: 59 %
Platelet Count: 184 K/uL (ref 150–400)
RBC: 4.1 MIL/uL — ABNORMAL LOW (ref 4.22–5.81)
RDW: 15.5 % (ref 11.5–15.5)
WBC Count: 6.1 K/uL (ref 4.0–10.5)
nRBC: 0 % (ref 0.0–0.2)

## 2024-10-14 LAB — CMP (CANCER CENTER ONLY)
ALT: 8 U/L (ref 0–44)
AST: 22 U/L (ref 15–41)
Albumin: 3.9 g/dL (ref 3.5–5.0)
Alkaline Phosphatase: 131 U/L — ABNORMAL HIGH (ref 38–126)
Anion gap: 11 (ref 5–15)
BUN: 14 mg/dL (ref 8–23)
CO2: 28 mmol/L (ref 22–32)
Calcium: 9.6 mg/dL (ref 8.9–10.3)
Chloride: 103 mmol/L (ref 98–111)
Creatinine: 0.87 mg/dL (ref 0.61–1.24)
GFR, Estimated: >60 mL/min (ref >60)
Glucose, Bld: 105 mg/dL — ABNORMAL HIGH (ref 70–99)
Potassium: 3.6 mmol/L (ref 3.5–5.1)
Sodium: 142 mmol/L (ref 135–145)
Total Bilirubin: 0.3 mg/dL (ref 0.0–1.2)
Total Protein: 6.8 g/dL (ref 6.5–8.1)

## 2024-10-14 LAB — TSH: TSH: 5.03 u[IU]/mL — ABNORMAL HIGH (ref 0.350–4.500)

## 2024-10-14 MED ORDER — SODIUM CHLORIDE 0.9 % IV SOLN: INTRAVENOUS | Status: DC

## 2024-10-14 MED ORDER — SODIUM CHLORIDE 0.9 % IV SOLN
1500.0000 mg | Freq: Once | INTRAVENOUS | Status: AC
  Administered 2024-10-14: 1500 mg via INTRAVENOUS
  Filled 2024-10-14: qty 30

## 2024-10-14 NOTE — Telephone Encounter (Signed)
 Patient has been scheduled for follow-up visit per 10/14/2024 LOS.  Pt given an appt calendar with date and time.

## 2024-10-14 NOTE — Patient Instructions (Signed)
 Durvalumab Injection What is this medication? DURVALUMAB (dur VAL ue mab) treats some types of cancer. It works by helping your immune system slow or stop the spread of cancer cells. It is a monoclonal antibody. This medicine may be used for other purposes; ask your health care provider or pharmacist if you have questions. COMMON BRAND NAME(S): IMFINZI What should I tell my care team before I take this medication? They need to know if you have any of these conditions: Allogeneic stem cell transplant (uses someone else's stem cells) Autoimmune diseases, such as Crohn disease, ulcerative colitis, lupus History of chest radiation Nervous system problems, such as Guillain-Barre syndrome, myasthenia gravis Organ transplant An unusual or allergic reaction to durvalumab, other medications, foods, dyes, or preservatives Pregnant or trying to get pregnant Breast-feeding How should I use this medication? This medication is infused into a vein. It is given by your care team in a hospital or clinic setting. A special MedGuide will be given to you before each treatment. Be sure to read this information carefully each time. Talk to your care team about the use of this medication in children. Special care may be needed. Overdosage: If you think you have taken too much of this medicine contact a poison control center or emergency room at once. NOTE: This medicine is only for you. Do not share this medicine with others. What if I miss a dose? Keep appointments for follow-up doses. It is important not to miss your dose. Call your care team if you are unable to keep an appointment. What may interact with this medication? Interactions have not been studied. This list may not describe all possible interactions. Give your health care provider a list of all the medicines, herbs, non-prescription drugs, or dietary supplements you use. Also tell them if you smoke, drink alcohol, or use illegal drugs. Some items may  interact with your medicine. What should I watch for while using this medication? Your condition will be monitored carefully while you are receiving this medication. You may need blood work while taking this medication. This medication may cause serious skin reactions. They can happen weeks to months after starting the medication. Contact your care team right away if you notice fevers or flu-like symptoms with a rash. The rash may be red or purple and then turn into blisters or peeling of the skin. You may also notice a red rash with swelling of the face, lips, or lymph nodes in your neck or under your arms. Tell your care team right away if you have any change in your eyesight. Talk to your care team if you may be pregnant. Serious birth defects can occur if you take this medication during pregnancy and for 3 months after the last dose. You will need a negative pregnancy test before starting this medication. Contraception is recommended while taking this medication and for 3 months after the last dose. Your care team can help you find the option that works for you. Do not breastfeed while taking this medication and for 3 months after the last dose. What side effects may I notice from receiving this medication? Side effects that you should report to your care team as soon as possible: Allergic reactions--skin rash, itching, hives, swelling of the face, lips, tongue, or throat Dry cough, shortness of breath or trouble breathing Eye pain, redness, irritation, or discharge with blurry or decreased vision Heart muscle inflammation--unusual weakness or fatigue, shortness of breath, chest pain, fast or irregular heartbeat, dizziness, swelling of the  ankles, feet, or hands Hormone gland problems--headache, sensitivity to light, unusual weakness or fatigue, dizziness, fast or irregular heartbeat, increased sensitivity to cold or heat, excessive sweating, constipation, hair loss, increased thirst or amount of  urine, tremors or shaking, irritability Infusion reactions--chest pain, shortness of breath or trouble breathing, feeling faint or lightheaded Kidney injury (glomerulonephritis)--decrease in the amount of urine, red or dark brown urine, foamy or bubbly urine, swelling of the ankles, hands, or feet Liver injury--right upper belly pain, loss of appetite, nausea, light-colored stool, dark yellow or brown urine, yellowing skin or eyes, unusual weakness or fatigue Pain, tingling, or numbness in the hands or feet, muscle weakness, change in vision, confusion or trouble speaking, loss of balance or coordination, trouble walking, seizures Rash, fever, and swollen lymph nodes Redness, blistering, peeling, or loosening of the skin, including inside the mouth Sudden or severe stomach pain, bloody diarrhea, fever, nausea, vomiting Side effects that usually do not require medical attention (report these to your care team if they continue or are bothersome): Bone, joint, or muscle pain Diarrhea Fatigue Loss of appetite Nausea Skin rash This list may not describe all possible side effects. Call your doctor for medical advice about side effects. You may report side effects to FDA at 1-800-FDA-1088. Where should I keep my medication? This medication is given in a hospital or clinic. It will not be stored at home. NOTE: This sheet is a summary. It may not cover all possible information. If you have questions about this medicine, talk to your doctor, pharmacist, or health care provider.  2024 Elsevier/Gold Standard (2022-03-21 00:00:00)

## 2024-10-15 ENCOUNTER — Other Ambulatory Visit: Payer: Self-pay

## 2024-10-15 LAB — T4: T4, Total: 6.8 ug/dL (ref 4.5–12.0)

## 2024-10-23 ENCOUNTER — Other Ambulatory Visit: Payer: Self-pay

## 2024-10-24 ENCOUNTER — Other Ambulatory Visit: Payer: Self-pay

## 2024-10-24 ENCOUNTER — Telehealth: Payer: Self-pay | Admitting: Pharmacy Technician

## 2024-10-24 NOTE — Telephone Encounter (Signed)
 Patient is receiving foundation assistance  Medication: [employer]  Program: [employer] SCLC Grant  Approval Dates: Approved from 07/25/2024 until 10/22/2025 ID: [id number] Fund Amount: $ [id number]

## 2024-11-10 NOTE — Progress Notes (Unsigned)
 "  [facility] [address] [place],  [place]  [address] [phone]  Clinic Day:  11/11/2024  Referring physician: Thurmond Cathlyn LABOR., MD   HISTORY OF PRESENT ILLNESS:  The patient is a 78 y.o. male with limited stage small cell lung cancer.  He comes in today to be evaluated before heading into his 14th cycle of maintenance durvalumab  immunotherapy.  He initially completed definitive chemoradiation in October 2024 for his disease management.  His chemotherapy consisted of 4 cycles of cisplatin /etoposide .  Since his last visit, the patient has been doing well.  He denies having any new symptoms or findings which concern him for disease recurrence.  Of note, recent CT scans in November 2025 showed no evidence of disease recurrence.  PHYSICAL EXAM:  Blood pressure 108/62, pulse 77, temperature 98.3 F (36.8 C), temperature source Oral, resp. rate 18, height 5' 6 (1.676 m), weight 177 lb 9.6 oz (80.6 kg), SpO2 94%. Wt Readings from Last 3 Encounters:  11/11/24 177 lb 9.6 oz (80.6 kg)  10/14/24 181 lb 8 oz (82.3 kg)  09/16/24 179 lb 8 oz (81.4 kg)   Body mass index is 28.67 kg/m. Performance status (ECOG): 1 - Symptomatic but completely ambulatory Physical Exam Constitutional:      Appearance: Normal appearance. He is not ill-appearing.  HENT:     Mouth/Throat:     Mouth: Mucous membranes are moist.     Pharynx: Oropharynx is clear. No oropharyngeal exudate or posterior oropharyngeal erythema.  Cardiovascular:     Rate and Rhythm: Normal rate and regular rhythm.     Heart sounds: No murmur heard.    No friction rub. No gallop.  Pulmonary:     Effort: Pulmonary effort is normal. No respiratory distress.     Breath sounds: Examination of the left-lower field reveals wheezing. Wheezing present. No rhonchi or rales.  Abdominal:     General: Bowel sounds are normal. There is no distension.     Palpations: Abdomen is soft. There is no mass.      Tenderness: There is no abdominal tenderness.  Musculoskeletal:        General: No swelling.     Right lower leg: No edema.     Left lower leg: No edema.  Lymphadenopathy:     Cervical: No cervical adenopathy.     Upper Body:     Right upper body: No supraclavicular or axillary adenopathy.     Left upper body: No supraclavicular or axillary adenopathy.     Lower Body: No right inguinal adenopathy. No left inguinal adenopathy.  Skin:    General: Skin is warm.     Coloration: Skin is not jaundiced.     Findings: No lesion or rash.  Neurological:     General: No focal deficit present.     Mental Status: He is alert and oriented to person, place, and time. Mental status is at baseline.  Psychiatric:        Mood and Affect: Mood normal.        Behavior: Behavior normal.        Thought Content: Thought content normal.    LABS:      Latest Ref Rng & Units 11/11/2024    9:27 AM 10/14/2024    9:13 AM 09/16/2024    9:04 AM  CBC  WBC 4.0 - 10.5 K/uL 7.7  6.1  6.8   Hemoglobin 13.0 - 17.0 g/dL 86.8  87.9  12.5  Hematocrit 39.0 - 52.0 % 39.9  37.1  38.5   Platelets 150 - 400 K/uL 208  184  193       Latest Ref Rng & Units 10/14/2024    9:13 AM 09/16/2024    9:04 AM 08/19/2024    9:24 AM  CMP  Glucose 70 - 99 mg/dL 894  67  896   BUN 8 - 23 mg/dL 14  16  17    Creatinine 0.61 - 1.24 mg/dL 9.12  8.99  8.94   Sodium 135 - 145 mmol/L 142  141  139   Potassium 3.5 - 5.1 mmol/L 3.6  4.1  3.9   Chloride 98 - 111 mmol/L 103  104  102   CO2 22 - 32 mmol/L 28  30  26    Calcium 8.9 - 10.3 mg/dL 9.6  9.9  9.8   Total Protein 6.5 - 8.1 g/dL 6.8  7.0  7.0   Total Bilirubin 0.0 - 1.2 mg/dL 0.3  0.4  0.3   Alkaline Phos 38 - 126 U/L 131  145  141   AST 15 - 41 U/L 22  20  23    ALT 0 - 44 U/L 8  7  8     ASSESSMENT & PLAN:  A 78 y.o. male with limited stage small cell lung cancer.  He will proceed with his 14th cycle of maintenance durvalumab  immunotherapy today.  Clinically, the patient  appears to be doing well.  I will see him back in 4 weeks before he heads into his 14th cycle of maintenance durvalumab  immunotherapy. The patient understands all the plans discussed today and is in agreement with them.  Valaria Kerns, MD     "

## 2024-11-11 ENCOUNTER — Telehealth: Payer: Self-pay | Admitting: Oncology

## 2024-11-11 ENCOUNTER — Inpatient Hospital Stay (HOSPITAL_BASED_OUTPATIENT_CLINIC_OR_DEPARTMENT_OTHER): Admitting: Oncology

## 2024-11-11 ENCOUNTER — Inpatient Hospital Stay: Attending: Oncology

## 2024-11-11 ENCOUNTER — Inpatient Hospital Stay

## 2024-11-11 VITALS — BP 108/62 | HR 77 | Temp 98.3°F | Resp 18 | Ht 66.0 in | Wt 177.6 lb

## 2024-11-11 DIAGNOSIS — Z79620 Long term (current) use of immunosuppressive biologic: Secondary | ICD-10-CM | POA: Diagnosis not present

## 2024-11-11 DIAGNOSIS — C3412 Malignant neoplasm of upper lobe, left bronchus or lung: Secondary | ICD-10-CM | POA: Insufficient documentation

## 2024-11-11 DIAGNOSIS — Z5112 Encounter for antineoplastic immunotherapy: Secondary | ICD-10-CM | POA: Insufficient documentation

## 2024-11-11 LAB — CBC WITH DIFFERENTIAL (CANCER CENTER ONLY)
Abs Immature Granulocytes: 0.02 K/uL (ref 0.00–0.07)
Basophils Absolute: 0 K/uL (ref 0.0–0.1)
Basophils Relative: 1 %
Eosinophils Absolute: 0.2 K/uL (ref 0.0–0.5)
Eosinophils Relative: 3 %
HCT: 39.9 % (ref 39.0–52.0)
Hemoglobin: 13.1 g/dL (ref 13.0–17.0)
Immature Granulocytes: 0 %
Lymphocytes Relative: 23 %
Lymphs Abs: 1.7 K/uL (ref 0.7–4.0)
MCH: 29.7 pg (ref 26.0–34.0)
MCHC: 32.8 g/dL (ref 30.0–36.0)
MCV: 90.5 fL (ref 80.0–100.0)
Monocytes Absolute: 0.9 K/uL (ref 0.1–1.0)
Monocytes Relative: 12 %
Neutro Abs: 4.8 K/uL (ref 1.7–7.7)
Neutrophils Relative %: 61 %
Platelet Count: 208 K/uL (ref 150–400)
RBC: 4.41 MIL/uL (ref 4.22–5.81)
RDW: 14.5 % (ref 11.5–15.5)
WBC Count: 7.7 K/uL (ref 4.0–10.5)
nRBC: 0 % (ref 0.0–0.2)

## 2024-11-11 LAB — CMP (CANCER CENTER ONLY)
ALT: 6 U/L (ref 0–44)
AST: 20 U/L (ref 15–41)
Albumin: 4.2 g/dL (ref 3.5–5.0)
Alkaline Phosphatase: 143 U/L — ABNORMAL HIGH (ref 38–126)
Anion gap: 10 (ref 5–15)
BUN: 17 mg/dL (ref 8–23)
CO2: 27 mmol/L (ref 22–32)
Calcium: 10.2 mg/dL (ref 8.9–10.3)
Chloride: 101 mmol/L (ref 98–111)
Creatinine: 1.04 mg/dL (ref 0.61–1.24)
GFR, Estimated: >60 mL/min
Glucose, Bld: 106 mg/dL — ABNORMAL HIGH (ref 70–99)
Potassium: 4.1 mmol/L (ref 3.5–5.1)
Sodium: 137 mmol/L (ref 135–145)
Total Bilirubin: 0.4 mg/dL (ref 0.0–1.2)
Total Protein: 7 g/dL (ref 6.5–8.1)

## 2024-11-11 LAB — TSH: TSH: 1.8 u[IU]/mL (ref 0.350–4.500)

## 2024-11-11 MED ORDER — SODIUM CHLORIDE 0.9 % IV SOLN
1500.0000 mg | Freq: Once | INTRAVENOUS | Status: AC
  Administered 2024-11-11: 1500 mg via INTRAVENOUS
  Filled 2024-11-11: qty 30

## 2024-11-11 MED ORDER — SODIUM CHLORIDE 0.9% FLUSH: 10.0000 mL | INTRAVENOUS | Status: DC | PRN

## 2024-11-11 MED ORDER — SODIUM CHLORIDE 0.9 % IV SOLN: INTRAVENOUS | Status: DC

## 2024-11-11 NOTE — Telephone Encounter (Signed)
 Patient has been scheduled for follow-up visit per 11/11/2024 LOS.  Pt given an appt calendar with date and time.

## 2024-11-11 NOTE — Patient Instructions (Signed)
 Durvalumab Injection What is this medication? DURVALUMAB (dur VAL ue mab) treats some types of cancer. It works by helping your immune system slow or stop the spread of cancer cells. It is a monoclonal antibody. This medicine may be used for other purposes; ask your health care provider or pharmacist if you have questions. COMMON BRAND NAME(S): IMFINZI What should I tell my care team before I take this medication? They need to know if you have any of these conditions: Allogeneic stem cell transplant (uses someone else's stem cells) Autoimmune diseases, such as Crohn disease, ulcerative colitis, lupus History of chest radiation Nervous system problems, such as Guillain-Barre syndrome, myasthenia gravis Organ transplant An unusual or allergic reaction to durvalumab, other medications, foods, dyes, or preservatives Pregnant or trying to get pregnant Breast-feeding How should I use this medication? This medication is infused into a vein. It is given by your care team in a hospital or clinic setting. A special MedGuide will be given to you before each treatment. Be sure to read this information carefully each time. Talk to your care team about the use of this medication in children. Special care may be needed. Overdosage: If you think you have taken too much of this medicine contact a poison control center or emergency room at once. NOTE: This medicine is only for you. Do not share this medicine with others. What if I miss a dose? Keep appointments for follow-up doses. It is important not to miss your dose. Call your care team if you are unable to keep an appointment. What may interact with this medication? Interactions have not been studied. This list may not describe all possible interactions. Give your health care provider a list of all the medicines, herbs, non-prescription drugs, or dietary supplements you use. Also tell them if you smoke, drink alcohol, or use illegal drugs. Some items may  interact with your medicine. What should I watch for while using this medication? Your condition will be monitored carefully while you are receiving this medication. You may need blood work while taking this medication. This medication may cause serious skin reactions. They can happen weeks to months after starting the medication. Contact your care team right away if you notice fevers or flu-like symptoms with a rash. The rash may be red or purple and then turn into blisters or peeling of the skin. You may also notice a red rash with swelling of the face, lips, or lymph nodes in your neck or under your arms. Tell your care team right away if you have any change in your eyesight. Talk to your care team if you may be pregnant. Serious birth defects can occur if you take this medication during pregnancy and for 3 months after the last dose. You will need a negative pregnancy test before starting this medication. Contraception is recommended while taking this medication and for 3 months after the last dose. Your care team can help you find the option that works for you. Do not breastfeed while taking this medication and for 3 months after the last dose. What side effects may I notice from receiving this medication? Side effects that you should report to your care team as soon as possible: Allergic reactions--skin rash, itching, hives, swelling of the face, lips, tongue, or throat Dry cough, shortness of breath or trouble breathing Eye pain, redness, irritation, or discharge with blurry or decreased vision Heart muscle inflammation--unusual weakness or fatigue, shortness of breath, chest pain, fast or irregular heartbeat, dizziness, swelling of the  ankles, feet, or hands Hormone gland problems--headache, sensitivity to light, unusual weakness or fatigue, dizziness, fast or irregular heartbeat, increased sensitivity to cold or heat, excessive sweating, constipation, hair loss, increased thirst or amount of  urine, tremors or shaking, irritability Infusion reactions--chest pain, shortness of breath or trouble breathing, feeling faint or lightheaded Kidney injury (glomerulonephritis)--decrease in the amount of urine, red or dark brown urine, foamy or bubbly urine, swelling of the ankles, hands, or feet Liver injury--right upper belly pain, loss of appetite, nausea, light-colored stool, dark yellow or brown urine, yellowing skin or eyes, unusual weakness or fatigue Pain, tingling, or numbness in the hands or feet, muscle weakness, change in vision, confusion or trouble speaking, loss of balance or coordination, trouble walking, seizures Rash, fever, and swollen lymph nodes Redness, blistering, peeling, or loosening of the skin, including inside the mouth Sudden or severe stomach pain, bloody diarrhea, fever, nausea, vomiting Side effects that usually do not require medical attention (report these to your care team if they continue or are bothersome): Bone, joint, or muscle pain Diarrhea Fatigue Loss of appetite Nausea Skin rash This list may not describe all possible side effects. Call your doctor for medical advice about side effects. You may report side effects to FDA at 1-800-FDA-1088. Where should I keep my medication? This medication is given in a hospital or clinic. It will not be stored at home. NOTE: This sheet is a summary. It may not cover all possible information. If you have questions about this medicine, talk to your doctor, pharmacist, or health care provider.  2024 Elsevier/Gold Standard (2022-03-21 00:00:00)

## 2024-11-11 NOTE — Progress Notes (Signed)
 Patient aware of delay in treatment- states understanding,

## 2024-11-12 ENCOUNTER — Other Ambulatory Visit: Payer: Self-pay

## 2024-11-12 LAB — T4: T4, Total: 8.7 ug/dL (ref 4.5–12.0)

## 2024-12-01 ENCOUNTER — Encounter: Payer: Self-pay | Admitting: Oncology

## 2024-12-08 ENCOUNTER — Encounter: Payer: Self-pay | Admitting: Hematology and Oncology

## 2024-12-09 ENCOUNTER — Inpatient Hospital Stay: Admitting: Hematology and Oncology

## 2024-12-09 ENCOUNTER — Telehealth: Payer: Self-pay | Admitting: Hematology and Oncology

## 2024-12-09 ENCOUNTER — Encounter: Payer: Self-pay | Admitting: Hematology and Oncology

## 2024-12-09 ENCOUNTER — Inpatient Hospital Stay

## 2024-12-09 ENCOUNTER — Other Ambulatory Visit: Payer: Self-pay

## 2024-12-09 ENCOUNTER — Inpatient Hospital Stay: Attending: Oncology

## 2024-12-09 VITALS — BP 109/63 | HR 62 | Resp 18

## 2024-12-09 VITALS — BP 119/62 | HR 64 | Temp 98.0°F | Resp 20 | Ht 66.0 in | Wt 181.2 lb

## 2024-12-09 DIAGNOSIS — Z79620 Long term (current) use of immunosuppressive biologic: Secondary | ICD-10-CM | POA: Diagnosis not present

## 2024-12-09 DIAGNOSIS — Z9221 Personal history of antineoplastic chemotherapy: Secondary | ICD-10-CM | POA: Insufficient documentation

## 2024-12-09 DIAGNOSIS — C3412 Malignant neoplasm of upper lobe, left bronchus or lung: Secondary | ICD-10-CM

## 2024-12-09 DIAGNOSIS — Z5112 Encounter for antineoplastic immunotherapy: Secondary | ICD-10-CM | POA: Diagnosis present

## 2024-12-09 DIAGNOSIS — Z923 Personal history of irradiation: Secondary | ICD-10-CM | POA: Diagnosis not present

## 2024-12-09 LAB — CMP (CANCER CENTER ONLY)
ALT: 8 U/L (ref 0–44)
AST: 18 U/L (ref 15–41)
Albumin: 3.8 g/dL (ref 3.5–5.0)
Alkaline Phosphatase: 137 U/L — ABNORMAL HIGH (ref 38–126)
Anion gap: 9 (ref 5–15)
BUN: 14 mg/dL (ref 8–23)
CO2: 29 mmol/L (ref 22–32)
Calcium: 9.9 mg/dL (ref 8.9–10.3)
Chloride: 102 mmol/L (ref 98–111)
Creatinine: 0.95 mg/dL (ref 0.61–1.24)
GFR, Estimated: >60 mL/min
Glucose, Bld: 127 mg/dL — ABNORMAL HIGH (ref 70–99)
Potassium: 3.5 mmol/L (ref 3.5–5.1)
Sodium: 140 mmol/L (ref 135–145)
Total Bilirubin: 0.3 mg/dL (ref 0.0–1.2)
Total Protein: 6.8 g/dL (ref 6.5–8.1)

## 2024-12-09 LAB — CBC WITH DIFFERENTIAL (CANCER CENTER ONLY)
Abs Immature Granulocytes: 0.03 K/uL (ref 0.00–0.07)
Basophils Absolute: 0.1 K/uL (ref 0.0–0.1)
Basophils Relative: 1 %
Eosinophils Absolute: 0.3 K/uL (ref 0.0–0.5)
Eosinophils Relative: 4 %
HCT: 38.7 % — ABNORMAL LOW (ref 39.0–52.0)
Hemoglobin: 12.3 g/dL — ABNORMAL LOW (ref 13.0–17.0)
Immature Granulocytes: 0 %
Lymphocytes Relative: 23 %
Lymphs Abs: 1.6 K/uL (ref 0.7–4.0)
MCH: 29.3 pg (ref 26.0–34.0)
MCHC: 31.8 g/dL (ref 30.0–36.0)
MCV: 92.1 fL (ref 80.0–100.0)
Monocytes Absolute: 0.7 K/uL (ref 0.1–1.0)
Monocytes Relative: 10 %
Neutro Abs: 4.2 K/uL (ref 1.7–7.7)
Neutrophils Relative %: 62 %
Platelet Count: 237 K/uL (ref 150–400)
RBC: 4.2 MIL/uL — ABNORMAL LOW (ref 4.22–5.81)
RDW: 13.3 % (ref 11.5–15.5)
WBC Count: 6.8 K/uL (ref 4.0–10.5)
nRBC: 0 % (ref 0.0–0.2)

## 2024-12-09 LAB — TSH: TSH: 3.55 u[IU]/mL (ref 0.350–4.500)

## 2024-12-09 MED ORDER — SODIUM CHLORIDE 0.9 % IV SOLN: INTRAVENOUS | Status: DC

## 2024-12-09 MED ORDER — SODIUM CHLORIDE 0.9 % IV SOLN
1500.0000 mg | Freq: Once | INTRAVENOUS | Status: AC
  Administered 2024-12-09: 1500 mg via INTRAVENOUS
  Filled 2024-12-09: qty 30

## 2024-12-09 NOTE — Telephone Encounter (Signed)
 Patient has been scheduled for follow-up visit per 12/09/2024 LOS.  Pt given an appt calendar with date and time.

## 2024-12-09 NOTE — Patient Instructions (Signed)
 Durvalumab Injection What is this medication? DURVALUMAB (dur VAL ue mab) treats some types of cancer. It works by helping your immune system slow or stop the spread of cancer cells. It is a monoclonal antibody. This medicine may be used for other purposes; ask your health care provider or pharmacist if you have questions. COMMON BRAND NAME(S): IMFINZI What should I tell my care team before I take this medication? They need to know if you have any of these conditions: Allogeneic stem cell transplant (uses someone else's stem cells) Autoimmune diseases, such as Crohn disease, ulcerative colitis, lupus History of chest radiation Nervous system problems, such as Guillain-Barre syndrome, myasthenia gravis Organ transplant An unusual or allergic reaction to durvalumab, other medications, foods, dyes, or preservatives Pregnant or trying to get pregnant Breast-feeding How should I use this medication? This medication is infused into a vein. It is given by your care team in a hospital or clinic setting. A special MedGuide will be given to you before each treatment. Be sure to read this information carefully each time. Talk to your care team about the use of this medication in children. Special care may be needed. Overdosage: If you think you have taken too much of this medicine contact a poison control center or emergency room at once. NOTE: This medicine is only for you. Do not share this medicine with others. What if I miss a dose? Keep appointments for follow-up doses. It is important not to miss your dose. Call your care team if you are unable to keep an appointment. What may interact with this medication? Interactions have not been studied. This list may not describe all possible interactions. Give your health care provider a list of all the medicines, herbs, non-prescription drugs, or dietary supplements you use. Also tell them if you smoke, drink alcohol, or use illegal drugs. Some items may  interact with your medicine. What should I watch for while using this medication? Your condition will be monitored carefully while you are receiving this medication. You may need blood work while taking this medication. This medication may cause serious skin reactions. They can happen weeks to months after starting the medication. Contact your care team right away if you notice fevers or flu-like symptoms with a rash. The rash may be red or purple and then turn into blisters or peeling of the skin. You may also notice a red rash with swelling of the face, lips, or lymph nodes in your neck or under your arms. Tell your care team right away if you have any change in your eyesight. Talk to your care team if you may be pregnant. Serious birth defects can occur if you take this medication during pregnancy and for 3 months after the last dose. You will need a negative pregnancy test before starting this medication. Contraception is recommended while taking this medication and for 3 months after the last dose. Your care team can help you find the option that works for you. Do not breastfeed while taking this medication and for 3 months after the last dose. What side effects may I notice from receiving this medication? Side effects that you should report to your care team as soon as possible: Allergic reactions--skin rash, itching, hives, swelling of the face, lips, tongue, or throat Dry cough, shortness of breath or trouble breathing Eye pain, redness, irritation, or discharge with blurry or decreased vision Heart muscle inflammation--unusual weakness or fatigue, shortness of breath, chest pain, fast or irregular heartbeat, dizziness, swelling of the  ankles, feet, or hands Hormone gland problems--headache, sensitivity to light, unusual weakness or fatigue, dizziness, fast or irregular heartbeat, increased sensitivity to cold or heat, excessive sweating, constipation, hair loss, increased thirst or amount of  urine, tremors or shaking, irritability Infusion reactions--chest pain, shortness of breath or trouble breathing, feeling faint or lightheaded Kidney injury (glomerulonephritis)--decrease in the amount of urine, red or dark brown urine, foamy or bubbly urine, swelling of the ankles, hands, or feet Liver injury--right upper belly pain, loss of appetite, nausea, light-colored stool, dark yellow or brown urine, yellowing skin or eyes, unusual weakness or fatigue Pain, tingling, or numbness in the hands or feet, muscle weakness, change in vision, confusion or trouble speaking, loss of balance or coordination, trouble walking, seizures Rash, fever, and swollen lymph nodes Redness, blistering, peeling, or loosening of the skin, including inside the mouth Sudden or severe stomach pain, bloody diarrhea, fever, nausea, vomiting Side effects that usually do not require medical attention (report these to your care team if they continue or are bothersome): Bone, joint, or muscle pain Diarrhea Fatigue Loss of appetite Nausea Skin rash This list may not describe all possible side effects. Call your doctor for medical advice about side effects. You may report side effects to FDA at 1-800-FDA-1088. Where should I keep my medication? This medication is given in a hospital or clinic. It will not be stored at home. NOTE: This sheet is a summary. It may not cover all possible information. If you have questions about this medicine, talk to your doctor, pharmacist, or health care provider.  2024 Elsevier/Gold Standard (2022-03-21 00:00:00)

## 2024-12-09 NOTE — Progress Notes (Cosign Needed)
 " [facility]  [address] [place],  [place]  [address] [phone]  Clinic Day:  12/09/2024  Referring physician: Thurmond Cathlyn LABOR., MD   HISTORY OF PRESENT ILLNESS:  The patient is a 79 y.o. male with limited stage small cell lung cancer.  He comes in today to be evaluated before heading into his 15th cycle of maintenance durvalumab  immunotherapy.  He initially completed definitive chemoradiation in October 2024 for his disease management.  His chemotherapy consisted of 4 cycles of cisplatin /etoposide .  Since his last visit, the patient has been doing well.  He continues to tolerate durvalumab  well. He reports stable shortness of breath with exertion. He denies having any new symptoms or findings which concern him for disease recurrence.  Of note, recent CT scans in November 2025 showed no evidence of disease recurrence.    VITALS:   Blood pressure 119/62, pulse 64, temperature 98 F (36.7 C), temperature source Oral, resp. rate 20, height 5' 6 (1.676 m), weight 181 lb 3.2 oz (82.2 kg), SpO2 97%. Wt Readings from Last 3 Encounters:  12/09/24 181 lb 3.2 oz (82.2 kg)  11/11/24 177 lb 9.6 oz (80.6 kg)  10/14/24 181 lb 8 oz (82.3 kg)   Body mass index is 29.25 kg/m.  Performance status (ECOG): 1 - Symptomatic but completely ambulatory  PHYSICAL EXAM:   Physical Exam Vitals and nursing note reviewed.  Constitutional:      General: He is not in acute distress.    Appearance: Normal appearance. He is normal weight.  HENT:     Head: Normocephalic and atraumatic.     Mouth/Throat:     Mouth: Mucous membranes are moist.     Pharynx: Oropharynx is clear. No oropharyngeal exudate or posterior oropharyngeal erythema.  Eyes:     General: No scleral icterus.    Extraocular Movements: Extraocular movements intact.     Conjunctiva/sclera: Conjunctivae normal.     Pupils: Pupils are equal, round, and reactive to light.  Cardiovascular:     Rate and Rhythm: Normal rate  and regular rhythm.     Heart sounds: Normal heart sounds. No murmur heard.    No friction rub. No gallop.  Pulmonary:     Effort: Pulmonary effort is normal.     Breath sounds: Wheezing (scattered wheezes) present. No rhonchi or rales.  Abdominal:     General: Bowel sounds are normal. There is no distension.     Palpations: Abdomen is soft. There is no mass.     Tenderness: There is no abdominal tenderness.  Musculoskeletal:        General: Normal range of motion.     Cervical back: Normal range of motion and neck supple. No tenderness.     Right lower leg: No edema.     Left lower leg: No edema.  Lymphadenopathy:     Cervical: No cervical adenopathy.  Skin:    General: Skin is warm and dry.     Coloration: Skin is not jaundiced.     Findings: No rash.  Neurological:     Mental Status: He is alert and oriented to person, place, and time.     Cranial Nerves: No cranial nerve deficit.  Psychiatric:        Mood and Affect: Mood normal.        Behavior: Behavior normal.        Thought Content: Thought content normal.      LABS:      Latest Ref  Rng & Units 12/09/2024    8:58 AM 11/11/2024    9:27 AM 10/14/2024    9:13 AM  CBC  WBC 4.0 - 10.5 K/uL 6.8  7.7  6.1   Hemoglobin 13.0 - 17.0 g/dL 87.6  86.8  87.9   Hematocrit 39.0 - 52.0 % 38.7  39.9  37.1   Platelets 150 - 400 K/uL 237  208  184       Latest Ref Rng & Units 12/09/2024    8:58 AM 11/11/2024    9:27 AM 10/14/2024    9:13 AM  CMP  Glucose 70 - 99 mg/dL 872  893  894   BUN 8 - 23 mg/dL 14  17  14    Creatinine 0.61 - 1.24 mg/dL 9.04  8.95  9.12   Sodium 135 - 145 mmol/L 140  137  142   Potassium 3.5 - 5.1 mmol/L 3.5  4.1  3.6   Chloride 98 - 111 mmol/L 102  101  103   CO2 22 - 32 mmol/L 29  27  28    Calcium 8.9 - 10.3 mg/dL 9.9  89.7  9.6   Total Protein 6.5 - 8.1 g/dL 6.8  7.0  6.8   Total Bilirubin 0.0 - 1.2 mg/dL 0.3  0.4  0.3   Alkaline Phos 38 - 126 U/L 137  143  131   AST 15 - 41 U/L 18  20  22     ALT 0 - 44 U/L 8  6  8      STUDIES:   No results found.    ASSESSMENT & PLAN:   Assessment/Plan:  79 y.o. male with limited stage small cell lung cancer.  Clinically, the patient appears to be doing fairly well. He has scattered wheezing, so I encouraged him to use his nebulized treatments.  He will proceed with his 15th cycle of maintenance durvalumab  immunotherapy today.  I will see him back in 4 weeks before he heads into his 16th cycle of maintenance durvalumab  immunotherapy. The patient understands all the plans discussed today and is in agreement with them.   The patient understands all the plans discussed today and is in agreement with them.  He knows to contact our office if he develops concerns prior to his next appointment.     Andrez DELENA Foy, PA-C   Physician Assistant [facility] [place] [phone]    "

## 2024-12-10 ENCOUNTER — Other Ambulatory Visit: Payer: Self-pay

## 2024-12-10 LAB — T4: T4, Total: 8.6 ug/dL (ref 4.5–12.0)

## 2024-12-15 ENCOUNTER — Other Ambulatory Visit: Payer: Self-pay

## 2025-01-06 ENCOUNTER — Inpatient Hospital Stay

## 2025-01-06 ENCOUNTER — Inpatient Hospital Stay: Attending: Oncology

## 2025-01-06 ENCOUNTER — Inpatient Hospital Stay: Admitting: Oncology
